# Patient Record
Sex: Female | Born: 1980 | Race: White | Hispanic: No | Marital: Single | Smoking: Former smoker
Health system: Southern US, Community
[De-identification: ages and names within clinical notes are randomized; demographics above are authoritative.]

## PROBLEM LIST (undated history)

## (undated) DIAGNOSIS — T7840XA Allergy, unspecified, initial encounter: Secondary | ICD-10-CM

## (undated) DIAGNOSIS — M4842XA Fatigue fracture of vertebra, cervical region, initial encounter for fracture: Secondary | ICD-10-CM

## (undated) DIAGNOSIS — K219 Gastro-esophageal reflux disease without esophagitis: Secondary | ICD-10-CM

## (undated) DIAGNOSIS — I059 Rheumatic mitral valve disease, unspecified: Secondary | ICD-10-CM

## (undated) DIAGNOSIS — M1990 Unspecified osteoarthritis, unspecified site: Secondary | ICD-10-CM

## (undated) DIAGNOSIS — I351 Nonrheumatic aortic (valve) insufficiency: Secondary | ICD-10-CM

## (undated) HISTORY — DX: Fatigue fracture of vertebra, cervical region, initial encounter for fracture: M48.42XA

## (undated) HISTORY — DX: Unspecified osteoarthritis, unspecified site: M19.90

## (undated) HISTORY — PX: ESSURE TUBAL LIGATION: SUR464

## (undated) HISTORY — DX: Nonrheumatic aortic (valve) insufficiency: I35.1

## (undated) HISTORY — DX: Rheumatic mitral valve disease, unspecified: I05.9

## (undated) HISTORY — DX: Gastro-esophageal reflux disease without esophagitis: K21.9

## (undated) HISTORY — PX: TUBAL LIGATION: SHX77

## (undated) HISTORY — DX: Allergy, unspecified, initial encounter: T78.40XA

---
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
## 2000-05-09 ENCOUNTER — Other Ambulatory Visit: Admission: RE | Admit: 2000-05-09 | Discharge: 2000-05-09 | Payer: Self-pay | Admitting: Obstetrics and Gynecology

## 2000-05-09 ENCOUNTER — Other Ambulatory Visit: Admission: RE | Admit: 2000-05-09 | Discharge: 2000-05-09 | Payer: Self-pay | Admitting: Gynecology

## 2000-12-08 ENCOUNTER — Inpatient Hospital Stay (HOSPITAL_COMMUNITY): Admission: AD | Admit: 2000-12-08 | Discharge: 2000-12-10 | Payer: Self-pay | Admitting: Obstetrics and Gynecology

## 2000-12-08 ENCOUNTER — Encounter (INDEPENDENT_AMBULATORY_CARE_PROVIDER_SITE_OTHER): Payer: Self-pay | Admitting: Specialist

## 2002-01-20 ENCOUNTER — Encounter: Payer: Self-pay | Admitting: Emergency Medicine

## 2002-01-20 ENCOUNTER — Emergency Department (HOSPITAL_COMMUNITY): Admission: EM | Admit: 2002-01-20 | Discharge: 2002-01-20 | Payer: Self-pay | Admitting: Emergency Medicine

## 2007-04-24 ENCOUNTER — Ambulatory Visit (HOSPITAL_COMMUNITY): Admission: RE | Admit: 2007-04-24 | Discharge: 2007-04-24 | Payer: Self-pay | Admitting: Obstetrics & Gynecology

## 2007-07-29 ENCOUNTER — Ambulatory Visit (HOSPITAL_COMMUNITY): Admission: RE | Admit: 2007-07-29 | Discharge: 2007-07-29 | Payer: Self-pay | Admitting: Obstetrics & Gynecology

## 2009-01-26 ENCOUNTER — Emergency Department (HOSPITAL_COMMUNITY): Admission: EM | Admit: 2009-01-26 | Discharge: 2009-01-26 | Payer: Self-pay | Admitting: Family Medicine

## 2009-01-30 DIAGNOSIS — E040 Nontoxic diffuse goiter: Secondary | ICD-10-CM | POA: Insufficient documentation

## 2009-01-30 DIAGNOSIS — E079 Disorder of thyroid, unspecified: Secondary | ICD-10-CM | POA: Insufficient documentation

## 2009-04-07 DIAGNOSIS — J309 Allergic rhinitis, unspecified: Secondary | ICD-10-CM | POA: Insufficient documentation

## 2009-07-28 ENCOUNTER — Emergency Department (HOSPITAL_COMMUNITY): Admission: EM | Admit: 2009-07-28 | Discharge: 2009-07-28 | Payer: Self-pay | Admitting: Family Medicine

## 2009-08-23 ENCOUNTER — Emergency Department (HOSPITAL_BASED_OUTPATIENT_CLINIC_OR_DEPARTMENT_OTHER): Admission: EM | Admit: 2009-08-23 | Discharge: 2009-08-23 | Payer: Self-pay | Admitting: Emergency Medicine

## 2009-10-16 ENCOUNTER — Emergency Department (HOSPITAL_COMMUNITY): Admission: EM | Admit: 2009-10-16 | Discharge: 2009-10-16 | Payer: Self-pay | Admitting: Emergency Medicine

## 2009-10-24 ENCOUNTER — Emergency Department (HOSPITAL_COMMUNITY): Admission: EM | Admit: 2009-10-24 | Discharge: 2009-10-24 | Payer: Self-pay | Admitting: Family Medicine

## 2009-11-29 ENCOUNTER — Ambulatory Visit: Payer: Self-pay | Admitting: Diagnostic Radiology

## 2009-11-29 ENCOUNTER — Emergency Department (HOSPITAL_BASED_OUTPATIENT_CLINIC_OR_DEPARTMENT_OTHER): Admission: EM | Admit: 2009-11-29 | Discharge: 2009-11-29 | Payer: Self-pay | Admitting: Emergency Medicine

## 2009-12-10 ENCOUNTER — Emergency Department (HOSPITAL_BASED_OUTPATIENT_CLINIC_OR_DEPARTMENT_OTHER): Admission: EM | Admit: 2009-12-10 | Discharge: 2009-12-10 | Payer: Self-pay | Admitting: Emergency Medicine

## 2009-12-24 ENCOUNTER — Emergency Department (HOSPITAL_COMMUNITY): Admission: EM | Admit: 2009-12-24 | Discharge: 2009-12-24 | Payer: Self-pay | Admitting: Family Medicine

## 2010-02-04 ENCOUNTER — Emergency Department (HOSPITAL_BASED_OUTPATIENT_CLINIC_OR_DEPARTMENT_OTHER): Admission: EM | Admit: 2010-02-04 | Discharge: 2010-02-04 | Payer: Self-pay | Admitting: Emergency Medicine

## 2010-02-24 ENCOUNTER — Emergency Department (HOSPITAL_COMMUNITY): Admission: EM | Admit: 2010-02-24 | Discharge: 2010-02-24 | Payer: Self-pay | Admitting: Family Medicine

## 2010-03-30 ENCOUNTER — Emergency Department (HOSPITAL_COMMUNITY): Admission: EM | Admit: 2010-03-30 | Discharge: 2010-03-30 | Payer: Self-pay | Admitting: Family Medicine

## 2010-04-03 ENCOUNTER — Emergency Department (HOSPITAL_COMMUNITY): Admission: EM | Admit: 2010-04-03 | Discharge: 2010-04-03 | Payer: Self-pay | Admitting: Family Medicine

## 2010-10-26 ENCOUNTER — Emergency Department (HOSPITAL_COMMUNITY)
Admission: EM | Admit: 2010-10-26 | Discharge: 2010-10-26 | Payer: Self-pay | Source: Home / Self Care | Admitting: Emergency Medicine

## 2010-11-17 ENCOUNTER — Emergency Department (HOSPITAL_BASED_OUTPATIENT_CLINIC_OR_DEPARTMENT_OTHER)
Admission: EM | Admit: 2010-11-17 | Discharge: 2010-11-17 | Payer: Self-pay | Source: Home / Self Care | Admitting: Emergency Medicine

## 2011-01-09 ENCOUNTER — Emergency Department (HOSPITAL_BASED_OUTPATIENT_CLINIC_OR_DEPARTMENT_OTHER)
Admission: EM | Admit: 2011-01-09 | Discharge: 2011-01-09 | Disposition: A | Discharge status: Home / Self Care | Payer: Self-pay | Attending: Emergency Medicine | Admitting: Emergency Medicine

## 2011-01-09 DIAGNOSIS — G8929 Other chronic pain: Secondary | ICD-10-CM | POA: Insufficient documentation

## 2011-01-09 DIAGNOSIS — Y92410 Unspecified street and highway as the place of occurrence of the external cause: Secondary | ICD-10-CM | POA: Insufficient documentation

## 2011-01-09 DIAGNOSIS — S335XXA Sprain of ligaments of lumbar spine, initial encounter: Secondary | ICD-10-CM | POA: Insufficient documentation

## 2011-03-26 NOTE — Op Note (Signed)
Kristina Delacruz, Kristina Delacruz NO.:  [id number]   MEDICAL RECORD NO.:  [id number]          PATIENT TYPE:  AMB   LOCATION:  SDC                           FACILITY:  [facility]   PHYSICIAN:  Roseanna Rainbow, M.D.DATE OF BIRTH:  05-06-1981   DATE OF PROCEDURE:  04/24/2007  DATE OF DISCHARGE:                               OPERATIVE REPORT   PREOPERATIVE DIAGNOSIS:  Multiparity, desires sterilization procedure.   POSTOPERATIVE DIAGNOSIS:  Multiparity, desires sterilization procedure.   PROCEDURE:  Essure placement.   SURGEON:  Jackson-Moore.   ANESTHESIA:  Laryngeal mask airway.   ESTIMATED BLOOD LOSS:  Minimal.   COMPLICATIONS:  None.   PROCEDURE:  The patient was taken to the operating room with an IV  running.  A laryngeal mask airway was placed without difficulty.  She  was then placed in the dorsal lithotomy position and prepped and draped  in the usual sterile fashion.  A bivalve speculum was then placed into  the vagina.  The anterior lip of the cervix was grasped with a single-  tooth tenaculum.  The cervix was then dilated with [facility] dilators.  The  operative scope was then introduced into the cervix and then advanced to  the uterine fundus using normal saline as the distending medium.  The  tubal ostia were visualized bilaterally.  The left ostia was felt to be  the most difficult one to cannulate, and Essure device was then placed  in the left tube without difficulty.  After placement, four coils were  present.  The device was then removed.  The Essure was then placed in  the right tube again without difficulty.  After placement, three coils  were noted at the tubal ostium.  The hysteroscope was then removed.  The  tenaculum was then removed with minimal bleeding noted from the cervix.  At the close of the procedure, the instrument and pack counts were said  to be correct x2.  The patient was taken to the PACU awake and in stable  condition.      Roseanna Rainbow, M.D.  Electronically Signed     LAJ/MEDQ  D:  04/24/2007  T:  04/24/2007  Job:  [id number]

## 2011-03-29 NOTE — Discharge Summary (Signed)
[facility]  Patient:    Kristina Delacruz, Kristina Delacruz                     MRN: [id number] Adm. Date:  [id number] Disc. Date: [id number] Attending:  Conley Simmonds A Dictator:   Leilani Able, P.A.                           Discharge Summary  FINAL DIAGNOSES:              Intrauterine pregnancy at 40 5/[redacted] weeks gestation, pregnancy induced hypertension, moderate meconium stained amniotic fluid, nonreassuring fetal assessment.  PROCEDURE:                    Vacuum assisted vaginal delivery with episiotomy and repair.  SURGEON:                      Dr. Conley Simmonds.  COMPLICATIONS:                None.  HISTORY:                      This 30 year old G1, P0 was admitted at 85 5/[redacted] weeks gestation in active labor.  Patient had been followed for some elevated blood pressures.  Patients nonstress test had been reactive but when she was seen in the office today her nonstress was nonreactive.  A BPP was performed and showed to be 6/8 with an AFI of 5.  Patient was noted to be contracting every five to six minutes at this point and her cervix was only about 1 cm dilated.  She was admitted, underwent AROM, and thick meconium stained fluid was noted.  Patients contraction pattern was not adequate and therefore Pitocin augmentation was begun.  She did start to develop some mild to moderate variable decelerations which were treated with amnio-infusion. Patient progressed to complete and complete and started developing severe variable decelerations.  Because of this decision was made to proceed with a vacuum assisted delivery.  A vacuum extractor was applied.  Patient had delivery with the third contraction.  There was also a midline episiotomy performed.  Patient had the delivery of a 6 pound 5 ounce female infant with Apgars of 7 and 9.  The delivery was performed by Dr. Conley Simmonds.  There was no meconium noted below the cord.  There was a small infarction noted along the  periphery of the placenta and this was sent to pathology.  Patients episiotomy was repaired and the procedure went without complication. Patients postpartum course was benign without significant fever.  She was felt ready for discharge on postpartum day #2.  Was sent home on a regular diet.  Told to decrease activities.  Was given Nu-Iron 150 to take one q.d., Tylox #30 one to two q.4h. as needed for pain.  Told she could use over-the-counter medicine.  Follow-up in four to six weeks. DD:  01/02/01 TD:  01/02/01 Job: [id number]

## 2011-03-29 NOTE — Op Note (Signed)
[facility]  Patient:    Kristina Delacruz, Kristina Delacruz                     MRN: [id number] Proc. Date: 12/08/00 Adm. Date:  [id number] Attending:  Conley Simmonds A                           Operative Report  PREOPERATIVE DIAGNOSES:       1. Intrauterine gestation at 40+5 weeks.                               2. Pregnancy induced hypertension.                               3. Moderate meconium-stained amniotic fluid.                               4. Nonreassuring fetal assessment.  POSTOPERATIVE DIAGNOSES:      1. Intrauterine gestation at 40+5 weeks.                               2. Pregnancy induced hypertension.                               3. Moderate meconium-stained amniotic fluid.                               4. Nonreassuring fetal assessment.  PROCEDURE:                    Vacuum-assisted vaginal delivery with episiotomy                               and repair.  SURGEON:                      Brook A. Edward Jolly, M.D.  ANESTHESIA:                   Epidural, local with 1% lidocaine.  ESTIMATED BLOOD LOSS:         300 cc.  COMPLICATIONS:                None.  INDICATIONS:                  The patient was a 30 year old gravida 1, para 0, admitted at 40+[redacted] weeks gestation in early labor. The patient had been followed for labile hypertension, and she was scheduled for a nonstress test on the day of her admission to the hospital. The nonstress test was noted to be nonreactive, and a biophysical profile was noted to be 6/8 with an amniotic fluid index of 5.0. The patient was noted to be contracting approximately every 5 to 6 minutes, and her cervix was 1 cm dilated and 30% effaced. She was admitted to the hospital for early labor. The patient underwent artificial rupture of membranes when her cervix was 3 to 4 cm dilated, and moderately thick meconium-stained amniotic fluid was noted.  The fetal heart tracing upon admission was  reassuring at this time.  The  patients contractions became inadequate, and Pitocin augmentation was begun. The patient did develop mild to moderate variable decelerations which were treated with an amnioinfusion during the course of her labor. The patient rapidly progressed to complete cervical dilatation and developed severe variable decelerations of the fetal heart rate tracing. At this time, a decision was made to proceed with a vacuum-assisted vaginal delivery.  FINDINGS:                     A viable female infant was delivered at 2008 with a tight nuchal cord x 1. The Apgars were 7 at one minute and 9 at five minutes. No meconium-stained fluid was noted below the infants vocal cords. The placenta was spontaneously delivered and was intact with a normal insertion of a three-vessel cord. There was an infarction noted along the periphery of the placenta which represented less than 1/8 of the placenta. The cord pH was noted to 7.19.  DESCRIPTION OF PROCEDURE:     The patient had a Foley catheter previously placed inside her bladder which had emptied the bladder. This was removed, and the cervix was examined. There was complete dilatation with a vertex in the OA position at the +2 station. A recommendation was made to place the Mityvac to assist with delivery due to the severe nature of the variable decelerations. The Mityvac was applied, and proper pressure was applied. Local 1% lidocaine was administered to the perineum and a midline episiotomy was performed. A viable female was then delivered with the third contraction. The nuchal cord was reduced. The nares and mouth were suctioned with the DeLee suction, and the remainder of the infant was delivered.  The cord was doubly clamped and cut, and the infant was carried over to the awaiting pediatricians. No gross abnormalities were appreciated.  The placenta was spontaneously delivered at this time. The cervix and vagina were examined and there was no evidence of any  lacerations. The episiotomy was repaired in standard fashion with 2-0 Vicryl. The patient did receive Pitocin 20 units intravenously after delivery of the placenta. There were no complications to the procedure. All needle, instrument, and sponge counts were correct. DD:  12/08/00 TD:  12/08/00 Job: [id number]

## 2011-05-21 ENCOUNTER — Encounter: Payer: Self-pay | Admitting: *Deleted

## 2011-05-21 ENCOUNTER — Emergency Department (HOSPITAL_BASED_OUTPATIENT_CLINIC_OR_DEPARTMENT_OTHER)
Admission: EM | Admit: 2011-05-21 | Discharge: 2011-05-21 | Disposition: A | Discharge status: Home / Self Care | Payer: Self-pay | Attending: Emergency Medicine | Admitting: Emergency Medicine

## 2011-05-21 DIAGNOSIS — R21 Rash and other nonspecific skin eruption: Secondary | ICD-10-CM

## 2011-05-21 MED ORDER — PERMETHRIN 5 % EX CREA: TOPICAL_CREAM | CUTANEOUS | Status: AC

## 2011-05-21 MED ORDER — PREDNISONE 20 MG PO TABS: 40.0000 mg | ORAL_TABLET | Freq: Every day | ORAL | Status: AC

## 2011-05-21 NOTE — ED Provider Notes (Signed)
History     Chief Complaint  Patient presents with  . Rash   Patient is a 30 y.o. female presenting with rash. The history is provided by the patient.  Rash  This is a new problem. Episode onset: 3 weeks ago. The problem has not changed since onset.The problem is associated with an unknown factor. There has been no fever. The rash is present on the right arm and left arm. The pain is at a severity of 0/10. The patient is experiencing no pain. Associated symptoms include itching. Pertinent negatives include no blisters, no pain and no weeping. She has tried antihistamines and OTC analgesics for the symptoms. The treatment provided no relief. Risk factors: no new exposure except for a new dog.    History reviewed. No pertinent past medical history.  Past Surgical History  Procedure Date  . Tubal ligation     No family history on file.  History  Substance Use Topics  . Smoking status: Former Games developer  . Smokeless tobacco: Never Used  . Alcohol Use: No    OB History    Grav Para Term Preterm Abortions TAB SAB Ect Mult Living                  Review of Systems  Skin: Positive for itching and rash.  All other systems reviewed and are negative.    Physical Exam  BP 115/73  Pulse 107  Temp(Src) 98.2 F (36.8 C) (Oral)  Resp 22  SpO2 100%  Physical Exam  Constitutional: She appears well-developed and well-nourished. No distress.  HENT:  Head: Normocephalic and atraumatic.  Eyes: Pupils are equal, round, and reactive to light.  Skin: Skin is warm, dry and intact. Rash noted. Rash is maculopapular.       Circular lesions diffusely on bilateral upper ext with excoriation but without any drainage or induration.  No involvement of the web spaces or axilla or trunk    ED Course  Procedures  MDM Pt with rash most consistent with mite or other contact reaction. Only on the upper arms and no signs of bacterial infection.  Will give steroids and continue benadryl.  Will have  use premethrin.     Gwyneth Sprout, MD 05/21/11 [phone]

## 2011-05-21 NOTE — ED Notes (Signed)
Rash over her body x 3 weeks. Started after getting a new puppy. Itching has gotten worse with time.

## 2011-08-29 LAB — CBC
HCT: 39.6
Hemoglobin: 13.6
RBC: 4.44
RDW: 12.4

## 2012-06-09 ENCOUNTER — Emergency Department (HOSPITAL_COMMUNITY)
Admission: EM | Admit: 2012-06-09 | Discharge: 2012-06-09 | Disposition: A | Discharge status: Home / Self Care | Payer: Self-pay | Attending: Emergency Medicine | Admitting: Emergency Medicine

## 2012-06-09 ENCOUNTER — Encounter (HOSPITAL_COMMUNITY): Payer: Self-pay | Admitting: *Deleted

## 2012-06-09 DIAGNOSIS — H9209 Otalgia, unspecified ear: Secondary | ICD-10-CM | POA: Insufficient documentation

## 2012-06-09 DIAGNOSIS — H9202 Otalgia, left ear: Secondary | ICD-10-CM

## 2012-06-09 DIAGNOSIS — Z87891 Personal history of nicotine dependence: Secondary | ICD-10-CM | POA: Insufficient documentation

## 2012-06-09 MED ORDER — MECLIZINE HCL 50 MG PO TABS: 25.0000 mg | ORAL_TABLET | Freq: Three times a day (TID) | ORAL | Status: AC | PRN

## 2012-06-09 MED ORDER — AMOXICILLIN 250 MG PO CAPS: 500.0000 mg | ORAL_CAPSULE | Freq: Two times a day (BID) | ORAL | Status: AC

## 2012-06-09 NOTE — ED Provider Notes (Signed)
History     CSN: [id number]  Arrival date & time 06/09/12  1540   First MD Initiated Contact with Patient 06/09/12 1623      Chief Complaint  Patient presents with  . Otalgia    LT side x 2 weeks.  Has been using ear drops w/o relief  . Ear Fullness    LT side    (Consider location/radiation/quality/duration/timing/severity/associated sxs/prior treatment) HPI  31 year old female presents complaining of ear pain. Patient reports she is having pain to the left ear for 2 weeks. She described a dull sensation to her left ear, with decreased hearing. Symptom is persistent, unrelieved with over-the-counter ear wax removal solution, or ear drops for  pain. She noticed improvement when she lays on the affected side. She does endorse dizziness for the past several days. She denies fever, chills, headache, neck pain, sneezing, coughing, sore throat, or rash. She denies any recent trauma. Does admits to swimming in pool twice but her sxs has started before that.    No past medical history on file.  Past Surgical History  Procedure Date  . Tubal ligation     No family history on file.  History  Substance Use Topics  . Smoking status: Former Games developer  . Smokeless tobacco: Never Used  . Alcohol Use: No    OB History    Grav Para Term Preterm Abortions TAB SAB Ect Mult Living                  Review of Systems  Constitutional: Negative for fever.  HENT: Positive for hearing loss and ear pain. Negative for congestion, sore throat, rhinorrhea, sneezing, neck pain, postnasal drip, sinus pressure, tinnitus and ear discharge.   Skin: Negative for rash.  Neurological: Negative for numbness.  All other systems reviewed and are negative.    Allergies  Review of patient's allergies indicates no known allergies.  Home Medications  No current outpatient prescriptions on file.  There were no vitals taken for this visit.  Physical Exam  Nursing note and vitals  reviewed. Constitutional: She is oriented to person, place, and time. She appears well-developed and well-nourished.  HENT:  Right Ear: External ear and ear canal normal. No drainage.  Left Ear: External ear and ear canal normal. No drainage.  Ears:  Eyes: Conjunctivae are normal.       No nystagmus.   Neck: Normal range of motion. Neck supple.  Musculoskeletal: Normal range of motion.  Lymphadenopathy:    She has no cervical adenopathy.  Neurological: She is alert and oriented to person, place, and time. No cranial nerve deficit. Coordination normal.  Skin: Skin is warm. No rash noted.  Psychiatric: She has a normal mood and affect.    ED Course  Procedures (including critical care time)  Labs Reviewed - No data to display No results found.   No diagnosis found.  1. Ear pain, left  MDM  Cerumen impaction on both ear L>R.  Sxs unrelieved with OTC wax removal and ear drops.  Due to duration of 2 weeks, will prescribe abx and referral to ENT.  I do not believe ear irrigation at this time is beneficial.    Doubt otitis externa.    BP 121/91  Pulse 74  Temp 97.7 F (36.5 C) (Oral)  Resp 14  SpO2 100%     Fayrene Helper, PA-C 06/09/12 1636

## 2012-06-09 NOTE — ED Notes (Signed)
Pt reports L ear pain x2weeks. Has tried ear drops and mineral oil without relief. Reports unable to hear out of L ear. Pain has become pressure. Worse with movement, makes pt dizzy.

## 2012-06-09 NOTE — ED Provider Notes (Signed)
Medical screening examination/treatment/procedure(s) were performed by non-physician practitioner and as supervising physician I was immediately available for consultation/collaboration.   Blimy Napoleon Y. Albion Weatherholtz, MD 06/09/12 2356 

## 2012-06-11 ENCOUNTER — Emergency Department (HOSPITAL_BASED_OUTPATIENT_CLINIC_OR_DEPARTMENT_OTHER)
Admission: EM | Admit: 2012-06-11 | Discharge: 2012-06-11 | Disposition: A | Discharge status: Home / Self Care | Payer: Self-pay | Attending: Emergency Medicine | Admitting: Emergency Medicine

## 2012-06-11 ENCOUNTER — Encounter (HOSPITAL_BASED_OUTPATIENT_CLINIC_OR_DEPARTMENT_OTHER): Payer: Self-pay

## 2012-06-11 DIAGNOSIS — H6120 Impacted cerumen, unspecified ear: Secondary | ICD-10-CM | POA: Insufficient documentation

## 2012-06-11 MED ORDER — CIPROFLOXACIN-DEXAMETHASONE 0.3-0.1 % OT SUSP: 4.0000 [drp] | Freq: Two times a day (BID) | OTIC | Status: AC

## 2012-06-11 NOTE — ED Notes (Signed)
MD at bedside. 

## 2012-06-11 NOTE — ED Provider Notes (Signed)
History     CSN: [id number]  Arrival date & time 06/11/12  0840   First MD Initiated Contact with Patient 06/11/12 [phone]      Chief Complaint  Patient presents with  . Otalgia    (Consider location/radiation/quality/duration/timing/severity/associated sxs/prior treatment) HPI Comments: Left ear pain with fullness and decreased hearing for the past 2 weeks. Seen 2 days ago [facility] and put on amoxicillin. Denies fever, vomiting, dizziness, lightheadedness, chest pain or shortness of breath. Using debrox without relief.   The history is provided by the patient.    History reviewed. No pertinent past medical history.  Past Surgical History  Procedure Date  . Tubal ligation     No family history on file.  History  Substance Use Topics  . Smoking status: Former Games developer  . Smokeless tobacco: Never Used  . Alcohol Use: No    OB History    Grav Para Term Preterm Abortions TAB SAB Ect Mult Living                  Review of Systems  Constitutional: Negative for fever, activity change and appetite change.  HENT: Positive for hearing loss and ear pain. Negative for congestion, sore throat and rhinorrhea.   Respiratory: Negative for cough and shortness of breath.   Cardiovascular: Negative for chest pain.  Gastrointestinal: Negative for nausea, vomiting and abdominal pain.  Genitourinary: Negative for dysuria and hematuria.  Musculoskeletal: Negative for back pain.    Allergies  Review of patient's allergies indicates no known allergies.  Home Medications   Current Outpatient Rx  Name Route Sig Dispense Refill  . AMOXICILLIN 250 MG PO CAPS Oral Take 2 capsules (500 mg total) by mouth 2 (two) times daily. 28 capsule 0  . CARBAMIDE PEROXIDE 6.5 % OT SOLN Left Ear Place 5-10 drops into the left ear 2 (two) times daily.    Marland Kitchen HOMEOPATHIC PRODUCTS OT SOLN Left Ear Place 3-4 drops into the left ear 4 (four) times daily as needed. Ear pain. Homeopathic ear pain drops. Marland Kitchen    Marti Sleigh HCL 50 MG PO TABS Oral Take 0.5 tablets (25 mg total) by mouth 3 (three) times daily as needed for dizziness. 15 tablet 0  . MINERAL OIL PO OIL Left Ear Place 30 mLs into the left ear daily as needed. Ear pain.      BP 120/86  Pulse 88  Temp 97.6 F (36.4 C) (Oral)  Resp 16  Ht 5' (1.524 m)  Wt 115 lb (52.164 kg)  BMI 22.46 kg/m2  SpO2 100%  Physical Exam  Constitutional: She is oriented to person, place, and time. She appears well-developed and well-nourished. No distress.  HENT:  Head: Normocephalic and atraumatic.  Mouth/Throat: Oropharynx is clear and moist. No oropharyngeal exudate.       Bilateral cerumen impaction, TMs not visible. No tragus or mastoid pain  Eyes: Conjunctivae and EOM are normal. Pupils are equal, round, and reactive to light.  Neck: Normal range of motion. Neck supple.  Cardiovascular: Normal rate, regular rhythm and normal heart sounds.   No murmur heard. Pulmonary/Chest: Effort normal and breath sounds normal. No respiratory distress.  Abdominal: Soft. There is no tenderness. There is no rebound and no guarding.  Neurological: She is alert and oriented to person, place, and time. No cranial nerve deficit. Coordination normal.       5 out of 5 strength throughout, cranial nerves 3-12 intact, no nystagmus, no ataxia finger to nose, visual fields  full to confrontation  Skin: Skin is warm.    ED Course  EAR CERUMEN REMOVAL Date/Time: 06/11/2012 9:28 AM Performed by: Glynn Octave Authorized by: Glynn Octave Consent: Verbal consent obtained. Risks and benefits: risks, benefits and alternatives were discussed Consent given by: patient Patient identity confirmed: verbally with patient Local anesthetic: none Location details: right ear Procedure type: curette Patient sedated: no Patient tolerance: Patient tolerated the procedure well with no immediate complications.   (including critical care time)  Labs Reviewed - No data to  display No results found.   No diagnosis found.    MDM  Cerumen impaction. No signs of infection.  Ear wax removal performed by nursing staff with irrigation. Some trauma noted to ear canal bilaterally but TMs intact.  Residual cerumen on R removed by myself with curette.  Patient reports improvement in pain and hearing. D/c PO antibiotics. Add topical abx, followup ENT.       Glynn Octave, MD 06/11/12 [phone]

## 2012-06-11 NOTE — ED Notes (Signed)
Pt reports left ear pain.  She was seen Monday at [facility] and started on PO Abx.

## 2012-08-30 ENCOUNTER — Encounter (HOSPITAL_COMMUNITY): Payer: Self-pay | Admitting: Emergency Medicine

## 2012-08-30 ENCOUNTER — Emergency Department (INDEPENDENT_AMBULATORY_CARE_PROVIDER_SITE_OTHER)
Admission: EM | Admit: 2012-08-30 | Discharge: 2012-08-30 | Disposition: A | Discharge status: Home / Self Care | Payer: Self-pay | Source: Home / Self Care | Attending: Family Medicine | Admitting: Family Medicine

## 2012-08-30 DIAGNOSIS — H109 Unspecified conjunctivitis: Secondary | ICD-10-CM

## 2012-08-30 MED ORDER — MOXIFLOXACIN HCL 0.5 % OP SOLN: 1.0000 [drp] | Freq: Three times a day (TID) | OPHTHALMIC | Status: DC

## 2012-08-30 NOTE — ED Provider Notes (Signed)
History     CSN: [id number]  Arrival date & time 08/30/12  1152   First MD Initiated Contact with Patient 08/30/12 1220      Chief Complaint  Patient presents with  . Eye Pain    (Consider location/radiation/quality/duration/timing/severity/associated sxs/prior treatment) Patient is a 31 y.o. female presenting with eye pain. The history is provided by the patient.  Eye Pain This is a new problem. The current episode started 2 days ago. The problem has not changed since onset.Associated symptoms comments: Tearing, no photophobia, wears contacts, recently changed 4 days prior.Marland Kitchen    History reviewed. No pertinent past medical history.  Past Surgical History  Procedure Date  . Tubal ligation     No family history on file.  History  Substance Use Topics  . Smoking status: Former Games developer  . Smokeless tobacco: Never Used  . Alcohol Use: No    OB History    Grav Para Term Preterm Abortions TAB SAB Ect Mult Living                  Review of Systems  Constitutional: Negative.   HENT: Negative.   Eyes: Positive for pain and redness. Negative for photophobia, discharge, itching and visual disturbance.    Allergies  Review of patient's allergies indicates no known allergies.  Home Medications   Current Outpatient Rx  Name Route Sig Dispense Refill  . DIPHENHYDRAMINE HCL 25 MG PO TABS Oral Take 25 mg by mouth every 6 (six) hours as needed.    . CARBAMIDE PEROXIDE 6.5 % OT SOLN Left Ear Place 5-10 drops into the left ear 2 (two) times daily.    Marland Kitchen HOMEOPATHIC PRODUCTS OT SOLN Left Ear Place 3-4 drops into the left ear 4 (four) times daily as needed. Ear pain. Homeopathic ear pain drops. .    . MINERAL OIL PO OIL Left Ear Place 30 mLs into the left ear daily as needed. Ear pain.    Marland Kitchen MOXIFLOXACIN HCL 0.5 % OP SOLN Left Eye Place 1 drop into the left eye 3 (three) times daily. For 5 days. 3 mL 0    BP 134/94  Pulse 90  Temp 99.2 F (37.3 C) (Oral)  Resp 18  SpO2  100%  Physical Exam  Nursing note and vitals reviewed. Constitutional: She is oriented to person, place, and time. She appears well-developed and well-nourished.  HENT:  Head: Normocephalic.  Right Ear: External ear normal.  Left Ear: External ear normal.  Mouth/Throat: Oropharynx is clear and moist.  Eyes: EOM and lids are normal. Pupils are equal, round, and reactive to light. No foreign bodies found. Right eye exhibits no discharge. Left eye exhibits no discharge. Right conjunctiva is not injected. Right conjunctiva has no hemorrhage. Left conjunctiva is injected. Left conjunctiva has no hemorrhage.  Fundoscopic exam:      The left eye shows no arteriolar narrowing, no AV nicking, no exudate and no hemorrhage. The left eye shows venous pulsations. Slit lamp exam:      The left eye shows no corneal abrasion, no corneal flare, no corneal ulcer, no foreign body, no hyphema and no fluorescein uptake.  Neck: Normal range of motion. Neck supple.  Lymphadenopathy:    She has no cervical adenopathy.  Neurological: She is alert and oriented to person, place, and time.  Skin: Skin is warm and dry.    ED Course  Procedures (including critical care time)  Labs Reviewed - No data to display No results found.  1. Conjunctivitis of left eye       MDM          Linna Hoff, MD 08/30/12 1334

## 2012-08-30 NOTE — ED Notes (Signed)
Eye irritation, swelling, pain.  Onset Friday of swelling , thought this was related to allergies.  Patient has intermittently attempted use of contacts, but results in eye symptoms worsening and patient removes contact.  Left eye with redness, pain, swollen lids, vision worse than usual without corrective lenses.

## 2013-05-10 ENCOUNTER — Emergency Department (INDEPENDENT_AMBULATORY_CARE_PROVIDER_SITE_OTHER)
Admission: EM | Admit: 2013-05-10 | Discharge: 2013-05-10 | Disposition: A | Discharge status: Home / Self Care | Payer: Self-pay | Source: Home / Self Care | Attending: Emergency Medicine | Admitting: Emergency Medicine

## 2013-05-10 ENCOUNTER — Encounter (HOSPITAL_COMMUNITY): Payer: Self-pay | Admitting: Emergency Medicine

## 2013-05-10 DIAGNOSIS — N390 Urinary tract infection, site not specified: Secondary | ICD-10-CM

## 2013-05-10 DIAGNOSIS — H9202 Otalgia, left ear: Secondary | ICD-10-CM

## 2013-05-10 DIAGNOSIS — H9209 Otalgia, unspecified ear: Secondary | ICD-10-CM

## 2013-05-10 LAB — POCT URINALYSIS DIP (DEVICE)
Bilirubin Urine: NEGATIVE
Glucose, UA: NEGATIVE mg/dL
Nitrite: NEGATIVE
Specific Gravity, Urine: 1.01 (ref 1.005–1.030)
pH: 6 (ref 5.0–8.0)

## 2013-05-10 MED ORDER — CEPHALEXIN 500 MG PO CAPS: 500.0000 mg | ORAL_CAPSULE | Freq: Three times a day (TID) | ORAL | Status: DC

## 2013-05-10 NOTE — ED Provider Notes (Signed)
Goes up and goes and he'll hear well  History    CSN: [id number] Arrival date & time 05/10/13  1154  First MD Initiated Contact with Patient 05/10/13 1307     Chief Complaint  Patient presents with  . Urinary Tract Infection  . Otalgia   (Consider location/radiation/quality/duration/timing/severity/associated sxs/prior Treatment) HPI Comments: Patient presents urgent care describing pressure burning with urination since Thursday. She denies any fevers, nausea vomiting pelvic pain or flank pain. She has not taken anything over-the-counter for her symptoms. " It burns to pee"...    Patient also describes that for about a year she has been having intermittent left-sided ear pains that come and go at times for weeks at times for days that suddenly associated with loss of hearing. She has not seek medical attention for it she has not tried any medicines over-the-counter. Denies nausea, vertigo, headaches.   " I came to this urgent care Center about a year ago and a irrigated my ear is a headache ear wax in my problems diet happening after that".....  Patient is a 32 y.o. female presenting with urinary tract infection and ear pain. The history is provided by the patient.  Urinary Tract Infection This is a new problem. The current episode started more than 2 days ago. The problem occurs constantly. The problem has not changed since onset.Pertinent negatives include no abdominal pain and no shortness of breath. Exacerbated by: urination. Nothing relieves the symptoms. The treatment provided no relief.  Otalgia Associated symptoms: no abdominal pain, no ear discharge and no rash    No past medical history on file. Past Surgical History  Procedure Laterality Date  . Tubal ligation     No family history on file. History  Substance Use Topics  . Smoking status: Former Games developer  . Smokeless tobacco: Never Used  . Alcohol Use: No   OB History   Grav Para Term Preterm Abortions TAB SAB Ect  Mult Living                 Review of Systems  Constitutional: Negative for activity change and appetite change.  HENT: Positive for ear pain. Negative for neck stiffness and ear discharge.   Respiratory: Negative for shortness of breath.   Gastrointestinal: Negative for abdominal pain.  Skin: Negative for pallor, rash and wound.    Allergies  Review of patient's allergies indicates no known allergies.  Home Medications   Current Outpatient Rx  Name  Route  Sig  Dispense  Refill  . carbamide peroxide (DEBROX) 6.5 % otic solution   Left Ear   Place 5-10 drops into the left ear 2 (two) times daily.         . diphenhydrAMINE (BENADRYL) 25 MG tablet   Oral   Take 25 mg by mouth every 6 (six) hours as needed.         . Homeopathic Products SOLN   Left Ear   Place 3-4 drops into the left ear 4 (four) times daily as needed. Ear pain. Homeopathic ear pain drops. .         . mineral oil liquid   Left Ear   Place 30 mLs into the left ear daily as needed. Ear pain.         Marland Kitchen moxifloxacin (VIGAMOX) 0.5 % ophthalmic solution   Left Eye   Place 1 drop into the left eye 3 (three) times daily. For 5 days.   3 mL   0    BP  138/89  Pulse 107  Resp 16  SpO2 99% Physical Exam  Nursing note and vitals reviewed. Constitutional: She appears well-developed and well-nourished.  Non-toxic appearance.  HENT:  Head: Normocephalic.  Right Ear: Hearing, tympanic membrane and ear canal normal. No tenderness. No middle ear effusion.  Left Ear: Hearing, tympanic membrane and ear canal normal. No tenderness.  No middle ear effusion.  Ears:  Mouth/Throat: Uvula is midline. No oropharyngeal exudate.  Eyes: Conjunctivae are normal. Right eye exhibits no discharge. Left eye exhibits no discharge. No scleral icterus.  Neck: Neck supple.  Abdominal: There is no tenderness.  Lymphadenopathy:    She has no cervical adenopathy.  Neurological: She is alert.  Skin: No rash noted. No  erythema. No pallor.    ED Course  Procedures (including critical care time) Labs Reviewed  POCT URINALYSIS DIP (DEVICE) - Abnormal; Notable for the following:    Hgb urine dipstick MODERATE (*)    Leukocytes, UA SMALL (*)    All other components within normal limits  URINE CULTURE  POCT PREGNANCY, URINE   No results found. 1. Urinary tract infection   2. Otalgia of left ear     MDM  Problem #1 uncomplicated urinary tract infection. patient looks comfortable. He urine culture has been ordered patient will be started on Keflex for 7 days.   Problem #2 chronic left ear pain (recurrent- intermittent associated intermittent hearing loss for one year.  Ear exam was unremarkable. Patient has been recommended to followup with the ENT clinic/ written information provided for local clinic. Patient is asymptomatic at time of exam.   Jimmie Molly, MD 05/10/13 1335

## 2013-05-10 NOTE — ED Notes (Signed)
Complaining of ear ache and uti.   States left ear has been bothering for a month now.  Symptom is only pain.     Complaining of urinary problems since Thursday last week.  Symptom is burning.

## 2013-05-11 LAB — URINE CULTURE
Colony Count: NO GROWTH
Culture: NO GROWTH

## 2013-05-14 ENCOUNTER — Emergency Department (INDEPENDENT_AMBULATORY_CARE_PROVIDER_SITE_OTHER)
Admission: EM | Admit: 2013-05-14 | Discharge: 2013-05-14 | Disposition: A | Discharge status: Home / Self Care | Payer: Self-pay | Source: Home / Self Care | Attending: Family Medicine | Admitting: Family Medicine

## 2013-05-14 ENCOUNTER — Encounter (HOSPITAL_COMMUNITY): Payer: Self-pay | Admitting: Emergency Medicine

## 2013-05-14 ENCOUNTER — Other Ambulatory Visit (HOSPITAL_COMMUNITY)
Admission: RE | Admit: 2013-05-14 | Discharge: 2013-05-14 | Disposition: A | Discharge status: Home / Self Care | Payer: Self-pay | Source: Ambulatory Visit | Attending: Family Medicine | Admitting: Family Medicine

## 2013-05-14 DIAGNOSIS — J310 Chronic rhinitis: Secondary | ICD-10-CM

## 2013-05-14 DIAGNOSIS — N760 Acute vaginitis: Secondary | ICD-10-CM | POA: Insufficient documentation

## 2013-05-14 DIAGNOSIS — Z113 Encounter for screening for infections with a predominantly sexual mode of transmission: Secondary | ICD-10-CM | POA: Insufficient documentation

## 2013-05-14 MED ORDER — IBUPROFEN 600 MG PO TABS: 600.0000 mg | ORAL_TABLET | Freq: Three times a day (TID) | ORAL | Status: DC | PRN

## 2013-05-14 MED ORDER — TRAMADOL HCL 50 MG PO TABS: 50.0000 mg | ORAL_TABLET | Freq: Four times a day (QID) | ORAL | Status: DC | PRN

## 2013-05-14 MED ORDER — FLUCONAZOLE 150 MG PO TABS: ORAL_TABLET | ORAL | Status: DC

## 2013-05-14 MED ORDER — PREDNISONE 20 MG PO TABS: ORAL_TABLET | ORAL | Status: DC

## 2013-05-14 MED ORDER — CETIRIZINE-PSEUDOEPHEDRINE ER 5-120 MG PO TB12: 1.0000 | ORAL_TABLET | Freq: Two times a day (BID) | ORAL | Status: DC | PRN

## 2013-05-14 NOTE — ED Provider Notes (Signed)
History    CSN: [id number] Arrival date & time 05/14/13  1191  First MD Initiated Contact with Patient 05/14/13 0920     Chief Complaint  Patient presents with  . Headache   (Consider location/radiation/quality/duration/timing/severity/associated sxs/prior Treatment) HPI Comments: 32 year old female G1 P1 here complaining of burning on urination and headache for about 2 weeks. She was seen here on June 30 with dysuria. Was treated with Keflex and had a urine culture with no growth. Patient states her symptoms.improved but now she has observed blisters in her vaginal and vulvar area and the discomfort on urination return. She denies fever or chills. She denies prior history of herpes. She denies pelvic pain or vaginal discharge other than what is usual for her. She had a bilateral tubal ligation for contraception in 2007. Denies nausea vomiting or diarrhea. Patient reports she has chronic allergic rhinitis and has had intermittent headaches and migraines in the past. Headaches are worse in the last week. Almost daily and lasting for most part of the day She's taking her antihistamine daily but still feels congested and reports sinus pressure. Denies rhinorrhea or cough. No shortness of breath or chest pain. Appetite and energy level are good.   History reviewed. No pertinent past medical history. Past Surgical History  Procedure Laterality Date  . Tubal ligation     No family history on file. History  Substance Use Topics  . Smoking status: Former Games developer  . Smokeless tobacco: Never Used  . Alcohol Use: Yes   OB History   Grav Para Term Preterm Abortions TAB SAB Ect Mult Living                 Review of Systems  Constitutional: Negative for fever, chills, activity change and appetite change.  HENT: Positive for congestion, sneezing, postnasal drip and sinus pressure. Negative for ear pain, sore throat and trouble swallowing.   Eyes: Negative for pain and discharge.  Respiratory:  Negative for cough, shortness of breath and wheezing.   Genitourinary: Positive for genital sores. Negative for hematuria, flank pain, vaginal bleeding and pelvic pain.       Burning in vulvar area after voiding as per HPI  Musculoskeletal: Negative for myalgias and arthralgias.  Skin: Negative for rash.  Neurological: Positive for headaches. Negative for dizziness, tremors, seizures, syncope, weakness and numbness.    Allergies  Review of patient's allergies indicates no known allergies.  Home Medications   Current Outpatient Rx  Name  Route  Sig  Dispense  Refill  . cetirizine-pseudoephedrine (ZYRTEC-D) 5-120 MG per tablet   Oral   Take 1 tablet by mouth 2 (two) times daily as needed for allergies or rhinitis.   30 tablet   0   . fluconazole (DIFLUCAN) 150 MG tablet      1 tab by mouth q. 72 hours x4   4 tablet   0   . Homeopathic Products SOLN   Left Ear   Place 3-4 drops into the left ear 4 (four) times daily as needed. Ear pain. Homeopathic ear pain drops. .         . ibuprofen (ADVIL,MOTRIN) 600 MG tablet   Oral   Take 1 tablet (600 mg total) by mouth every 8 (eight) hours as needed for pain.   30 tablet   0   . predniSONE (DELTASONE) 20 MG tablet      2 tabs by mouth daily for 5 days   10 tablet   0   .  traMADol (ULTRAM) 50 MG tablet   Oral   Take 1 tablet (50 mg total) by mouth every 6 (six) hours as needed for pain.   15 tablet   0    BP 109/68  Pulse 90  Temp(Src) 98.3 F (36.8 C) (Oral)  Resp 16  SpO2 100%  LMP 05/07/2013 Physical Exam  Nursing note and vitals reviewed. Constitutional: She is oriented to person, place, and time. She appears well-developed and well-nourished. No distress.  HENT:  Head: Normocephalic and atraumatic.  Nasal Congestion with erythema and swelling of nasal turbinates, no rhinorrhea. Nasal voice. Clear Post nasal drip. Bilateral maxillary sinus tenderness with palpation. no pharyngeal erythema no exudates. No uvula  deviation. No trismus. TM's clear fluid behind no dull erythema swelling or bulging.  Eyes: EOM are normal. Pupils are equal, round, and reactive to light. Right eye exhibits no discharge. Left eye exhibits no discharge. No scleral icterus.  Bilateral mild conjunctival erythema.   Neck: Neck supple.  Cardiovascular: Normal heart sounds.   Pulmonary/Chest: Effort normal and breath sounds normal.  Abdominal: Soft. Bowel sounds are normal. She exhibits no distension and no mass. There is no tenderness. There is no rebound and no guarding.  Genitourinary: There is rash on the right labia. There is rash and lesion on the left labia. There is erythema around the vagina. Vaginal discharge found.  Lymphadenopathy:    She has no cervical adenopathy.       Right: No inguinal adenopathy present.       Left: No inguinal adenopathy present.  Neurological: She is alert and oriented to person, place, and time.  Skin: She is not diaphoretic.  See GU exam    ED Course  Procedures (including critical care time) Labs Reviewed  HERPES SIMPLEX VIRUS CULTURE  CERVICOVAGINAL ANCILLARY ONLY   No results found. 1. Chronic rhinitis   2. Vaginitis     MDM  #1 vaginitis: Treated for possible Candida infection with Diflucan. HSV, GC Chlamydia and affirm test pending at the time of discharge. #2 acute and chronic rhinosinusitis: Impress noninfectious currently. Prescribe prednisone as patient does not have insurance to cover for a nasal steroid. Continue cetirizine with pseudoephedrine. Also prescribed ibuprofen and tramadol for pain Supportive care and red flags that should prompt return to medical attention discussed with patient and provided in writing.  Sharin Grave, MD 05/15/13 1624

## 2013-05-14 NOTE — ED Notes (Signed)
Printed urine culture results form Monday and gave to dr Alfonse Ras with explanation, did not obtain urine today.  Patient instructed to put on gown.

## 2013-05-14 NOTE — ED Notes (Signed)
C/o headache and painful urination- same as on Monday visit 6/30.  During the week felt like painful urination resolved, but today this symptoms has reoccurred.  Patient new symptom of blisters on labia per patient.

## 2013-05-18 NOTE — ED Notes (Signed)
GC/Chlamydia neg., Affirm: Candida and Gardnerella neg., Trich pos., Herpes culture: Herpes Simplex Type 2 detected.  Message sent to Dr. Tressia Danas for orders. Vassie Moselle 05/18/2013

## 2013-05-20 MED ORDER — ACYCLOVIR 400 MG PO TABS: 400.0000 mg | ORAL_TABLET | Freq: Three times a day (TID) | ORAL | Status: DC

## 2013-05-20 MED ORDER — METRONIDAZOLE 500 MG PO TABS: 500.0000 mg | ORAL_TABLET | Freq: Two times a day (BID) | ORAL | Status: DC

## 2013-05-21 ENCOUNTER — Telehealth (HOSPITAL_COMMUNITY): Payer: Self-pay | Admitting: *Deleted

## 2013-05-21 NOTE — ED Notes (Addendum)
7/10 Dr. Alfonse Ras did order for Acyclovir and Flagyl. 7/11  I called pt. and left a message to call.  Call 1.  Pt. called back 15 min. later.  Pt. verified x 2 and given results.  Pt. told she needs Acyclovir for Herpes and Flagyl for Trich and the Rx.'s are at the [facility] on [address].   Pt. instructed to no alcohol while taking the Flagyl, to notify her partner to be treated with Flagyl, no sex until you have finished your medication and your partner has been treated and to practice safe sex,( for the Trich.).  For the Herpes:  Pt. instructed to notify her partner. You can pass the virus even when you don't have an outbreak, so always practice safe sex, and get treated for each outbreak with Acyclovir or Valtrex. You may want to get an OB-GYN doctor or PCP who can call in a prescription for you when you have an outbreak or give you a 1 yr Rx. to fill as needed. They can also give you a Rx. if you are having frequent outbreaks.  Pt. voiced understanding. Kristina Delacruz 05/21/2013

## 2013-10-21 ENCOUNTER — Encounter (HOSPITAL_COMMUNITY): Payer: Self-pay | Admitting: Emergency Medicine

## 2013-10-21 ENCOUNTER — Emergency Department (INDEPENDENT_AMBULATORY_CARE_PROVIDER_SITE_OTHER)
Admission: EM | Admit: 2013-10-21 | Discharge: 2013-10-21 | Disposition: A | Discharge status: Home / Self Care | Payer: Self-pay | Source: Home / Self Care

## 2013-10-21 DIAGNOSIS — A6000 Herpesviral infection of urogenital system, unspecified: Secondary | ICD-10-CM

## 2013-10-21 MED ORDER — ACYCLOVIR 800 MG PO TABS: 800.0000 mg | ORAL_TABLET | Freq: Two times a day (BID) | ORAL | Status: DC

## 2013-10-21 NOTE — ED Notes (Signed)
Herpes .  Reports she needs med refill

## 2013-10-21 NOTE — ED Provider Notes (Signed)
Medical screening examination/treatment/procedure(s) were performed by non-physician practitioner and as supervising physician I was immediately available for consultation/collaboration.  Leslee Home, M.D.  Reuben Likes, MD 10/21/13 [phone]

## 2013-10-21 NOTE — ED Provider Notes (Signed)
CSN: [id number]     Arrival date & time 10/21/13  1100 History   First MD Initiated Contact with Patient 10/21/13 1123     Chief Complaint  Patient presents with  . Herpes Zoster   (Consider location/radiation/quality/duration/timing/severity/associated sxs/prior Treatment) HPI Comments: 32 year old female has a history of genital herpes which was diagnosed here at the urgent care a few months ago. She states she is having outbreak nail and describes the lesions as vesicular and red. They are tender, painful and burning. The first one started approximately one week ago. She has irregular cycles and her last menstrual period was the end of November. She is currently in midcycle and she has had minor bleeding for the past 2-3 days. She states the volume is gradually decreasing. She had a tubal ligation in 2007. She denies pelvic pain or other discomfort.    History reviewed. No pertinent past medical history. Past Surgical History  Procedure Laterality Date  . Tubal ligation     History reviewed. No pertinent family history. History  Substance Use Topics  . Smoking status: Former Games developer  . Smokeless tobacco: Never Used  . Alcohol Use: Yes   OB History   Grav Para Term Preterm Abortions TAB SAB Ect Mult Living                 Review of Systems  Constitutional: Negative.   Respiratory: Negative.   Cardiovascular: Negative.   Gastrointestinal: Negative.   Genitourinary: Positive for genital sores. Negative for dysuria, urgency, flank pain, vaginal discharge and pelvic pain.  Musculoskeletal: Negative.     Allergies  Review of patient's allergies indicates no known allergies.  Home Medications   Current Outpatient Rx  Name  Route  Sig  Dispense  Refill  . acyclovir (ZOVIRAX) 400 MG tablet   Oral   Take 1 tablet (400 mg total) by mouth 3 (three) times daily.   30 tablet   0   . acyclovir (ZOVIRAX) 800 MG tablet   Oral   Take 1 tablet (800 mg total) by mouth 2 (two) times  daily. X 5 days   10 tablet   1   . cetirizine-pseudoephedrine (ZYRTEC-D) 5-120 MG per tablet   Oral   Take 1 tablet by mouth 2 (two) times daily as needed for allergies or rhinitis.   30 tablet   0   . fluconazole (DIFLUCAN) 150 MG tablet      1 tab by mouth q. 72 hours x4   4 tablet   0   . Homeopathic Products SOLN   Left Ear   Place 3-4 drops into the left ear 4 (four) times daily as needed. Ear pain. Homeopathic ear pain drops. .         . ibuprofen (ADVIL,MOTRIN) 600 MG tablet   Oral   Take 1 tablet (600 mg total) by mouth every 8 (eight) hours as needed for pain.   30 tablet   0   . metroNIDAZOLE (FLAGYL) 500 MG tablet   Oral   Take 1 tablet (500 mg total) by mouth 2 (two) times daily.   14 tablet   0   . predniSONE (DELTASONE) 20 MG tablet      2 tabs by mouth daily for 5 days   10 tablet   0   . traMADol (ULTRAM) 50 MG tablet   Oral   Take 1 tablet (50 mg total) by mouth every 6 (six) hours as needed for pain.   15 tablet  0    BP 146/89  Pulse 87  Temp(Src) 98.1 F (36.7 C) (Oral)  Resp 20  SpO2 100%  LMP 10/08/2013 Physical Exam  Nursing note and vitals reviewed. Constitutional: She is oriented to person, place, and time. She appears well-developed and well-nourished. No distress.  Eyes: Conjunctivae and EOM are normal.  Neck: Normal range of motion. Neck supple.  Cardiovascular: Normal rate.   Pulmonary/Chest: Effort normal. No respiratory distress.  Abdominal: Soft. She exhibits no distension and no mass. There is no tenderness. There is no rebound and no guarding.  No tenderness with palpation across the anterior pelvis.  Musculoskeletal: She exhibits no edema and no tenderness.  Neurological: She is alert and oriented to person, place, and time.  Skin: Skin is warm and dry.  Psychiatric: She has a normal mood and affect.    ED Course  Procedures (including critical care time) Labs Review Labs Reviewed - No data to  display Imaging Review No results found.    MDM   1. 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

## 2014-01-03 ENCOUNTER — Emergency Department (INDEPENDENT_AMBULATORY_CARE_PROVIDER_SITE_OTHER): Payer: Self-pay

## 2014-01-03 ENCOUNTER — Encounter (HOSPITAL_COMMUNITY): Payer: Self-pay | Admitting: Emergency Medicine

## 2014-01-03 ENCOUNTER — Emergency Department (INDEPENDENT_AMBULATORY_CARE_PROVIDER_SITE_OTHER)
Admission: EM | Admit: 2014-01-03 | Discharge: 2014-01-03 | Disposition: A | Discharge status: Home / Self Care | Payer: Self-pay | Source: Home / Self Care | Attending: Emergency Medicine | Admitting: Emergency Medicine

## 2014-01-03 DIAGNOSIS — A6000 Herpesviral infection of urogenital system, unspecified: Secondary | ICD-10-CM

## 2014-01-03 DIAGNOSIS — J111 Influenza due to unidentified influenza virus with other respiratory manifestations: Secondary | ICD-10-CM

## 2014-01-03 DIAGNOSIS — R69 Illness, unspecified: Principal | ICD-10-CM

## 2014-01-03 LAB — POCT RAPID STREP A: Streptococcus, Group A Screen (Direct): NEGATIVE

## 2014-01-03 MED ORDER — GUAIFENESIN-CODEINE 100-10 MG/5ML PO SYRP: 10.0000 mL | ORAL_SOLUTION | Freq: Four times a day (QID) | ORAL | Status: DC | PRN

## 2014-01-03 MED ORDER — OSELTAMIVIR PHOSPHATE 75 MG PO CAPS: 75.0000 mg | ORAL_CAPSULE | Freq: Two times a day (BID) | ORAL | Status: DC

## 2014-01-03 MED ORDER — IPRATROPIUM BROMIDE 0.06 % NA SOLN: 2.0000 | Freq: Four times a day (QID) | NASAL | Status: DC

## 2014-01-03 NOTE — ED Provider Notes (Signed)
Chief Complaint   Chief Complaint  Patient presents with  . URI    History of Present Illness   Kristina Delacruz is a 33 year old female who has had a two-day history of low-grade fever of up to 100.3, chills, sweats, dry cough, chest tightness, feels lightheaded, had a sore throat, nasal congestion with clear rhinorrhea, headache, and sinus pressure. She denies any GI symptoms. No suspicious sick exposures.  Review of Systems   Other than as noted above, the patient denies any of the following symptoms: Systemic:  No fevers, chills, sweats, or myalgias. Eye:  No redness or discharge. ENT:  No ear pain, headache, nasal congestion, drainage, sinus pressure, or sore throat. Neck:  No neck pain, stiffness, or swollen glands. Lungs:  No cough, sputum production, hemoptysis, wheezing, chest tightness, shortness of breath or chest pain. GI:  No abdominal pain, nausea, vomiting or diarrhea.  PMFSH   Past medical history, family history, social history, meds, and allergies were reviewed.   Physical exam   Vital signs:  BP 131/76  Pulse 108  Temp(Src) 98.2 F (36.8 C) (Oral)  Resp 20  SpO2 100%  LMP 12/31/2013 General:  Alert and oriented.  In no distress.  Skin warm and dry. Eye:  No conjunctival injection or drainage. Lids were normal. ENT:  TMs and canals were normal, without erythema or inflammation.  Nasal mucosa was clear and uncongested, without drainage.  Mucous membranes were moist.  Pharynx was clear with no exudate or drainage.  There were no oral ulcerations or lesions. Neck:  Supple, no adenopathy, tenderness or mass. Lungs:  No respiratory distress.  Lungs were clear to auscultation, without wheezes, rales or rhonchi.  Breath sounds were clear and equal bilaterally.  Heart:  Regular rhythm, without gallops, murmers or rubs. Skin:  Clear, warm, and dry, without rash or lesions.  Labs   Results for orders placed during the hospital encounter of 01/03/14  POCT RAPID  STREP A ([facility] ONLY)      Result Value Ref Range   Streptococcus, Group A Screen (Direct) NEGATIVE  NEGATIVE     Radiology   Dg Chest 2 View  01/03/2014   CLINICAL DATA:  Fever and cough for 2 days  EXAM: CHEST  2 VIEW  COMPARISON:  None.  FINDINGS: The heart size and mediastinal contours are within normal limits. Both lungs are clear. The visualized skeletal structures are unremarkable.  IMPRESSION: No active cardiopulmonary disease.   Electronically Signed   By: Salome HolmesHector  Cooper M.D.   On: 01/03/2014 09:43   Assessment     The encounter diagnosis was Influenza-like illness.  Plan    1.  Meds:  The following meds were prescribed:   Discharge Medication List as of 01/03/2014 10:01 AM    START taking these medications   Details  guaiFENesin-codeine (GUIATUSS AC) 100-10 MG/5ML syrup Take 10 mLs by mouth 4 (four) times daily as needed for cough., Starting 01/03/2014, Until Discontinued, Print    ipratropium (ATROVENT) 0.06 % nasal spray Place 2 sprays into both nostrils 4 (four) times daily., Starting 01/03/2014, Until Discontinued, Normal    oseltamivir (TAMIFLU) 75 MG capsule Take 1 capsule (75 mg total) by mouth every 12 (twelve) hours., Starting 01/03/2014, Until Discontinued, Normal        2.  Patient Education/Counseling:  The patient was given appropriate handouts, self care instructions, and instructed in symptomatic relief.  Instructed to get extra fluids, rest, and use a cool mist vaporizer.  3.  Follow up:  The patient was told to follow up here if no better in 3 to 4 days, or sooner if becoming worse in any way, and given some red flag symptoms such as increasing fever, difficulty breathing, chest pain, or persistent vomiting which would prompt immediate return.  Follow up here as needed.      Reuben Likes, MD 01/03/14 [phone]

## 2014-01-03 NOTE — Discharge Instructions (Signed)
Most upper respiratory infections are caused by viruses and do not require antibiotics.  We try to save the antibiotics for when we really need them to prevent bacteria from developing resistance to them.  Here are a few hints about things that can be done at home to help get over an upper respiratory infection quicker: ° °Get extra sleep and extra fluids.  Get 7 to 9 hours of sleep per night and 6 to 8 glasses of water a day.  Getting extra sleep keeps the immune system from getting run down.  Most people with an upper respiratory infection are a little dehydrated.  The extra fluids also keep the secretions liquified and easier to deal with.  Also, get extra vitamin C.  4000 mg per day is the recommended dose. °For the aches, headache, and fever, acetaminophen or ibuprofen are helpful.  These can be alternated every 4 hours.  People with liver disease should avoid large amounts of acetaminophen, and people with ulcer disease, gastroesophageal reflux, gastritis, congestive heart failure, chronic kidney disease, coronary artery disease and the elderly should avoid ibuprofen. °For nasal congestion try Mucinex-D, or if you're having lots of sneezing or clear nasal drainage use Zyrtec-D. People with high blood pressure can take these if their blood pressure is controlled, if not, it's best to avoid the forms with a "D" (decongestants).  You can use the plain Mucinex, Allegra, Claritin, or Zyrtec even if your blood pressure is not controlled.   °A Saline nasal spray such as Ocean Spray can also help.  You can add a decongestant sprays such as Afrin, but you should not use the decongestant sprays for more than 3 or 4 days since they can be habituating.  Breathe Rite nasal strips can also offer a non-drug alternative treatment to nasal congestion, especially at night. °For people with symptoms of sinusitis, sleeping with your head elevated can be helpful.  For sinus pain, moist, hot compresses to the face may provide some  relief.  Many people find that inhaling steam as in a shower or from a pot of steaming water can help. °For any viral infection, zinc containing lozenges such as Cold-Eze or Zicam are helpful.  Zinc helps to fight viral infection.  Hot salt water gargles (8 oz of hot water, 1/2 tsp of table salt, and a pinch of baking soda) can give relief as well as hot beverages such as hot tea.  Sucrets extra strength lozenges will help the sore throat.  °For the cough, take Delsym 2 tsp every 12 hours.  It has also been found recently that Aleve can help control a cough.  The dose is 1 to 2 tablets twice daily with food.  This can be combined with Delsym. (Note, if you are taking ibuprofen, you should not take Aleve as well--take one or the other.) °A cool mist vaporizer will help keep your mucous membranes from drying out.  ° °It's important when you have an upper respiratory infection not to pass the infection to others.  This involves being very careful about the following: ° °Frequent hand washing or use of hand sanitizer, especially after coughing, sneezing, blowing your nose or touching your face, nose or eyes. °Do not shake hands or touch anyone and try to avoid touching surfaces that other people use such as doorknobs, shopping carts, telephones and computer keyboards. °Use tissues and dispose of them properly in a garbage can or ziplock bag. °Cough into your sleeve. °Do not let others eat or   drink after you. ° °It's also important to recognize the signs of serious illness and get evaluated if they occur: °Any respiratory infection that lasts more than 7 to 10 days.  Yellow nasal drainage and sputum are not reliable indicators of a bacterial infection, but if they last for more than 1 week, see your doctor. °Fever and sore throat can indicate strep. °Fever and cough can indicate influenza or pneumonia. °Any kind of severe symptom such as difficulty breathing, intractable vomiting, or severe pain should prompt you to see  a doctor as soon as possible. ° ° °Your body's immune system is really the thing that will get rid of this infection.  Your immune system is comprised of 2 types of specialized cells called T cells and B cells.  T cells coordinate the array of cells in your body that engulf invading bacteria or viruses while B cells orchestrate the production of antibodies that neutralize infection.  Anything we do or any medications we give you, will just strengthen your immune system or help it clear up the infection quicker.  Here are a few helpful hints to improve your immune system to help overcome this illness or to prevent future infections: °· A few vitamins can improve the health of your immune system.  That's why your diet should include plenty of fruits, vegetables, fish, nuts, and whole grains. °· Vitamin A and bet-carotene can increase the cells that fight infections (T cells and B cells).  Vitamin A is abundant in dark greens and orange vegetables such as spinach, greens, sweet potatoes, and carrots. °· Vitamin B6 contributes to the maturation of white blood cells, the cells that fight disease.  Foods with vitamin B6 include cold cereal and bananas. °· Vitamin C is credited with preventing colds because it increases white blood cells and also prevents cellular damage.  Citrus fruits, peaches and green and red bell peppers are all hight in vitamin C. °· Vitamin E is an anti-oxidant that encourages the production of natural killer cells which reject foreign invaders and B cells that produce antibodies.  Foods high in vitamin E include wheat germ, nuts and seeds. °· Foods high in omega-3 fatty acids found in foods like salmon, tuna and mackerel boost your immune system and help cells to engulf and absorb germs. °· Probiotics are good bacteria that increase your T cells.  These can be found in yogurt and are available in supplements such as Culturelle or Align. °· Moderate exercise increases the strength of your immune  system and your ability to recover from illness.  I suggest 3 to 5 moderate intensity 30 minute workouts per week.   °· Sleep is another component of maintaining a strong immune system.  It enables your body to recuperate from the day's activities, stress and work.  My recommendation is to get between 7 and 9 hours of sleep per night. °· If you smoke, try to quit completely or at least cut down.  Drink alcohol only in moderation if at all.  No more than 2 drinks daily for men or 1 for women. °· Get a flu vaccine early in the fall or if you have not gotten one yet, once this illness has run its course.  If you are over 65, a smoker, or an asthmatic, get a pneumococcal vaccine. °· My final recommendation is to maintain a healthy weight.  Excess weight can impair the immune system by interfering with the way the immune system deals with invading viruses or   bacteria. ° ° ° °Influenza, Adult °Influenza ("the flu") is a viral infection of the respiratory tract. It occurs more often in winter months because people spend more time in close contact with one another. Influenza can make you feel very sick. Influenza easily spreads from person to person (contagious). °CAUSES  °Influenza is caused by a virus that infects the respiratory tract. You can catch the virus by breathing in droplets from an infected person's cough or sneeze. You can also catch the virus by touching something that was recently contaminated with the virus and then touching your mouth, nose, or eyes. °SYMPTOMS  °Symptoms typically last 4 to 10 days and may include: °· Fever. °· Chills. °· Headache, body aches, and muscle aches. °· Sore throat. °· Chest discomfort and cough. °· Poor appetite. °· Weakness or feeling tired. °· Dizziness. °· Nausea or vomiting. °DIAGNOSIS  °Diagnosis of influenza is often made based on your history and a physical exam. A nose or throat swab test can be done to confirm the diagnosis. °RISKS AND COMPLICATIONS °You may be at risk  for a more severe case of influenza if you smoke cigarettes, have diabetes, have chronic heart disease (such as heart failure) or lung disease (such as asthma), or if you have a weakened immune system. Elderly people and pregnant women are also at risk for more serious infections. The most common complication of influenza is a lung infection (pneumonia). Sometimes, this complication can require emergency medical care and may be life-threatening. °PREVENTION  °An annual influenza vaccination (flu shot) is the best way to avoid getting influenza. An annual flu shot is now routinely recommended for all adults in the [place] °TREATMENT  °In mild cases, influenza goes away on its own. Treatment is directed at relieving symptoms. For more severe cases, your caregiver may prescribe antiviral medicines to shorten the sickness. Antibiotic medicines are not effective, because the infection is caused by a virus, not by bacteria. °HOME CARE INSTRUCTIONS °· Only take over-the-counter or prescription medicines for pain, discomfort, or fever as directed by your caregiver. °· Use a cool mist humidifier to make breathing easier. °· Get plenty of rest until your temperature returns to normal. This usually takes 3 to 4 days. °· Drink enough fluids to keep your urine clear or pale yellow. °· Cover your mouth and nose when coughing or sneezing, and wash your hands well to avoid spreading the virus. °· Stay home from work or school until your fever has been gone for at least 1 full day. °SEEK MEDICAL CARE IF:  °· You have chest pain or a deep cough that worsens or produces more mucus. °· You have nausea, vomiting, or diarrhea. °SEEK IMMEDIATE MEDICAL CARE IF:  °· You have difficulty breathing, shortness of breath, or your skin or nails turn bluish. °· You have severe neck pain or stiffness. °· You have a severe headache, facial pain, or earache. °· You have a worsening or recurring fever. °· You have nausea or vomiting that cannot be  controlled. °MAKE SURE YOU: °· Understand these instructions. °· Will watch your condition. °· Will get help right away if you are not doing well or get worse. °Document Released: 10/25/2000 Document Revised: 04/28/2012 Document Reviewed: 01/27/2012 °[employer]® Patient Information ©2014 [employer]. ° °

## 2014-01-03 NOTE — ED Notes (Signed)
C/o  Fever.  Productive cough with green/yellow sputum..  Sore throat.  No relief with otc sinus meds.  On set yesterday.   Denies n/v/d

## 2014-01-05 LAB — CULTURE, GROUP A STREP

## 2015-01-08 ENCOUNTER — Emergency Department (HOSPITAL_BASED_OUTPATIENT_CLINIC_OR_DEPARTMENT_OTHER): Payer: No Typology Code available for payment source

## 2015-01-08 ENCOUNTER — Emergency Department (HOSPITAL_BASED_OUTPATIENT_CLINIC_OR_DEPARTMENT_OTHER)
Admission: EM | Admit: 2015-01-08 | Discharge: 2015-01-08 | Disposition: A | Discharge status: Home / Self Care | Payer: Self-pay | Attending: Emergency Medicine | Admitting: Emergency Medicine

## 2015-01-08 ENCOUNTER — Encounter (HOSPITAL_BASED_OUTPATIENT_CLINIC_OR_DEPARTMENT_OTHER): Payer: Self-pay | Admitting: *Deleted

## 2015-01-08 ENCOUNTER — Emergency Department (HOSPITAL_BASED_OUTPATIENT_CLINIC_OR_DEPARTMENT_OTHER): Payer: Self-pay

## 2015-01-08 DIAGNOSIS — Z7952 Long term (current) use of systemic steroids: Secondary | ICD-10-CM | POA: Insufficient documentation

## 2015-01-08 DIAGNOSIS — Y998 Other external cause status: Secondary | ICD-10-CM | POA: Insufficient documentation

## 2015-01-08 DIAGNOSIS — Y9389 Activity, other specified: Secondary | ICD-10-CM | POA: Insufficient documentation

## 2015-01-08 DIAGNOSIS — Z23 Encounter for immunization: Secondary | ICD-10-CM | POA: Insufficient documentation

## 2015-01-08 DIAGNOSIS — Z79899 Other long term (current) drug therapy: Secondary | ICD-10-CM | POA: Insufficient documentation

## 2015-01-08 DIAGNOSIS — Z3202 Encounter for pregnancy test, result negative: Secondary | ICD-10-CM | POA: Insufficient documentation

## 2015-01-08 DIAGNOSIS — Y92410 Unspecified street and highway as the place of occurrence of the external cause: Secondary | ICD-10-CM | POA: Insufficient documentation

## 2015-01-08 DIAGNOSIS — Z87891 Personal history of nicotine dependence: Secondary | ICD-10-CM | POA: Insufficient documentation

## 2015-01-08 DIAGNOSIS — Z792 Long term (current) use of antibiotics: Secondary | ICD-10-CM | POA: Insufficient documentation

## 2015-01-08 DIAGNOSIS — Z791 Long term (current) use of non-steroidal anti-inflammatories (NSAID): Secondary | ICD-10-CM | POA: Insufficient documentation

## 2015-01-08 DIAGNOSIS — S3991XA Unspecified injury of abdomen, initial encounter: Secondary | ICD-10-CM | POA: Insufficient documentation

## 2015-01-08 DIAGNOSIS — A599 Trichomoniasis, unspecified: Secondary | ICD-10-CM | POA: Insufficient documentation

## 2015-01-08 LAB — CBC WITH DIFFERENTIAL/PLATELET
Basophils Absolute: 0 10*3/uL (ref 0.0–0.1)
Basophils Relative: 0 % (ref 0–1)
EOS PCT: 0 % (ref 0–5)
Eosinophils Absolute: 0 10*3/uL (ref 0.0–0.7)
HCT: 38.8 % (ref 36.0–46.0)
Hemoglobin: 13.5 g/dL (ref 12.0–15.0)
LYMPHS ABS: 2 10*3/uL (ref 0.7–4.0)
LYMPHS PCT: 19 % (ref 12–46)
MCH: 31.3 pg (ref 26.0–34.0)
MCHC: 34.8 g/dL (ref 30.0–36.0)
MCV: 89.8 fL (ref 78.0–100.0)
Monocytes Absolute: 0.7 10*3/uL (ref 0.1–1.0)
Monocytes Relative: 6 % (ref 3–12)
NEUTROS ABS: 7.5 10*3/uL (ref 1.7–7.7)
Neutrophils Relative %: 75 % (ref 43–77)
Platelets: 237 10*3/uL (ref 150–400)
RBC: 4.32 MIL/uL (ref 3.87–5.11)
RDW: 12.1 % (ref 11.5–15.5)
WBC: 10.2 10*3/uL (ref 4.0–10.5)

## 2015-01-08 LAB — URINALYSIS, ROUTINE W REFLEX MICROSCOPIC
Bilirubin Urine: NEGATIVE
GLUCOSE, UA: NEGATIVE mg/dL
HGB URINE DIPSTICK: NEGATIVE
Ketones, ur: NEGATIVE mg/dL
Nitrite: NEGATIVE
PH: 5.5 (ref 5.0–8.0)
PROTEIN: NEGATIVE mg/dL
SPECIFIC GRAVITY, URINE: 1.017 (ref 1.005–1.030)
UROBILINOGEN UA: 0.2 mg/dL (ref 0.0–1.0)

## 2015-01-08 LAB — BASIC METABOLIC PANEL
Anion gap: 4 — ABNORMAL LOW (ref 5–15)
BUN: 14 mg/dL (ref 6–23)
CALCIUM: 8.9 mg/dL (ref 8.4–10.5)
CO2: 20 mmol/L (ref 19–32)
CREATININE: 0.65 mg/dL (ref 0.50–1.10)
Chloride: 110 mmol/L (ref 96–112)
GLUCOSE: 100 mg/dL — AB (ref 70–99)
Potassium: 3.2 mmol/L — ABNORMAL LOW (ref 3.5–5.1)
Sodium: 134 mmol/L — ABNORMAL LOW (ref 135–145)

## 2015-01-08 LAB — URINE MICROSCOPIC-ADD ON

## 2015-01-08 LAB — PREGNANCY, URINE: Preg Test, Ur: NEGATIVE

## 2015-01-08 MED ORDER — METHOCARBAMOL 500 MG PO TABS
500.0000 mg | ORAL_TABLET | Freq: Once | ORAL | Status: AC
  Administered 2015-01-08: 500 mg via ORAL
  Filled 2015-01-08: qty 1

## 2015-01-08 MED ORDER — OXYCODONE-ACETAMINOPHEN 5-325 MG PO TABS
1.0000 | ORAL_TABLET | Freq: Once | ORAL | Status: AC
  Administered 2015-01-08: 1 via ORAL
  Filled 2015-01-08: qty 1

## 2015-01-08 MED ORDER — METHOCARBAMOL 500 MG PO TABS: 500.0000 mg | ORAL_TABLET | Freq: Two times a day (BID) | ORAL | Status: DC

## 2015-01-08 MED ORDER — TETANUS-DIPHTH-ACELL PERTUSSIS 5-2.5-18.5 LF-MCG/0.5 IM SUSP
0.5000 mL | Freq: Once | INTRAMUSCULAR | Status: AC
  Administered 2015-01-08: 0.5 mL via INTRAMUSCULAR
  Filled 2015-01-08: qty 0.5

## 2015-01-08 MED ORDER — IOHEXOL 300 MG/ML  SOLN
100.0000 mL | Freq: Once | INTRAMUSCULAR | Status: AC | PRN
  Administered 2015-01-08: 100 mL via INTRAVENOUS

## 2015-01-08 MED ORDER — OXYCODONE-ACETAMINOPHEN 5-325 MG PO TABS: 1.0000 | ORAL_TABLET | Freq: Four times a day (QID) | ORAL | Status: DC | PRN

## 2015-01-08 MED ORDER — METRONIDAZOLE 500 MG PO TABS
2000.0000 mg | ORAL_TABLET | Freq: Once | ORAL | Status: AC
  Administered 2015-01-08: 2000 mg via ORAL
  Filled 2015-01-08: qty 4
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
## 2016-07-30 ENCOUNTER — Encounter (INDEPENDENT_AMBULATORY_CARE_PROVIDER_SITE_OTHER): Payer: Self-pay

## 2016-10-07 ENCOUNTER — Encounter (HOSPITAL_COMMUNITY): Payer: Self-pay | Admitting: *Deleted

## 2016-10-07 ENCOUNTER — Ambulatory Visit (HOSPITAL_COMMUNITY)
Admission: EM | Admit: 2016-10-07 | Discharge: 2016-10-07 | Disposition: A | Discharge status: Home / Self Care | Payer: Self-pay | Attending: Emergency Medicine | Admitting: Emergency Medicine

## 2016-10-07 DIAGNOSIS — B9689 Other specified bacterial agents as the cause of diseases classified elsewhere: Secondary | ICD-10-CM

## 2016-10-07 DIAGNOSIS — J019 Acute sinusitis, unspecified: Secondary | ICD-10-CM

## 2016-10-07 MED ORDER — FLUTICASONE PROPIONATE 50 MCG/ACT NA SUSP: 2.0000 | Freq: Every day | NASAL | 0 refills | Status: DC

## 2016-10-07 MED ORDER — AMOXICILLIN-POT CLAVULANATE 875-125 MG PO TABS: 1.0000 | ORAL_TABLET | Freq: Two times a day (BID) | ORAL | 0 refills | Status: DC

## 2016-10-07 NOTE — ED Triage Notes (Signed)
SYMPTOMS    X  SEV  WEEKS       PT      REPORTS  SYMPTOMS   NOT  RELEIVED  BY  OTC  MEDS            CONGESTED       THE  COUGH  IS  NON  PRODUCTIVE  FOR THE  MOST  PART

## 2016-10-07 NOTE — ED Notes (Signed)
Staff assisted patient to locate cheapest place to purchase medications.  Printed info from goodrx site.  Staff called [facility] to get pricing.  Gave patient goodrx card.

## 2016-10-07 NOTE — Discharge Instructions (Signed)
Given the duration of your symptoms, I will treat you for an acute bacterial sinus infection. Start taking Augmentin twice daily for the next 7 days.  I have also prescribed Flonase that will help with nasal congestion.  Follow up if your symptoms are not improving or worsen.

## 2016-10-07 NOTE — ED Provider Notes (Signed)
CSN: [id number]     Arrival date & time 10/07/16  1137 History   First MD Initiated Contact with Patient 10/07/16 1311     Chief Complaint  Patient presents with  . URI   (Consider location/radiation/quality/duration/timing/severity/associated sxs/prior Treatment) HPI   The patient noted she had rhinorrhea for 15 days. This was accompanied by headaches located in the frontal region and a cough that is non-productive.  She also endorses nasal congestion.  No vision change with headaches.  No facial pain.  She denies fevers, chills, otalgias, SOB, chest pain, eye pain, eye drainage, sore throat.   She has a poor appetite due to lack of taste. No N/V or diarrhea.  She's tried Sudafed, Mucinex, Delsym, Tylenol Cold and Flu without relief.  No sick contacts. Denies underlying lung disease.   History reviewed. No pertinent past medical history. Past Surgical History:  Procedure Laterality Date  . TUBAL LIGATION     History reviewed. No pertinent family history. Social History  Substance Use Topics  . Smoking status: Former Games developermoker  . Smokeless tobacco: Never Used  . Alcohol use Yes   OB History    No data available     Review of Systems  Constitutional: Positive for appetite change. Negative for activity change, chills, diaphoresis, fatigue and fever.  HENT: Positive for congestion, nosebleeds, sinus pain and sinus pressure. Negative for drooling, ear discharge, ear pain, postnasal drip, rhinorrhea, sneezing, sore throat and trouble swallowing.   Eyes: Negative for photophobia, pain, redness, itching and visual disturbance.  Respiratory: Positive for cough. Negative for choking, chest tightness, shortness of breath, wheezing and stridor.   Cardiovascular: Negative for chest pain and palpitations.  Gastrointestinal: Negative for abdominal pain, diarrhea, nausea and vomiting.  Endocrine: Negative.   Genitourinary: Negative.   Musculoskeletal: Negative for myalgias.   Allergic/Immunologic: Negative.   Neurological: Positive for headaches. Negative for dizziness and light-headedness.  Hematological: Negative.   Psychiatric/Behavioral: Negative.     Allergies  Patient has no known allergies.  Home Medications   Prior to Admission medications   Medication Sig Start Date End Date Taking? Authorizing Provider  acyclovir (ZOVIRAX) 400 MG tablet Take 1 tablet (400 mg total) by mouth 3 (three) times daily. 05/20/13   Adlih Moreno-Coll, MD  acyclovir (ZOVIRAX) 800 MG tablet Take 1 tablet (800 mg total) by mouth 2 (two) times daily. X 5 days 10/21/13   Hayden Rasmussenavid Mabe, NP  amoxicillin-clavulanate (AUGMENTIN) 875-125 MG tablet Take 1 tablet by mouth 2 (two) times daily. 10/07/16   Joanna Puffrystal S Shaindel Sweeten, MD  cetirizine-pseudoephedrine (ZYRTEC-D) 5-120 MG per tablet Take 1 tablet by mouth 2 (two) times daily as needed for allergies or rhinitis. 05/14/13   Adlih Moreno-Coll, MD  fluconazole (DIFLUCAN) 150 MG tablet 1 tab by mouth q. 72 hours x4 05/14/13   Adlih Moreno-Coll, MD  fluticasone (FLONASE) 50 MCG/ACT nasal spray Place 2 sprays into both nostrils daily. 10/07/16   Joanna Puffrystal S Jarrad Mclees, MD  guaiFENesin-codeine (GUIATUSS AC) 100-10 MG/5ML syrup Take 10 mLs by mouth 4 (four) times daily as needed for cough. 01/03/14   Reuben Likesavid C Keller, MD  Homeopathic Products SOLN Place 3-4 drops into the left ear 4 (four) times daily as needed. Ear pain. Homeopathic ear pain drops. .    Historical Provider, MD  ibuprofen (ADVIL,MOTRIN) 600 MG tablet Take 1 tablet (600 mg total) by mouth every 8 (eight) hours as needed for pain. 05/14/13   Adlih Moreno-Coll, MD  ipratropium (ATROVENT) 0.06 % nasal spray Place 2 sprays  into both nostrils 4 (four) times daily. 01/03/14   Reuben Likesavid C Keller, MD  methocarbamol (ROBAXIN) 500 MG tablet Take 1 tablet (500 mg total) by mouth 2 (two) times daily. 01/08/15   April Palumbo, MD  metroNIDAZOLE (FLAGYL) 500 MG tablet Take 1 tablet (500 mg total) by mouth 2 (two) times  daily. 05/20/13   Adlih Moreno-Coll, MD  oseltamivir (TAMIFLU) 75 MG capsule Take 1 capsule (75 mg total) by mouth every 12 (twelve) hours. 01/03/14   Reuben Likesavid C Keller, MD  oxyCODONE-acetaminophen (PERCOCET) 5-325 MG per tablet Take 1 tablet by mouth every 6 (six) hours as needed. 01/08/15   April Palumbo, MD  predniSONE (DELTASONE) 20 MG tablet 2 tabs by mouth daily for 5 days 05/14/13   Sharin GraveAdlih Moreno-Coll, MD  traMADol (ULTRAM) 50 MG tablet Take 1 tablet (50 mg total) by mouth every 6 (six) hours as needed for pain. 05/14/13   Sharin GraveAdlih Moreno-Coll, MD   Meds Ordered and Administered this Visit  Medications - No data to display  BP 129/87 (BP Location: Right Arm)   Pulse 79   Temp 97.6 F (36.4 C) (Oral)   Resp 18   LMP 10/07/2016   SpO2 100%  No data found.   Physical Exam  Constitutional: She is oriented to person, place, and time. She appears well-developed and well-nourished. No distress.  HENT:  Head: Normocephalic and atraumatic.  Right Ear: External ear normal.  Left Ear: External ear normal.  Mouth/Throat: Oropharynx is clear and moist.  Nasal turbinates boggy. Frontal tenderness to palpation. Frontal pain worsened with looking down  Eyes: Conjunctivae and EOM are normal. Pupils are equal, round, and reactive to light. Right eye exhibits no discharge. Left eye exhibits no discharge. No scleral icterus.  Neck: Normal range of motion. Neck supple.  Cardiovascular: Normal rate, regular rhythm and normal heart sounds.   No murmur heard. Pulmonary/Chest: Effort normal and breath sounds normal. No respiratory distress. She has no wheezes. She has no rales. She exhibits no tenderness.  Abdominal: Soft. There is no tenderness.  Lymphadenopathy:    She has no cervical adenopathy.  Neurological: She is alert and oriented to person, place, and time.  Skin: Capillary refill takes less than 2 seconds. She is not diaphoretic.  Psychiatric: She has a normal mood and affect.    Urgent Care  Course   Clinical Course     Procedures (including critical care time)  Labs Review Labs Reviewed - No data to display  Imaging Review No results found.    MDM   1. Acute bacterial sinusitis    This is a 35 y/o previously healthy female presenting with 15 days of rhinorrhea, nasal congestion, facial pain, and a non-productive cough. Most bothersome symptoms are the frontal headaches and congestion. Patient non-toxic on exam. Given the duration of symptoms and the fact that they're worsening instead of improving, will treat as a bacterial sinusitis. Rx for Augmentin BID written (no allergies per patient) and Flonase to help with nasal congestion. Discussed return precautions.     Joanna Puffrystal S Cleve Paolillo, MD 10/07/16 1351

## 2021-08-08 ENCOUNTER — Other Ambulatory Visit (HOSPITAL_COMMUNITY)
Admission: RE | Admit: 2021-08-08 | Discharge: 2021-08-08 | Disposition: A | Discharge status: Home / Self Care | Payer: Self-pay | Source: Ambulatory Visit | Attending: Adult Health | Admitting: Adult Health

## 2021-08-08 ENCOUNTER — Other Ambulatory Visit: Payer: Self-pay

## 2021-08-08 ENCOUNTER — Ambulatory Visit: Payer: Self-pay | Admitting: Adult Health

## 2021-08-08 ENCOUNTER — Encounter: Payer: Self-pay | Admitting: Adult Health

## 2021-08-08 VITALS — BP 130/85 | HR 82 | Ht 61.25 in | Wt 171.0 lb

## 2021-08-08 DIAGNOSIS — N926 Irregular menstruation, unspecified: Secondary | ICD-10-CM | POA: Insufficient documentation

## 2021-08-08 DIAGNOSIS — R232 Flushing: Secondary | ICD-10-CM

## 2021-08-08 DIAGNOSIS — Z124 Encounter for screening for malignant neoplasm of cervix: Secondary | ICD-10-CM

## 2021-08-08 DIAGNOSIS — R61 Generalized hyperhidrosis: Secondary | ICD-10-CM

## 2021-08-08 MED ORDER — LO LOESTRIN FE 1 MG-10 MCG / 10 MCG PO TABS: 1.0000 | ORAL_TABLET | Freq: Every day | ORAL | 0 refills | Status: DC

## 2021-08-08 NOTE — Progress Notes (Signed)
Patient ID: EMIKO OSORTO, female   DOB: 05/06/1981, 40 y.o.   MRN: [id number] History of Present Illness: Dawnette is a 40 year old white female,single,G1P1, referred by [facility] for irregular bleeding and a pap, she is self pay. She had Essure about 14 years ago. She is having periods 2 x a month for about 4 months now and they last about 2-3 days and has cramps into back and left side swells with period, and she has nausea. She has not had sex in 8 years. She is tired but labs normal at health dept. She is also having hot flashes and night sweats, and waking up at night. Her mom went through menopause starting at 77. She also can't lose weight.  She has never had mammogram.   Current Medications, Allergies, Past Medical History, Past Surgical History, Family History and Social History were reviewed in [employer] record.     Review of Systems: Patient denies any headaches, hearing loss, fatigue, blurred vision, shortness of breath, chest pain,  problems with bowel movements, urination, or intercourse.(Not active). No joint pain or mood swings.   See HPI for positives.   Physical Exam:BP 130/85 (BP Location: Left Arm, Patient Position: Sitting, Cuff Size: Normal)   Pulse 82   Ht 5' 1.25" (1.556 m)   Wt 171 lb (77.6 kg)   LMP 07/16/2021   BMI 32.05 kg/m   General:  Well developed, well nourished, no acute distress Skin:  Warm and dry Lungs; Clear to auscultation bilaterally Cardiovascular: Regular rate and rhythm Abdomen is soft and non tender, no HSM Pelvic:  External genitalia is normal in appearance, no lesions.  The vagina is normal in appearance. Urethra has no lesions or masses. The cervix is smooth, pap with HR HPV genotyping performed.  Uterus is felt to be normal size, shape, and contour.  No adnexal masses or tenderness noted.Bladder is non tender, no masses felt. Rectal:Deferred Extremities/musculoskeletal:  No swelling or varicosities noted, no clubbing  or cyanosis Psych:  No mood changes, alert and cooperative,seems happy AA is 2 Fall risk is low Depression screen [facility] 2/9 08/08/2021  Decreased Interest 0  Down, Depressed, Hopeless 0  PHQ - 2 Score 0  Altered sleeping 1  Tired, decreased energy 1  Change in appetite 0  Feeling bad or failure about yourself  0  Trouble concentrating 0  Moving slowly or fidgety/restless 0  Suicidal thoughts 0  PHQ-9 Score 2    GAD 7 : Generalized Anxiety Score 08/08/2021  Nervous, Anxious, on Edge 0  Control/stop worrying 0  Worry too much - different things 0  Trouble relaxing 0  Restless 0  Easily annoyed or irritable 0  Afraid - awful might happen 0  Total GAD 7 Score 0      Upstream - 08/08/21 1439       Pregnancy Intention Screening   Does the patient want to become pregnant in the next year? No    Does the patient's partner want to become pregnant in the next year? N/A    Would the patient like to discuss contraceptive options today? No      Contraception Wrap Up   Current Method Abstinence;Female Sterilization    End Method Abstinence;Female Sterilization    Contraception Counseling Provided No            Examination chaperoned by [employer], Charity fundraiser.   Impression and Plan: 1. Irregular periods She denies, MI,stroke, DVT, or breast cancer, or migraine with aura Will  try lo loestrin, 2 packs given, can start now, to see if helps with period and hot flashes and night sweats Meds ordered this encounter  Medications   Norethindrone-Ethinyl Estradiol-Fe Biphas (LO LOESTRIN FE) 1 MG-10 MCG / 10 MCG tablet    Sig: Take 1 tablet by mouth daily. Take 1 daily by mouth    Dispense:  48 tablet    Refill:  0    BIN [id number], PCN [id number], GRP [id number]    Order Specific Question:   Supervising Provider    Answer:   Duane Lope H [[id number]]   Follow up in 6 weeks   2. Routine Papanicolaou smear Pap sent  3. Hot flashes   4. Night sweats   Discussed get CFA papers done Will send  to [facility] for mammogram but she wants to ask her mom where she went for free mammogram first

## 2021-08-13 ENCOUNTER — Encounter: Payer: Self-pay | Admitting: Adult Health

## 2021-08-13 ENCOUNTER — Other Ambulatory Visit: Payer: Self-pay | Admitting: Adult Health

## 2021-08-13 DIAGNOSIS — A599 Trichomoniasis, unspecified: Secondary | ICD-10-CM

## 2021-08-13 HISTORY — DX: Trichomoniasis, unspecified: A59.9

## 2021-08-13 LAB — CYTOLOGY - PAP
Comment: NEGATIVE
Diagnosis: NEGATIVE
High risk HPV: NEGATIVE

## 2021-08-13 MED ORDER — METRONIDAZOLE 500 MG PO TABS: 500.0000 mg | ORAL_TABLET | Freq: Two times a day (BID) | ORAL | 0 refills | Status: DC

## 2021-08-13 NOTE — Progress Notes (Signed)
+  trich on pap will rx flagyl, no sex or alcohol during treatment and and tell partner to get treated

## 2021-09-18 ENCOUNTER — Other Ambulatory Visit: Payer: Self-pay

## 2021-09-18 ENCOUNTER — Encounter: Payer: Self-pay | Admitting: Adult Health

## 2021-09-18 ENCOUNTER — Ambulatory Visit: Payer: Self-pay | Admitting: Adult Health

## 2021-09-18 VITALS — BP 149/98 | HR 87 | Ht 61.0 in | Wt 170.0 lb

## 2021-09-18 DIAGNOSIS — R4589 Other symptoms and signs involving emotional state: Secondary | ICD-10-CM

## 2021-09-18 DIAGNOSIS — N946 Dysmenorrhea, unspecified: Secondary | ICD-10-CM

## 2021-09-18 DIAGNOSIS — R232 Flushing: Secondary | ICD-10-CM

## 2021-09-18 DIAGNOSIS — N926 Irregular menstruation, unspecified: Secondary | ICD-10-CM

## 2021-09-18 MED ORDER — NEXTSTELLIS 3-14.2 MG PO TABS: 1.0000 | ORAL_TABLET | Freq: Every day | ORAL | 0 refills | Status: DC

## 2021-09-18 NOTE — Progress Notes (Signed)
  Subjective:     Patient ID: PATIENCE NUZZO, female   DOB: [birth year], 40 y.o.   MRN: [id number]  HPI Garnett is a 40 year old white female,single, G1P1 in follow up on trying lo loestrin for period management and hot flashes.  She is self pay.   Review of Systems Last period painful and was brown and had some clots, very moody Hot flashes some    Reviewed past medical,surgical, social and family history. Reviewed medications and allergies.  Objective:   Physical Exam BP (!) 149/98 (BP Location: Left Arm, Patient Position: Sitting, Cuff Size: Normal)   Pulse 87   Ht 5\' 1"  (1.549 m)   Wt 170 lb (77.1 kg)   LMP 08/20/2021 (Approximate)   BMI 32.12 kg/m     Skin warm and dry.  Lungs: clear to ausculation bilaterally. Cardiovascular: regular rate and rhythm.  Fall risk is low  Upstream - 09/18/21 1419       Pregnancy Intention Screening   Does the patient want to become pregnant in the next year? No    Does the patient's partner want to become pregnant in the next year? No    Would the patient like to discuss contraceptive options today? No      Contraception Wrap Up   Current Method Female Sterilization    End Method Female Sterilization    Contraception Counseling Provided No             Assessment:     1. Dysmenorrhea Finish current pack of lo loestrin and start nextstellis, 3 packs given  Meds ordered this encounter  Medications   Drospirenone-Estetrol (NEXTSTELLIS) 3-14.2 MG TABS    Sig: Take 1 tablet by mouth daily.    Dispense:  84 tablet    Refill:  0    Order Specific Question:   Supervising Provider    Answer:   13/08/22, LUTHER H [[id number]]     2. Irregular periods   3. Hot flashes   4. Moody     Plan:     Follow up in 10 weeks  Get CFA

## 2021-11-27 ENCOUNTER — Other Ambulatory Visit: Payer: Self-pay

## 2021-11-27 ENCOUNTER — Ambulatory Visit (INDEPENDENT_AMBULATORY_CARE_PROVIDER_SITE_OTHER): Payer: Self-pay | Admitting: Adult Health

## 2021-11-27 ENCOUNTER — Encounter: Payer: Self-pay | Admitting: Adult Health

## 2021-11-27 VITALS — BP 136/102 | HR 100 | Ht 62.0 in | Wt 162.5 lb

## 2021-11-27 DIAGNOSIS — R232 Flushing: Secondary | ICD-10-CM

## 2021-11-27 DIAGNOSIS — R4589 Other symptoms and signs involving emotional state: Secondary | ICD-10-CM

## 2021-11-27 DIAGNOSIS — N946 Dysmenorrhea, unspecified: Secondary | ICD-10-CM

## 2021-11-27 DIAGNOSIS — N926 Irregular menstruation, unspecified: Secondary | ICD-10-CM

## 2021-11-27 MED ORDER — SLYND 4 MG PO TABS: 1.0000 | ORAL_TABLET | Freq: Every day | ORAL | 0 refills | Status: DC

## 2021-11-27 NOTE — Progress Notes (Signed)
°  Subjective:     Patient ID: Kristina Delacruz, female   DOB: 1981/01/22, 41 y.o.   MRN: [id number]  HPI Aloma is a 41 year old white female,single, G1P1 back in follow up on trying nextstellis and still with hot flashes and moody, periods brown now. She finished last nextstellis pill today.   Lab Results  Component Value Date   DIAGPAP  08/08/2021    - Negative for intraepithelial lesion or malignancy (NILM)   HPVHIGH Negative 08/08/2021    Review of Systems Periods brown now,still has cramps  +hot flashes +moody, esp with daughter Reviewed past medical,surgical, social and family history. Reviewed medications and allergies.     Objective:   Physical Exam BP (!) 136/102 (BP Location: Left Arm, Patient Position: Sitting, Cuff Size: Normal)    Pulse 100    Ht 5\' 2"  (1.575 m)    Wt 162 lb 8 oz (73.7 kg)    LMP 11/26/2021    BMI 29.72 kg/m     Skin warm and dry. Lungs: clear to ausculation bilaterally. Cardiovascular: regular rate and rhythm.  Fall risk is low Depression screen [facility] 2/9 11/27/2021 08/08/2021  Decreased Interest 1 0  Down, Depressed, Hopeless 0 0  PHQ - 2 Score 1 0  Altered sleeping 1 1  Tired, decreased energy 1 1  Change in appetite 1 0  Feeling bad or failure about yourself  0 0  Trouble concentrating 0 0  Moving slowly or fidgety/restless 0 0  Suicidal thoughts 0 0  PHQ-9 Score 4 2    GAD 7 : Generalized Anxiety Score 11/27/2021 08/08/2021  Nervous, Anxious, on Edge 0 0  Control/stop worrying 0 0  Worry too much - different things 0 0  Trouble relaxing 0 0  Restless 0 0  Easily annoyed or irritable 1 0  Afraid - awful might happen 0 0  Total GAD 7 Score 1 0    Upstream - 11/27/21 1418       Pregnancy Intention Screening   Does the patient want to become pregnant in the next year? No    Does the patient's partner want to become pregnant in the next year? No    Would the patient like to discuss contraceptive options today? No      Contraception Wrap Up    Current Method Female Sterilization    End Method Female Sterilization    Contraception Counseling Provided No               Assessment:     1. Hot flashes I gave her 3 packs slynd to start in am Drink 16 oz soy milk per day  She declines SSRI like paxil for now.  2. Moody  3. Irregular periods  4. Dysmenorrhea     Plan:     Follow up in 10 weeks

## 2022-02-05 ENCOUNTER — Other Ambulatory Visit: Payer: Self-pay

## 2022-02-05 ENCOUNTER — Ambulatory Visit (INDEPENDENT_AMBULATORY_CARE_PROVIDER_SITE_OTHER): Payer: 59 | Admitting: Adult Health

## 2022-02-05 ENCOUNTER — Encounter: Payer: Self-pay | Admitting: Adult Health

## 2022-02-05 VITALS — BP 140/98 | HR 88 | Ht 61.0 in | Wt 170.0 lb

## 2022-02-05 DIAGNOSIS — R1032 Left lower quadrant pain: Secondary | ICD-10-CM

## 2022-02-05 DIAGNOSIS — N946 Dysmenorrhea, unspecified: Secondary | ICD-10-CM | POA: Diagnosis not present

## 2022-02-05 DIAGNOSIS — R4589 Other symptoms and signs involving emotional state: Secondary | ICD-10-CM | POA: Diagnosis not present

## 2022-02-05 DIAGNOSIS — R232 Flushing: Secondary | ICD-10-CM | POA: Diagnosis not present

## 2022-02-05 DIAGNOSIS — G479 Sleep disorder, unspecified: Secondary | ICD-10-CM

## 2022-02-05 DIAGNOSIS — N926 Irregular menstruation, unspecified: Secondary | ICD-10-CM | POA: Diagnosis not present

## 2022-02-05 DIAGNOSIS — R69 Illness, unspecified: Secondary | ICD-10-CM | POA: Diagnosis not present

## 2022-02-05 MED ORDER — TRAZODONE HCL 50 MG PO TABS: 50.0000 mg | ORAL_TABLET | Freq: Every day | ORAL | 1 refills | Status: DC

## 2022-02-05 NOTE — Progress Notes (Signed)
?  Subjective:  ?  ? Patient ID: Kristina Delacruz, female   DOB: 12/21/1980, 41 y.o.   MRN: [id number] ? ?HPI ?Kristina Delacruz is a 41 year old white female, G1P1 back in follow up on starting slynd for complaints of irregular bleeding,hot flashes and cramps.Bleeding stopped, and hot flashes better. Still moody and not sleeping well.  Had LLQ pain for 2 weeks, she had ESSURE years ago. ?Lab Results  ?Component Value Date  ? DIAGPAP  08/08/2021  ?  - Negative for intraepithelial lesion or malignancy (NILM)  ? HPVHIGH Negative 08/08/2021  ?  ?Review of Systems ?Bleeding stopped  ?Hot flashes better ?Still moody ?Not sleeping but about 4 hours  ?Had LLQ pain for 2 weeks starting week 01/14/22 ?Reviewed past medical,surgical, social and family history. Reviewed medications and allergies.  ?   ?Objective:  ? Physical Exam ?BP (!) 140/98 (BP Location: Left Arm, Patient Position: Sitting, Cuff Size: Normal)   Pulse 88   Ht 5\' 1"  (1.549 m)   Wt 170 lb (77.1 kg)   BMI 32.12 kg/m?   ?  Skin warm and dry.  Lungs: clear to ausculation bilaterally. Cardiovascular: regular rate and rhythm.  ?Fall risk is low ? Upstream - 02/05/22 1415   ? ?  ? Pregnancy Intention Screening  ? Does the patient want to become pregnant in the next year? No   ? Does the patient's partner want to become pregnant in the next year? No   ? Would the patient like to discuss contraceptive options today? No   ?  ? Contraception Wrap Up  ? Current Method Oral Contraceptive;Female Sterilization   ? End Method Oral Contraceptive;Female Sterilization   ? Contraception Counseling Provided No   ? ?  ?  ? ?  ?  ?Assessment:  ?   ?1. Hot flashes ?Are better with slynd  ? ?2. Moody ?Will try trazodone for sleep, and it may helps moods too ? ?3. LLQ pain ?Will get 02/07/22 02/07/22 at [facility] at 1:15 pm to assess uterus and ovaries ?- [facility] PELVIC COMPLETE WITH TRANSVAGINAL; Future ? ?4. Sleep disturbance ?Will try trazodone ?Meds ordered this encounter  ?Medications  ? traZODone  (DESYREL) 50 MG tablet  ?  Sig: Take 1 tablet (50 mg total) by mouth at bedtime.  ?  Dispense:  30 tablet  ?  Refill:  1  ?  Order Specific Question:   Supervising Provider  ?  Answer:   [place] H [[id number]]  ?  ? ?5. Irregular periods,periods have stopped with slynd ?Continue slynd ?- Duane Lope PELVIC COMPLETE WITH TRANSVAGINAL; Future ? ?6. Dysmenorrhea ?Period stopped with slynd ?- US PELVIC COMPLETE WITH TRANSVAGINAL; Future  ?   ?Plan:  ?   ?Follow up in 1 week to discuss further options  ?   ?

## 2022-02-07 ENCOUNTER — Ambulatory Visit (HOSPITAL_COMMUNITY)
Admission: RE | Admit: 2022-02-07 | Discharge: 2022-02-07 | Disposition: A | Discharge status: Home / Self Care | Payer: 59 | Source: Ambulatory Visit | Attending: Adult Health | Admitting: Adult Health

## 2022-02-07 DIAGNOSIS — R1032 Left lower quadrant pain: Secondary | ICD-10-CM | POA: Insufficient documentation

## 2022-02-07 DIAGNOSIS — N926 Irregular menstruation, unspecified: Secondary | ICD-10-CM | POA: Diagnosis not present

## 2022-02-07 DIAGNOSIS — N946 Dysmenorrhea, unspecified: Secondary | ICD-10-CM | POA: Insufficient documentation

## 2022-02-12 ENCOUNTER — Ambulatory Visit (INDEPENDENT_AMBULATORY_CARE_PROVIDER_SITE_OTHER): Payer: 59 | Admitting: Adult Health

## 2022-02-12 ENCOUNTER — Encounter: Payer: Self-pay | Admitting: Adult Health

## 2022-02-12 VITALS — BP 144/91 | HR 77 | Ht 61.0 in | Wt 169.0 lb

## 2022-02-12 DIAGNOSIS — N946 Dysmenorrhea, unspecified: Secondary | ICD-10-CM

## 2022-02-12 DIAGNOSIS — R1032 Left lower quadrant pain: Secondary | ICD-10-CM

## 2022-02-12 DIAGNOSIS — R4589 Other symptoms and signs involving emotional state: Secondary | ICD-10-CM

## 2022-02-12 DIAGNOSIS — R232 Flushing: Secondary | ICD-10-CM | POA: Diagnosis not present

## 2022-02-12 DIAGNOSIS — R69 Illness, unspecified: Secondary | ICD-10-CM | POA: Diagnosis not present

## 2022-02-12 DIAGNOSIS — G479 Sleep disorder, unspecified: Secondary | ICD-10-CM

## 2022-02-12 NOTE — Progress Notes (Addendum)
?  Subjective:  ?  ? Patient ID: Kristina Delacruz, female   DOB: [birth year], 41 y.o.   MRN: [id number] ? ?HPI ?Kristina Delacruz is a 41 year old white female, G1P1 in to review US performed 02/07/22. And it was normal.  ?Still has LLQ pain. ? ?Review of Systems ?+LLQ pain ?Hot flashes better ?Moods better ?Sleeping better  ?No bleeding, some brown discahrge ?Reviewed past medical,surgical, social and family history. Reviewed medications and allergies.  ?   ?Objective:  ? Physical Exam ?BP (!) 144/91 (BP Location: Left Arm, Patient Position: Sitting, Cuff Size: Normal)   Pulse 77   Ht 5\' 1"  (1.549 m)   Wt 169 lb (76.7 kg)   LMP 02/12/2022   BMI 31.93 kg/m?   ?  Skin warm and dry.  Lungs: clear to ausculation bilaterally. Cardiovascular: regular rate and rhythm.  ?Fall risk is low. ? ? Upstream - 02/12/22 1501   ? ?  ? Pregnancy Intention Screening  ? Does the patient want to become pregnant in the next year? No   ? Does the patient's partner want to become pregnant in the next year? No   ? Would the patient like to discuss contraceptive options today? No   ?  ? Contraception Wrap Up  ? Current Method Female Sterilization   ? End Method Female Sterilization   ? ?  ?  ? ?  ?  ?Assessment:  ?   ?1. LLQ pain ?Will get appt with Dr 04/14/22 to have ESSURE removed and remove tubes ? ?2. Dysmenorrhea ?Continue slynd gave 1 pack today ?Consider endometrial ablation,handout given  ? ?3. Sleep disturbance ?Better with trazodone 50 mg at HS, has refills  ? ?4. Hot flashes ?Better with slynd ? ?5. Moody ?Better with slynd ?   ?Plan:  ?   ?Return in 3 weeks to talk with Dr Kristina Delacruz  ?   ?

## 2022-03-05 ENCOUNTER — Encounter: Payer: Self-pay | Admitting: Obstetrics & Gynecology

## 2022-03-05 ENCOUNTER — Ambulatory Visit (INDEPENDENT_AMBULATORY_CARE_PROVIDER_SITE_OTHER): Payer: 59 | Admitting: Obstetrics & Gynecology

## 2022-03-05 VITALS — BP 137/92 | HR 83 | Ht 61.0 in | Wt 171.0 lb

## 2022-03-05 DIAGNOSIS — N921 Excessive and frequent menstruation with irregular cycle: Secondary | ICD-10-CM

## 2022-03-05 DIAGNOSIS — N946 Dysmenorrhea, unspecified: Secondary | ICD-10-CM

## 2022-03-05 DIAGNOSIS — R1032 Left lower quadrant pain: Secondary | ICD-10-CM | POA: Diagnosis not present

## 2022-03-05 NOTE — Progress Notes (Signed)
Follow up appointment for results: sonogram  Chief Complaint  Patient presents with   Pre-op Exam    Discuss removal of essure and removing tubes, also wants to discuss ablation    Blood pressure (!) 137/92, pulse 83, height 5\' 1"  (1.549 m), weight 171 lb (77.6 kg), last menstrual period 02/12/2022.  CLINICAL DATA:  LEFT lower quadrant pain and irregular menses, G1P1, LMP 01/17/2022   EXAM: TRANSABDOMINAL AND TRANSVAGINAL ULTRASOUND OF PELVIS   TECHNIQUE: Both transabdominal and transvaginal ultrasound examinations of the pelvis were performed. Transabdominal technique was performed for global imaging of the pelvis including uterus, ovaries, adnexal regions, and pelvic cul-de-sac. It was necessary to proceed with endovaginal exam following the transabdominal exam to visualize the endometrium and adnexa.   COMPARISON:  None   FINDINGS: Uterus   Measurements: 7.7 x 3.8 x 4.6 cm = volume: 70 mL. Anteverted. Normal morphology without mass. Essure coils noted.   Endometrium   Thickness: 5 mm.  No endometrial fluid or mass   Right ovary   Measurements: 2.7 x 1.7 x 2.0 cm = volume: 4.7 mL. Normal morphology without mass   Left ovary   Measurements: 8.5 x 2.2 x 3.7 cm = volume: 14.9 mL. Dominant physiologic follicle 2.4 cm diameter; no follow-up imaging recommended.   Other findings   No free pelvic fluid.  No adnexal masses.   IMPRESSION: Normal exam.     Electronically Signed   By: 03/19/2022 M.D.   On: 02/07/2022 14:03    MEDS ordered this encounter: No orders of the defined types were placed in this encounter.   Orders for this encounter: No orders of the defined types were placed in this encounter.   Impression + Management Plan   ICD-10-CM   1. Dysmenorrhea  N94.6     2. Menometrorrhagia  N92.1     3. LLQ pain  R10.32       Follow Up: Will schedule essure removal and laparoscopic bilateral salpingectomy at patient request     All  questions were answered.  Past Medical History:  Diagnosis Date   GERD (gastroesophageal reflux disease)    Trichimoniasis 08/13/2021   08/13/21 treated with flagyl.    Past Surgical History:  Procedure Laterality Date   ESSURE TUBAL LIGATION Bilateral    TUBAL LIGATION      OB History     Gravida  1   Para  1   Term  1   Preterm      AB      Living  1      SAB      IAB      Ectopic      Multiple      Live Births  1           Allergies  Allergen Reactions   Bee Venom    Onion Rash    Social History   Socioeconomic History   Marital status: Single    Spouse name: Not on file   Number of children: Not on file   Years of education: Not on file   Highest education level: Not on file  Occupational History   Not on file  Tobacco Use   Smoking status: Former    Types: Cigarettes   Smokeless tobacco: Never  Vaping Use   Vaping Use: Never used  Substance and Sexual Activity   Alcohol use: Not Currently   Drug use: No   Sexual activity: Yes    Birth  control/protection: Surgical    Comment: tubal ESSURE  Other Topics Concern   Not on file  Social History Narrative   Not on file   Social Determinants of Health   Financial Resource Strain: Low Risk    Difficulty of Paying Living Expenses: Not very hard  Food Insecurity: No Food Insecurity   Worried About Programme researcher, broadcasting/film/video in the Last Year: Never true   Ran Out of Food in the Last Year: Never true  Transportation Needs: No Transportation Needs   Lack of Transportation (Medical): No   Lack of Transportation (Non-Medical): No  Physical Activity: Sufficiently Active   Days of Exercise per Week: 6 days   Minutes of Exercise per Session: 30 min  Stress: No Stress Concern Present   Feeling of Stress : Only a little  Social Connections: Moderately Isolated   Frequency of Communication with Friends and Family: More than three times a week   Frequency of Social Gatherings with Friends and  Family: Three times a week   Attends Religious Services: More than 4 times per year   Active Member of Clubs or Organizations: No   Attends Banker Meetings: Never   Marital Status: Divorced    Family History  Problem Relation Age of Onset   Hypertension Maternal Grandmother    Hypertension Father    Thyroid disease Mother    Hypercholesterolemia Sister    Diabetes Daughter    Thyroid disease Paternal Aunt    Thyroid disease Paternal Uncle

## 2022-03-19 ENCOUNTER — Encounter: Payer: Self-pay | Admitting: Obstetrics & Gynecology

## 2022-04-03 ENCOUNTER — Other Ambulatory Visit: Payer: Self-pay | Admitting: Adult Health

## 2022-04-11 ENCOUNTER — Encounter: Payer: Self-pay | Admitting: Obstetrics & Gynecology

## 2022-04-15 ENCOUNTER — Encounter: Payer: Self-pay | Admitting: Obstetrics & Gynecology

## 2022-04-15 ENCOUNTER — Other Ambulatory Visit (HOSPITAL_COMMUNITY)
Admission: RE | Admit: 2022-04-15 | Discharge: 2022-04-15 | Disposition: A | Discharge status: Home / Self Care | Payer: 59 | Source: Ambulatory Visit | Attending: Obstetrics & Gynecology | Admitting: Obstetrics & Gynecology

## 2022-04-15 ENCOUNTER — Ambulatory Visit (INDEPENDENT_AMBULATORY_CARE_PROVIDER_SITE_OTHER): Payer: 59 | Admitting: Obstetrics & Gynecology

## 2022-04-15 VITALS — BP 149/92 | HR 84 | Ht 61.0 in | Wt 166.0 lb

## 2022-04-15 DIAGNOSIS — N946 Dysmenorrhea, unspecified: Secondary | ICD-10-CM | POA: Diagnosis not present

## 2022-04-15 DIAGNOSIS — Z3202 Encounter for pregnancy test, result negative: Secondary | ICD-10-CM

## 2022-04-15 DIAGNOSIS — N921 Excessive and frequent menstruation with irregular cycle: Secondary | ICD-10-CM | POA: Diagnosis not present

## 2022-04-15 LAB — POCT URINE PREGNANCY: Preg Test, Ur: NEGATIVE

## 2022-04-15 NOTE — Progress Notes (Signed)
Endometrial Biopsy Procedure Note: Pre op requested by insurance company  Pre-operative Diagnosis: Menometrorrhagia/dysmenorrhea  Post-operative Diagnosis: same  Indications: pre operative evlauation  Procedure Details   Urine pregnancy test was not done.  The risks (including infection, bleeding, pain, and uterine perforation) and benefits of the procedure were explained to the patient and Written informed consent was obtained.  Antibiotic prophylaxis against endocarditis was not indicated.   The patient was placed in the dorsal lithotomy position.  Bimanual exam showed the uterus to be in the neutral position.  A Graves' speculum inserted in the vagina, and the cervix prepped with povidone iodine.  Endocervical curettage with a Kevorkian curette was not performed.   A sharp tenaculum was applied to the anterior lip of the cervix for stabilization.  A sterile uterine sound was used to sound the uterus to a depth of 6.5 cm.  A Pipelle endometrial aspirator was used to sample the endometrium.  Sample was sent for pathologic examination.  Condition: Stable  Complications: None  Plan:  The patient was advised to call for any fever or for prolonged or severe pain or bleeding. She was advised to use OTC analgesics as needed for mild to moderate pain. She was advised to avoid vaginal intercourse for 48 hours or until the bleeding has completely stopped.  Attending Physician Documentation: I was present for or performed the following: endometrial biopsy

## 2022-04-15 NOTE — Addendum Note (Signed)
Addended by: Federico Flake A on: 04/15/2022 12:19 PM   Modules accepted: Orders

## 2022-04-16 LAB — SURGICAL PATHOLOGY

## 2022-04-18 NOTE — Patient Instructions (Signed)
Kristina Delacruz  04/18/2022     @   Your procedure is scheduled on  04/24/2022.   Report to [facility] at  0915  A.M.   Call this number if you have problems the morning of surgery:  [phone]   Remember:  Do not eat after midnight.   You may drink clear liquids until 0645 am on 04/24/2022.   Clear liquids allowed are:                    Water, Juice (non-citric and without pulp - diabetics please choose diet or no sugar options), Carbonated beverages - (diabetics please choose diet or no sugar options), Clear Tea, Black Coffee only (no creamer, milk or cream including half and half), Plain Jell-O only (diabetics please choose diet or no sugar options), Gatorade (diabetics please choose diet or no sugar options), and Plain Popsicles only    At 0645 am on 04/24/2022 drink your carb drink. You can have nothing else to drink after this.     Take these medicines the morning of surgery with A SIP OF WATER                   nexium, allegra, prilosec.     Do not wear jewelry, make-up or nail polish.  Do not wear lotions, powders, or perfumes, or deodorant.  Do not shave 48 hours prior to surgery.  Men may shave face and neck.  Do not bring valuables to the hospital.  [facility] is not responsible for any belongings or valuables.  Contacts, dentures or bridgework may not be worn into surgery.  Leave your suitcase in the car.  After surgery it may be brought to your room.  For patients admitted to the hospital, discharge time will be determined by your treatment team.  Patients discharged the day of surgery will not be allowed to drive home and must have someone with them for 24 hours.    Special instructions:   DO NOT smoke tobacco or vape for 24 hours before your procedure.  Please read over the following fact sheets that you were given. Coughing and Deep Breathing, Surgical Site Infection Prevention, Anesthesia Post-op Instructions, and  Care and Recovery After Surgery      Salpingectomy, Care After The following information offers guidance on how to care for yourself after your procedure. Your health care provider may also give you more specific instructions. If you have problems or questions, contact your health care provider. What can I expect after the procedure? After the procedure, it is common to have: Pain in your abdomen. Light vaginal bleeding (spotting) for a few days. Tiredness. Your recovery time will depend on which method was used for your surgery. Follow these instructions at home: Medicines Take over-the-counter and prescription medicines only as told by your health care provider. Ask your health care provider if the medicine prescribed to you: Requires you to avoid driving or using machinery. Can cause constipation. You may need to take actions to prevent or treat constipation, such as: Drink enough fluid to keep your urine pale yellow. Take over-the-counter or prescription medicines. Eat foods that are high in fiber, such as beans, whole grains, and fresh fruits and vegetables. Limit foods that are high in fat and processed sugars, such as fried or sweet foods. Incision care  Follow instructions from your health care provider about how to take care of your  incision or incisions. Make sure you: Wash your hands with soap and water for at least 20 seconds before and after you change your bandage (dressing). If soap and water are not available, use hand sanitizer. Change or remove your dressing as told by your health care provider. Leave stitches (sutures), skin glue, staples, or adhesive strips in place. These skin closures may need to stay in place for 2 weeks or longer. If adhesive strip edges start to loosen and curl up, you may trim the loose edges. Do not remove adhesive strips completely unless your health care provider tells you to do that. Keep your dressing clean and dry. Check your incision area  every day for signs of infection. Check for: Redness, swelling, or pain that gets worse. Fluid or blood. Warmth. Pus or a bad smell. Activity Rest as told by your health care provider. Avoid sitting for a long time without moving. Get up to take short walks every 1-2 hours. This is important to improve blood flow and breathing. Ask for help if you feel weak or unsteady. Return to your normal activities as told by your health care provider. Ask your health care provider what activities are safe for you. Do not drive until your health care provider says that it is safe. Do not lift anything that is heavier than 10 lb (4.5 kg), or the limit that you are told, until your health care provider says that it is safe. This may last for 2-6 weeks depending on your surgery. Do not douche, use tampons, or have sex until your health care provider approves. General instructions Do not use any products that contain nicotine or tobacco. These products include cigarettes, chewing tobacco, and vaping devices, such as e-cigarettes. These can delay healing after surgery. If you need help quitting, ask your health care provider. Wear compression stockings as told by your health care provider. These stockings help to prevent blood clots and reduce swelling in your legs. Do not take baths, swim, or use a hot tub until your health care provider approves. You may take showers. Keep all follow-up visits. This is important. Contact a health care provider if: You have pain when you urinate. You have redness, swelling, or more pain around an incision or an incision feels warm to the touch. You have pus, fluid, blood, or a bad smell coming from an incision or an incision starts to open. You have a fever. You have abdominal pain that gets worse or does not get better with medicine. You have a rash. You feel light-headed, have nausea and vomiting, or both. Get help right away if: You have pain in your chest or leg. You  develop shortness of breath. You faint. You have increased or heavy vaginal bleeding, such as soaking a sanitary napkin in an hour. These symptoms may represent a serious problem that is an emergency. Do not wait to see if the symptoms will go away. Get medical help right away. Call your local emergency services (911 in the [place] Do not drive yourself to the hospital. Summary After the procedure, it is common to feel tired, have pain in your abdomen, and have light vaginal bleeding for a few days. Follow instructions from your health care provider about how to take care of your incision or incisions. Return to your normal activities as told by your health care provider. Ask your health care provider what activities are safe for you. Do not douche, use tampons, or have sex until your health care provider  approves. Keep all follow-up visits. This is important. This information is not intended to replace advice given to you by your health care provider. Make sure you discuss any questions you have with your health care provider. Document Revised: 09/19/2020 Document Reviewed: 09/19/2020 Elsevier Patient Education  2023 [employer] Dilation and Curettage or Vacuum Curettage, Care After The following information offers guidance on how to care for yourself after your procedure. Your doctor may also give you more specific instructions. If you have problems or questions, contact your doctor. What can I expect after the procedure? After the procedure, it is common to have: Mild pain or cramps. Some bleeding or spotting from the vagina. These may last for up to 2 weeks. Follow these instructions at home: Medicines Take over-the-counter and prescription medicines only as told by your doctor. If told, take steps to prevent problems with pooping (constipation). You may need to: Drink enough fluid to keep your pee (urine) pale yellow. Take medicines. You will be told what medicines to take. Eat foods  that are high in fiber. These include beans, whole grains, and fresh fruits and vegetables. Limit foods that are high in fat and sugar. These include fried or sweet foods. Ask your doctor if you should avoid driving or using machines while you are taking your medicine. Activity  If you were given a medicine to help you relax (sedative) during your procedure, it can affect you for many hours. Do not drive or use machinery until your doctor says that it is safe. Rest as told by your doctor. Get up to take short walks every 1-2 hours. Ask for help if you feel weak or unsteady. Do not lift anything that is heavier than 10 lb (4.5 kg), or the limit that you are told. Return to your normal activities when your doctor says that it is safe. Lifestyle For at least 2 weeks, or as long as told by your doctor: Do not douche. Do not use tampons. Do not have sex. General instructions Do not take baths, swim, or use a hot tub. Ask your doctor if you may take showers. Do not smoke or use any products that contain nicotine or tobacco. These can delay healing. If you need help quitting, ask your doctor. Wear compression stockings as told by your doctor. It is up to you to get the results of your procedure. Ask how to get your results when they are ready. Keep all follow-up visits. Contact a doctor if: You have very bad cramps that get worse or do not get better with medicine. You have very bad pain in your belly (abdomen). You cannot drink fluids without vomiting. You have pain in the area just above your thighs. You have fluid from your vagina that smells bad. You have a rash. Get help right away if: You are bleeding a lot from your vagina. This means soaking more than one sanitary pad in 1 hour, and this happens for 2 hours in a row. You have a fever that is above 100.38F (38C). Your belly feels very tender or hard. You have chest pain. You have trouble breathing. You feel dizzy or  light-headed. You faint. You have pain in your neck or shoulder area. These symptoms may be an emergency. Get help right away. Call your local emergency services (911 in the [place] Do not wait to see if the symptoms will go away. Do not drive yourself to the hospital. Summary After your procedure, it is common to have pain or  cramping. It is also common to have bleeding or spotting from your vagina. Rest as told. Get up to take short walks every 1-2 hours. Do not lift anything that is heavier than 10 lb (4.5 kg), or the limit that you are told. Get help right away if you have problems from the procedure. Ask your doctor what problems to watch for. This information is not intended to replace advice given to you by your health care provider. Make sure you discuss any questions you have with your health care provider. Document Revised: 10/18/2020 Document Reviewed: 10/18/2020 Elsevier Patient Education  2023 [employer] General Anesthesia, Adult, Care After This sheet gives you information about how to care for yourself after your procedure. Your health care provider may also give you more specific instructions. If you have problems or questions, contact your health care provider. What can I expect after the procedure? After the procedure, the following side effects are common: Pain or discomfort at the IV site. Nausea. Vomiting. Sore throat. Trouble concentrating. Feeling cold or chills. Feeling weak or tired. Sleepiness and fatigue. Soreness and body aches. These side effects can affect parts of the body that were not involved in surgery. Follow these instructions at home: For the time period you were told by your health care provider:  Rest. Do not participate in activities where you could fall or become injured. Do not drive or use machinery. Do not drink alcohol. Do not take sleeping pills or medicines that cause drowsiness. Do not make important decisions or sign legal  documents. Do not take care of children on your own. Eating and drinking Follow any instructions from your health care provider about eating or drinking restrictions. When you feel hungry, start by eating small amounts of foods that are soft and easy to digest (bland), such as toast. Gradually return to your regular diet. Drink enough fluid to keep your urine pale yellow. If you vomit, rehydrate by drinking water, juice, or clear broth. General instructions If you have sleep apnea, surgery and certain medicines can increase your risk for breathing problems. Follow instructions from your health care provider about wearing your sleep device: Anytime you are sleeping, including during daytime naps. While taking prescription pain medicines, sleeping medicines, or medicines that make you drowsy. Have a responsible adult stay with you for the time you are told. It is important to have someone help care for you until you are awake and alert. Return to your normal activities as told by your health care provider. Ask your health care provider what activities are safe for you. Take over-the-counter and prescription medicines only as told by your health care provider. If you smoke, do not smoke without supervision. Keep all follow-up visits as told by your health care provider. This is important. Contact a health care provider if: You have nausea or vomiting that does not get better with medicine. You cannot eat or drink without vomiting. You have pain that does not get better with medicine. You are unable to pass urine. You develop a skin rash. You have a fever. You have redness around your IV site that gets worse. Get help right away if: You have difficulty breathing. You have chest pain. You have blood in your urine or stool, or you vomit blood. Summary After the procedure, it is common to have a sore throat or nausea. It is also common to feel tired. Have a responsible adult stay with you for  the time you are told. It is important  to have someone help care for you until you are awake and alert. When you feel hungry, start by eating small amounts of foods that are soft and easy to digest (bland), such as toast. Gradually return to your regular diet. Drink enough fluid to keep your urine pale yellow. Return to your normal activities as told by your health care provider. Ask your health care provider what activities are safe for you. This information is not intended to replace advice given to you by your health care provider. Make sure you discuss any questions you have with your health care provider. Document Revised: 07/13/2020 Document Reviewed: 02/10/2020 Elsevier Patient Education  2023 [employer] How to Use Chlorhexidine for Bathing Chlorhexidine gluconate (CHG) is a germ-killing (antiseptic) solution that is used to clean the skin. It can get rid of the bacteria that normally live on the skin and can keep them away for about 24 hours. To clean your skin with CHG, you may be given: A CHG solution to use in the shower or as part of a sponge bath. A prepackaged cloth that contains CHG. Cleaning your skin with CHG may help lower the risk for infection: While you are staying in the intensive care unit of the hospital. If you have a vascular access, such as a central line, to provide short-term or long-term access to your veins. If you have a catheter to drain urine from your bladder. If you are on a ventilator. A ventilator is a machine that helps you breathe by moving air in and out of your lungs. After surgery. What are the risks? Risks of using CHG include: A skin reaction. Hearing loss, if CHG gets in your ears and you have a perforated eardrum. Eye injury, if CHG gets in your eyes and is not rinsed out. The CHG product catching fire. Make sure that you avoid smoking and flames after applying CHG to your skin. Do not use CHG: If you have a chlorhexidine allergy or have  previously reacted to chlorhexidine. On babies younger than 29 months of age. How to use CHG solution Use CHG only as told by your health care provider, and follow the instructions on the label. Use the full amount of CHG as directed. Usually, this is one bottle. During a shower Follow these steps when using CHG solution during a shower (unless your health care provider gives you different instructions): Start the shower. Use your normal soap and shampoo to wash your face and hair. Turn off the shower or move out of the shower stream. Pour the CHG onto a clean washcloth. Do not use any type of brush or rough-edged sponge. Starting at your neck, lather your body down to your toes. Make sure you follow these instructions: If you will be having surgery, pay special attention to the part of your body where you will be having surgery. Scrub this area for at least 1 minute. Do not use CHG on your head or face. If the solution gets into your ears or eyes, rinse them well with water. Avoid your genital area. Avoid any areas of skin that have broken skin, cuts, or scrapes. Scrub your back and under your arms. Make sure to wash skin folds. Let the lather sit on your skin for 1-2 minutes or as long as told by your health care provider. Thoroughly rinse your entire body in the shower. Make sure that all body creases and crevices are rinsed well. Dry off with a clean towel. Do not put any  substances on your body afterward--such as powder, lotion, or perfume--unless you are told to do so by your health care provider. Only use lotions that are recommended by the manufacturer. Put on clean clothes or pajamas. If it is the night before your surgery, sleep in clean sheets.  During a sponge bath Follow these steps when using CHG solution during a sponge bath (unless your health care provider gives you different instructions): Use your normal soap and shampoo to wash your face and hair. Pour the CHG onto a  clean washcloth. Starting at your neck, lather your body down to your toes. Make sure you follow these instructions: If you will be having surgery, pay special attention to the part of your body where you will be having surgery. Scrub this area for at least 1 minute. Do not use CHG on your head or face. If the solution gets into your ears or eyes, rinse them well with water. Avoid your genital area. Avoid any areas of skin that have broken skin, cuts, or scrapes. Scrub your back and under your arms. Make sure to wash skin folds. Let the lather sit on your skin for 1-2 minutes or as long as told by your health care provider. Using a different clean, wet washcloth, thoroughly rinse your entire body. Make sure that all body creases and crevices are rinsed well. Dry off with a clean towel. Do not put any substances on your body afterward--such as powder, lotion, or perfume--unless you are told to do so by your health care provider. Only use lotions that are recommended by the manufacturer. Put on clean clothes or pajamas. If it is the night before your surgery, sleep in clean sheets. How to use CHG prepackaged cloths Only use CHG cloths as told by your health care provider, and follow the instructions on the label. Use the CHG cloth on clean, dry skin. Do not use the CHG cloth on your head or face unless your health care provider tells you to. When washing with the CHG cloth: Avoid your genital area. Avoid any areas of skin that have broken skin, cuts, or scrapes. Before surgery Follow these steps when using a CHG cloth to clean before surgery (unless your health care provider gives you different instructions): Using the CHG cloth, vigorously scrub the part of your body where you will be having surgery. Scrub using a back-and-forth motion for 3 minutes. The area on your body should be completely wet with CHG when you are done scrubbing. Do not rinse. Discard the cloth and let the area air-dry. Do  not put any substances on the area afterward, such as powder, lotion, or perfume. Put on clean clothes or pajamas. If it is the night before your surgery, sleep in clean sheets.  For general bathing Follow these steps when using CHG cloths for general bathing (unless your health care provider gives you different instructions). Use a separate CHG cloth for each area of your body. Make sure you wash between any folds of skin and between your fingers and toes. Wash your body in the following order, switching to a new cloth after each step: The front of your neck, shoulders, and chest. Both of your arms, under your arms, and your hands. Your stomach and groin area, avoiding the genitals. Your right leg and foot. Your left leg and foot. The back of your neck, your back, and your buttocks. Do not rinse. Discard the cloth and let the area air-dry. Do not put any substances on  your body afterward--such as powder, lotion, or perfume--unless you are told to do so by your health care provider. Only use lotions that are recommended by the manufacturer. Put on clean clothes or pajamas. Contact a health care provider if: Your skin gets irritated after scrubbing. You have questions about using your solution or cloth. You swallow any chlorhexidine. Call your local poison control center [phone]) 391-5704 in the [place] Get help right away if: Your eyes itch badly, or they become very red or swollen. Your skin itches badly and is red or swollen. Your hearing changes. You have trouble seeing. You have swelling or tingling in your mouth or throat. You have trouble breathing. These symptoms may represent a serious problem that is an emergency. Do not wait to see if the symptoms will go away. Get medical help right away. Call your local emergency services (911 in the [place] Do not drive yourself to the hospital. Summary Chlorhexidine gluconate (CHG) is a germ-killing (antiseptic) solution that is used to clean  the skin. Cleaning your skin with CHG may help to lower your risk for infection. You may be given CHG to use for bathing. It may be in a bottle or in a prepackaged cloth to use on your skin. Carefully follow your health care provider's instructions and the instructions on the product label. Do not use CHG if you have a chlorhexidine allergy. Contact your health care provider if your skin gets irritated after scrubbing. This information is not intended to replace advice given to you by your health care provider. Make sure you discuss any questions you have with your health care provider. Document Revised: 01/08/2021 Document Reviewed: 01/08/2021 Elsevier Patient Education  2023 [employer].

## 2022-04-22 ENCOUNTER — Encounter (HOSPITAL_COMMUNITY)
Admission: RE | Admit: 2022-04-22 | Discharge: 2022-04-22 | Disposition: A | Discharge status: Home / Self Care | Payer: 59 | Source: Ambulatory Visit | Attending: Obstetrics & Gynecology | Admitting: Obstetrics & Gynecology

## 2022-04-22 ENCOUNTER — Other Ambulatory Visit: Payer: Self-pay | Admitting: Obstetrics & Gynecology

## 2022-04-22 VITALS — BP 131/88 | HR 86 | Temp 97.8°F | Resp 18 | Ht 61.0 in | Wt 166.0 lb

## 2022-04-22 DIAGNOSIS — Z8249 Family history of ischemic heart disease and other diseases of the circulatory system: Secondary | ICD-10-CM

## 2022-04-22 DIAGNOSIS — Z01818 Encounter for other preprocedural examination: Secondary | ICD-10-CM | POA: Diagnosis not present

## 2022-04-22 LAB — URINALYSIS, ROUTINE W REFLEX MICROSCOPIC
Bilirubin Urine: NEGATIVE
Glucose, UA: NEGATIVE mg/dL
Ketones, ur: NEGATIVE mg/dL
Leukocytes,Ua: NEGATIVE
Nitrite: NEGATIVE
Protein, ur: NEGATIVE mg/dL
Specific Gravity, Urine: 1.017 (ref 1.005–1.030)
pH: 6 (ref 5.0–8.0)

## 2022-04-22 LAB — RAPID HIV SCREEN (HIV 1/2 AB+AG)
HIV 1/2 Antibodies: NONREACTIVE
HIV-1 P24 Antigen - HIV24: NONREACTIVE

## 2022-04-22 LAB — COMPREHENSIVE METABOLIC PANEL
ALT: 13 U/L (ref 0–44)
AST: 12 U/L — ABNORMAL LOW (ref 15–41)
Albumin: 3.9 g/dL (ref 3.5–5.0)
Alkaline Phosphatase: 50 U/L (ref 38–126)
Anion gap: 6 (ref 5–15)
BUN: 9 mg/dL (ref 6–20)
CO2: 21 mmol/L — ABNORMAL LOW (ref 22–32)
Calcium: 9 mg/dL (ref 8.9–10.3)
Chloride: 110 mmol/L (ref 98–111)
Creatinine, Ser: 0.61 mg/dL (ref 0.44–1.00)
GFR, Estimated: >60 mL/min (ref >60)
Glucose, Bld: 75 mg/dL (ref 70–99)
Potassium: 3.1 mmol/L — ABNORMAL LOW (ref 3.5–5.1)
Sodium: 137 mmol/L (ref 135–145)
Total Bilirubin: 0.4 mg/dL (ref 0.3–1.2)
Total Protein: 7.3 g/dL (ref 6.5–8.1)

## 2022-04-22 LAB — CBC
HCT: 38 % (ref 36.0–46.0)
Hemoglobin: 12.9 g/dL (ref 12.0–15.0)
MCH: 28.5 pg (ref 26.0–34.0)
MCHC: 33.9 g/dL (ref 30.0–36.0)
MCV: 83.9 fL (ref 80.0–100.0)
Platelets: 295 10*3/uL (ref 150–400)
RBC: 4.53 MIL/uL (ref 3.87–5.11)
RDW: 13.5 % (ref 11.5–15.5)
WBC: 5.7 10*3/uL (ref 4.0–10.5)
nRBC: 0 % (ref 0.0–0.2)

## 2022-04-22 LAB — PREGNANCY, URINE: Preg Test, Ur: NEGATIVE

## 2022-04-24 ENCOUNTER — Encounter (HOSPITAL_COMMUNITY)
Admission: RE | Disposition: A | Discharge status: Home / Self Care | Payer: Self-pay | Source: Home / Self Care | Attending: Obstetrics & Gynecology

## 2022-04-24 ENCOUNTER — Ambulatory Visit (HOSPITAL_COMMUNITY): Payer: 59 | Admitting: Certified Registered Nurse Anesthetist

## 2022-04-24 ENCOUNTER — Emergency Department (HOSPITAL_COMMUNITY): Payer: 59

## 2022-04-24 ENCOUNTER — Ambulatory Visit (HOSPITAL_BASED_OUTPATIENT_CLINIC_OR_DEPARTMENT_OTHER): Payer: 59 | Admitting: Certified Registered Nurse Anesthetist

## 2022-04-24 ENCOUNTER — Other Ambulatory Visit: Payer: Self-pay

## 2022-04-24 ENCOUNTER — Emergency Department (HOSPITAL_COMMUNITY)
Admission: EM | Admit: 2022-04-24 | Discharge: 2022-04-25 | Disposition: A | Discharge status: Home / Self Care | Payer: 59 | Source: Home / Self Care | Attending: Emergency Medicine | Admitting: Emergency Medicine

## 2022-04-24 ENCOUNTER — Ambulatory Visit (HOSPITAL_COMMUNITY)
Admission: RE | Admit: 2022-04-24 | Discharge: 2022-04-24 | Disposition: A | Discharge status: Home / Self Care | Payer: 59 | Attending: Obstetrics & Gynecology | Admitting: Obstetrics & Gynecology

## 2022-04-24 ENCOUNTER — Encounter (HOSPITAL_COMMUNITY): Payer: Self-pay | Admitting: Obstetrics & Gynecology

## 2022-04-24 ENCOUNTER — Encounter (HOSPITAL_COMMUNITY): Payer: Self-pay

## 2022-04-24 DIAGNOSIS — R109 Unspecified abdominal pain: Secondary | ICD-10-CM | POA: Diagnosis not present

## 2022-04-24 DIAGNOSIS — G8929 Other chronic pain: Secondary | ICD-10-CM | POA: Diagnosis not present

## 2022-04-24 DIAGNOSIS — G8918 Other acute postprocedural pain: Secondary | ICD-10-CM

## 2022-04-24 DIAGNOSIS — R0789 Other chest pain: Secondary | ICD-10-CM | POA: Diagnosis not present

## 2022-04-24 DIAGNOSIS — N9489 Other specified conditions associated with female genital organs and menstrual cycle: Secondary | ICD-10-CM | POA: Diagnosis not present

## 2022-04-24 DIAGNOSIS — Z302 Encounter for sterilization: Secondary | ICD-10-CM | POA: Diagnosis not present

## 2022-04-24 DIAGNOSIS — Z87891 Personal history of nicotine dependence: Secondary | ICD-10-CM | POA: Diagnosis not present

## 2022-04-24 DIAGNOSIS — N921 Excessive and frequent menstruation with irregular cycle: Secondary | ICD-10-CM

## 2022-04-24 DIAGNOSIS — R52 Pain, unspecified: Secondary | ICD-10-CM | POA: Diagnosis not present

## 2022-04-24 DIAGNOSIS — R194 Change in bowel habit: Secondary | ICD-10-CM

## 2022-04-24 DIAGNOSIS — Q505 Embryonic cyst of broad ligament: Secondary | ICD-10-CM | POA: Insufficient documentation

## 2022-04-24 DIAGNOSIS — N946 Dysmenorrhea, unspecified: Secondary | ICD-10-CM

## 2022-04-24 DIAGNOSIS — Z0001 Encounter for general adult medical examination with abnormal findings: Secondary | ICD-10-CM

## 2022-04-24 DIAGNOSIS — R1084 Generalized abdominal pain: Secondary | ICD-10-CM | POA: Diagnosis not present

## 2022-04-24 DIAGNOSIS — Z743 Need for continuous supervision: Secondary | ICD-10-CM | POA: Diagnosis not present

## 2022-04-24 DIAGNOSIS — Z4589 Encounter for adjustment and management of other implanted devices: Secondary | ICD-10-CM | POA: Diagnosis not present

## 2022-04-24 DIAGNOSIS — R1110 Vomiting, unspecified: Secondary | ICD-10-CM | POA: Insufficient documentation

## 2022-04-24 DIAGNOSIS — R079 Chest pain, unspecified: Secondary | ICD-10-CM | POA: Diagnosis not present

## 2022-04-24 HISTORY — PX: LAPAROSCOPIC BILATERAL SALPINGECTOMY: SHX5889

## 2022-04-24 HISTORY — PX: DILATATION AND CURETTAGE/HYSTEROSCOPY WITH MINERVA: SHX6851

## 2022-04-24 LAB — BASIC METABOLIC PANEL
Anion gap: 7 (ref 5–15)
BUN: 8 mg/dL (ref 6–20)
CO2: 21 mmol/L — ABNORMAL LOW (ref 22–32)
Calcium: 9.1 mg/dL (ref 8.9–10.3)
Chloride: 108 mmol/L (ref 98–111)
Creatinine, Ser: 0.68 mg/dL (ref 0.44–1.00)
GFR, Estimated: >60 mL/min (ref >60)
Glucose, Bld: 180 mg/dL — ABNORMAL HIGH (ref 70–99)
Potassium: 3.2 mmol/L — ABNORMAL LOW (ref 3.5–5.1)
Sodium: 136 mmol/L (ref 135–145)

## 2022-04-24 LAB — CBC
HCT: 37.5 % (ref 36.0–46.0)
Hemoglobin: 12.7 g/dL (ref 12.0–15.0)
MCH: 28.2 pg (ref 26.0–34.0)
MCHC: 33.9 g/dL (ref 30.0–36.0)
MCV: 83.1 fL (ref 80.0–100.0)
Platelets: 287 10*3/uL (ref 150–400)
RBC: 4.51 MIL/uL (ref 3.87–5.11)
RDW: 13.4 % (ref 11.5–15.5)
WBC: 9.8 10*3/uL (ref 4.0–10.5)
nRBC: 0 % (ref 0.0–0.2)

## 2022-04-24 LAB — TROPONIN I (HIGH SENSITIVITY): Troponin I (High Sensitivity): <2 ng/L (ref <18)

## 2022-04-24 LAB — POCT PREGNANCY, URINE: Preg Test, Ur: NEGATIVE

## 2022-04-24 LAB — D-DIMER, QUANTITATIVE: D-Dimer, Quant: 0.76 ug/mL-FEU — ABNORMAL HIGH (ref 0.00–0.50)

## 2022-04-24 SURGERY — DILATATION AND CURETTAGE/HYSTEROSCOPY WITH MINERVA
Anesthesia: General | Site: Vagina

## 2022-04-24 MED ORDER — POVIDONE-IODINE 10 % EX SWAB: 2.0000 "application " | Freq: Once | CUTANEOUS | Status: DC

## 2022-04-24 MED ORDER — OXYCODONE HCL 5 MG/5ML PO SOLN: 5.0000 mg | Freq: Once | ORAL | Status: AC | PRN

## 2022-04-24 MED ORDER — BUPIVACAINE LIPOSOME 1.3 % IJ SUSP
INTRAMUSCULAR | Status: DC | PRN
  Administered 2022-04-24: 20 mL

## 2022-04-24 MED ORDER — ONDANSETRON HCL 4 MG/2ML IJ SOLN
4.0000 mg | Freq: Once | INTRAMUSCULAR | Status: AC
  Administered 2022-04-25: 4 mg via INTRAVENOUS
  Filled 2022-04-24: qty 2

## 2022-04-24 MED ORDER — ONDANSETRON HCL 4 MG/2ML IJ SOLN
4.0000 mg | Freq: Once | INTRAMUSCULAR | Status: DC | PRN
Start: 2022-04-24 — End: 2022-04-24
  Filled 2022-04-24: qty 2

## 2022-04-24 MED ORDER — DEXMEDETOMIDINE (PRECEDEX) IN NS 20 MCG/5ML (4 MCG/ML) IV SYRINGE
PREFILLED_SYRINGE | INTRAVENOUS | Status: DC | PRN
  Administered 2022-04-24: 8 ug via INTRAVENOUS
  Administered 2022-04-24: 4 ug via INTRAVENOUS

## 2022-04-24 MED ORDER — ROCURONIUM BROMIDE 10 MG/ML (PF) SYRINGE
PREFILLED_SYRINGE | INTRAVENOUS | Status: DC | PRN
  Administered 2022-04-24: 10 mg via INTRAVENOUS
  Administered 2022-04-24: 5 mg via INTRAVENOUS
  Administered 2022-04-24: 50 mg via INTRAVENOUS

## 2022-04-24 MED ORDER — DEXAMETHASONE SODIUM PHOSPHATE 10 MG/ML IJ SOLN
INTRAMUSCULAR | Status: DC | PRN
  Administered 2022-04-24: 10 mg via INTRAVENOUS

## 2022-04-24 MED ORDER — HYDROCODONE-ACETAMINOPHEN 5-325 MG PO TABS: 1.0000 | ORAL_TABLET | Freq: Four times a day (QID) | ORAL | 0 refills | Status: DC | PRN

## 2022-04-24 MED ORDER — LACTATED RINGERS IV SOLN: INTRAVENOUS | Status: DC | PRN

## 2022-04-24 MED ORDER — ONDANSETRON 8 MG PO TBDP: 8.0000 mg | ORAL_TABLET | Freq: Three times a day (TID) | ORAL | 0 refills | Status: DC | PRN

## 2022-04-24 MED ORDER — LIDOCAINE HCL (PF) 2 % IJ SOLN
INTRAMUSCULAR | Status: AC
  Filled 2022-04-24: qty 5

## 2022-04-24 MED ORDER — FENTANYL CITRATE PF 50 MCG/ML IJ SOSY
25.0000 ug | PREFILLED_SYRINGE | INTRAMUSCULAR | Status: DC | PRN
  Administered 2022-04-24: 50 ug via INTRAVENOUS
  Filled 2022-04-24: qty 1

## 2022-04-24 MED ORDER — DEXAMETHASONE SODIUM PHOSPHATE 10 MG/ML IJ SOLN
INTRAMUSCULAR | Status: AC
  Filled 2022-04-24: qty 2

## 2022-04-24 MED ORDER — FENTANYL CITRATE (PF) 250 MCG/5ML IJ SOLN
INTRAMUSCULAR | Status: AC
  Filled 2022-04-24: qty 5

## 2022-04-24 MED ORDER — SODIUM CHLORIDE 0.9 % IR SOLN
Status: DC | PRN
  Administered 2022-04-24: 1000 mL

## 2022-04-24 MED ORDER — FENTANYL CITRATE (PF) 100 MCG/2ML IJ SOLN
INTRAMUSCULAR | Status: AC
  Filled 2022-04-24: qty 2

## 2022-04-24 MED ORDER — OXYCODONE HCL 5 MG PO TABS
5.0000 mg | ORAL_TABLET | Freq: Once | ORAL | Status: AC | PRN
  Administered 2022-04-24: 5 mg via ORAL
  Filled 2022-04-24 (×2): qty 1

## 2022-04-24 MED ORDER — KETOROLAC TROMETHAMINE 10 MG PO TABS: 10.0000 mg | ORAL_TABLET | Freq: Three times a day (TID) | ORAL | 0 refills | Status: DC | PRN

## 2022-04-24 MED ORDER — MIDAZOLAM HCL 2 MG/2ML IJ SOLN
INTRAMUSCULAR | Status: AC
  Filled 2022-04-24: qty 2

## 2022-04-24 MED ORDER — MIDAZOLAM HCL 2 MG/2ML IJ SOLN
INTRAMUSCULAR | Status: DC | PRN
  Administered 2022-04-24: 2 mg via INTRAVENOUS

## 2022-04-24 MED ORDER — FENTANYL CITRATE (PF) 100 MCG/2ML IJ SOLN
INTRAMUSCULAR | Status: DC | PRN
Start: 2022-04-24 — End: 2022-04-24
  Administered 2022-04-24: 100 ug via INTRAVENOUS
  Administered 2022-04-24 (×3): 50 ug via INTRAVENOUS
  Administered 2022-04-24: 25 ug via INTRAVENOUS
  Administered 2022-04-24: 75 ug via INTRAVENOUS

## 2022-04-24 MED ORDER — BUPIVACAINE LIPOSOME 1.3 % IJ SUSP
20.0000 mL | Freq: Once | INTRAMUSCULAR | Status: DC
  Filled 2022-04-24: qty 20

## 2022-04-24 MED ORDER — PROPOFOL 10 MG/ML IV BOLUS
INTRAVENOUS | Status: DC | PRN
  Administered 2022-04-24: 180 mg via INTRAVENOUS

## 2022-04-24 MED ORDER — MORPHINE SULFATE (PF) 4 MG/ML IV SOLN
4.0000 mg | Freq: Once | INTRAVENOUS | Status: AC
  Administered 2022-04-25: 4 mg via INTRAVENOUS
  Filled 2022-04-24: qty 1

## 2022-04-24 MED ORDER — LIDOCAINE HCL (CARDIAC) PF 100 MG/5ML IV SOSY
PREFILLED_SYRINGE | INTRAVENOUS | Status: DC | PRN
  Administered 2022-04-24: 60 mg via INTRATRACHEAL

## 2022-04-24 MED ORDER — 0.9 % SODIUM CHLORIDE (POUR BTL) OPTIME
TOPICAL | Status: DC | PRN
  Administered 2022-04-24: 1000 mL

## 2022-04-24 MED ORDER — CEFAZOLIN SODIUM-DEXTROSE 2-4 GM/100ML-% IV SOLN
2.0000 g | INTRAVENOUS | Status: AC
  Administered 2022-04-24: 2 g via INTRAVENOUS
  Filled 2022-04-24: qty 100

## 2022-04-24 MED ORDER — ONDANSETRON HCL 4 MG/2ML IJ SOLN
INTRAMUSCULAR | Status: AC
  Filled 2022-04-24: qty 6

## 2022-04-24 MED ORDER — KETOROLAC TROMETHAMINE 30 MG/ML IJ SOLN
30.0000 mg | Freq: Once | INTRAMUSCULAR | Status: AC
  Administered 2022-04-24: 30 mg via INTRAVENOUS
  Filled 2022-04-24: qty 1

## 2022-04-24 MED ORDER — ONDANSETRON HCL 4 MG/2ML IJ SOLN
INTRAMUSCULAR | Status: DC | PRN
  Administered 2022-04-24: 4 mg via INTRAVENOUS

## 2022-04-24 MED ORDER — ROCURONIUM BROMIDE 10 MG/ML (PF) SYRINGE
PREFILLED_SYRINGE | INTRAVENOUS | Status: AC
  Filled 2022-04-24: qty 10

## 2022-04-24 SURGICAL SUPPLY — 64 items
ADH SKN CLS APL DERMABOND .7 (GAUZE/BANDAGES/DRESSINGS) ×4
APL SWBSTK 6 STRL LF DISP (MISCELLANEOUS) ×2
APPLICATOR COTTON TIP 6 STRL (MISCELLANEOUS) IMPLANT
APPLICATOR COTTON TIP 6IN STRL (MISCELLANEOUS) ×3
BAG HAMPER (MISCELLANEOUS) ×3 IMPLANT
BLADE SURG SZ11 CARB STEEL (BLADE) ×3 IMPLANT
CLOTH BEACON ORANGE TIMEOUT ST (SAFETY) ×3 IMPLANT
COVER LIGHT HANDLE STERIS (MISCELLANEOUS) ×6 IMPLANT
COVER MAYO STAND XLG (MISCELLANEOUS) ×1 IMPLANT
DERMABOND ADVANCED (GAUZE/BANDAGES/DRESSINGS) ×2
DERMABOND ADVANCED .7 DNX12 (GAUZE/BANDAGES/DRESSINGS) ×2 IMPLANT
ELECT REM PT RETURN 9FT ADLT (ELECTROSURGICAL) ×3
ELECTRODE REM PT RTRN 9FT ADLT (ELECTROSURGICAL) ×2 IMPLANT
GAUZE 4X4 16PLY ~~LOC~~+RFID DBL (SPONGE) ×7 IMPLANT
GLOVE BIO SURGEON STRL SZ 6.5 (GLOVE) ×1 IMPLANT
GLOVE BIOGEL PI IND STRL 6.5 (GLOVE) IMPLANT
GLOVE BIOGEL PI IND STRL 7.0 (GLOVE) ×4 IMPLANT
GLOVE BIOGEL PI IND STRL 8 (GLOVE) ×2 IMPLANT
GLOVE BIOGEL PI INDICATOR 6.5 (GLOVE) ×1
GLOVE BIOGEL PI INDICATOR 7.0 (GLOVE) ×3
GLOVE BIOGEL PI INDICATOR 8 (GLOVE) ×1
GLOVE ECLIPSE 8.0 STRL XLNG CF (GLOVE) ×6 IMPLANT
GLOVE SRG 8 PF TXTR STRL LF DI (GLOVE) ×2 IMPLANT
GLOVE SS BIOGEL STRL SZ 6.5 (GLOVE) IMPLANT
GLOVE SUPERSENSE BIOGEL SZ 6.5 (GLOVE) ×2
GLOVE SURG UNDER POLY LF SZ8 (GLOVE) ×3
GOWN STRL REUS W/TWL LRG LVL3 (GOWN DISPOSABLE) ×3 IMPLANT
GOWN STRL REUS W/TWL XL LVL3 (GOWN DISPOSABLE) ×3 IMPLANT
HANDPIECE ABLA MINERVA ENDO (MISCELLANEOUS) ×4 IMPLANT
INST SET HYSTEROSCOPY (KITS) ×3 IMPLANT
INST SET LAPROSCOPIC GYN AP (KITS) ×3 IMPLANT
IV NS 1000ML (IV SOLUTION) ×3
IV NS 1000ML BAXH (IV SOLUTION) ×2 IMPLANT
KIT TURNOVER CYSTO (KITS) ×3 IMPLANT
MANIFOLD NEPTUNE II (INSTRUMENTS) ×3 IMPLANT
MARKER SKIN DUAL TIP RULER LAB (MISCELLANEOUS) ×3 IMPLANT
NDL HYPO 18GX1.5 BLUNT FILL (NEEDLE) ×2 IMPLANT
NDL HYPO 21X1.5 SAFETY (NEEDLE) ×2 IMPLANT
NDL INSUFFLATION 14GA 120MM (NEEDLE) ×2 IMPLANT
NEEDLE HYPO 18GX1.5 BLUNT FILL (NEEDLE) ×3 IMPLANT
NEEDLE HYPO 21X1.5 SAFETY (NEEDLE) ×3 IMPLANT
NEEDLE INSUFFLATION 14GA 120MM (NEEDLE) ×3 IMPLANT
NS IRRIG 1000ML POUR BTL (IV SOLUTION) ×3 IMPLANT
PACK BASIC III (CUSTOM PROCEDURE TRAY) ×3
PACK PERI GYN (CUSTOM PROCEDURE TRAY) ×3 IMPLANT
PACK SRG BSC III STRL LF ECLPS (CUSTOM PROCEDURE TRAY) ×2 IMPLANT
PAD ARMBOARD 7.5X6 YLW CONV (MISCELLANEOUS) ×3 IMPLANT
PAD TELFA 3X4 1S STER (GAUZE/BANDAGES/DRESSINGS) ×3 IMPLANT
SET BASIN LINEN APH (SET/KITS/TRAYS/PACK) ×3 IMPLANT
SET CYSTO W/LG BORE CLAMP LF (SET/KITS/TRAYS/PACK) ×3 IMPLANT
SET TUBE SMOKE EVAC HIGH FLOW (TUBING) ×3 IMPLANT
SHEARS HARMONIC ACE PLUS 36CM (ENDOMECHANICALS) ×3 IMPLANT
SHEET LAVH (DRAPES) ×3 IMPLANT
SLEEVE Z-THREAD 5X100MM (TROCAR) ×3 IMPLANT
SOL ANTI FOG 6CC (MISCELLANEOUS) ×2 IMPLANT
SOLUTION ANTI FOG 6CC (MISCELLANEOUS) ×1
SUT VICRYL 0 UR6 27IN ABS (SUTURE) ×3 IMPLANT
SUT VICRYL AB 3-0 FS1 BRD 27IN (SUTURE) ×3 IMPLANT
SYR 10ML LL (SYRINGE) ×3 IMPLANT
SYR 20ML LL LF (SYRINGE) ×6 IMPLANT
TROCAR Z-THRD FIOS HNDL 11X100 (TROCAR) ×3 IMPLANT
TROCAR Z-THREAD FIOS 5X100MM (TROCAR) ×3 IMPLANT
TUBE CONNECTING 12X1/4 (SUCTIONS) ×3 IMPLANT
WARMER LAPAROSCOPE (MISCELLANEOUS) ×3 IMPLANT

## 2022-04-24 NOTE — Anesthesia Preprocedure Evaluation (Addendum)
Anesthesia Evaluation  Patient identified by MRN, date of birth, ID band Patient awake    Reviewed: Allergy & Precautions, H&P , NPO status , Patient's Chart, lab work & pertinent test results, reviewed documented beta blocker date and time   Airway Mallampati: II  TM Distance: >3 FB Neck ROM: full    Dental no notable dental hx.    Pulmonary neg pulmonary ROS, former smoker,    Pulmonary exam normal breath sounds clear to auscultation       Cardiovascular Exercise Tolerance: Good negative cardio ROS   Rhythm:regular Rate:Normal     Neuro/Psych negative neurological ROS  negative psych ROS   GI/Hepatic Neg liver ROS, GERD  Medicated,  Endo/Other  negative endocrine ROS  Renal/GU negative Renal ROS  negative genitourinary   Musculoskeletal   Abdominal   Peds  Hematology negative hematology ROS (+)   Anesthesia Other Findings   Reproductive/Obstetrics negative OB ROS                             Anesthesia Physical Anesthesia Plan  ASA: 2  Anesthesia Plan: General   Post-op Pain Management:    Induction:   PONV Risk Score and Plan: Ondansetron  Airway Management Planned: Oral ETT  Additional Equipment:   Intra-op Plan:   Post-operative Plan:   Informed Consent: I have reviewed the patients History and Physical, chart, labs and discussed the procedure including the risks, benefits and alternatives for the proposed anesthesia with the patient or authorized representative who has indicated his/her understanding and acceptance.     Dental Advisory Given  Plan Discussed with: CRNA  Anesthesia Plan Comments:        Anesthesia Quick Evaluation

## 2022-04-24 NOTE — Transfer of Care (Signed)
Immediate Anesthesia Transfer of Care Note  Patient: Kristina Delacruz  Procedure(s) Performed: DILATATION AND CURETTAGE/HYSTEROSCOPY WITH MINERVA (Vagina ) LAPAROSCOPIC BILATERAL SALPINGECTOMY; removal foreign object (Abdomen)  Patient Location: PACU  Anesthesia Type:General  Level of Consciousness: awake  Airway & Oxygen Therapy: Patient Spontanous Breathing and Patient connected to nasal cannula oxygen  Post-op Assessment: Report given to RN and Post -op Vital signs reviewed and stable  Post vital signs: Reviewed and stable  Last Vitals:  Vitals Value Taken Time  BP 119/76   Temp 98.1   Pulse 92 04/24/22 1300  Resp 9 04/24/22 1300  SpO2 96 % 04/24/22 1300  Vitals shown include unvalidated device data.  Last Pain:  Vitals:   04/24/22 0948  PainSc: 0-No pain         Complications: No notable events documented.

## 2022-04-24 NOTE — ED Provider Notes (Signed)
[facility] EMERGENCY DEPARTMENT  Provider Note  CSN: [id number] Arrival date & time: 04/24/22 2156  History Chief Complaint  Patient presents with   Chest Pain    Kristina Delacruz is a 41 y.o. female is several hours post uterine ablation and laparoscopic tubal ligation. She reports diffuse upper abdomen and lower chest pain, radiating into shoulder. Associated with vomiting x 1. Pain is worse with deep inspiration. She has taken the prescribed pain medications without improvement.    Home Medications Prior to Admission medications   Medication Sig Start Date End Date Taking? Authorizing Provider  Acetaminophen-Caff-Pyrilamine (MIDOL COMPLETE) 500-60-15 MG TABS Take 1 tablet by mouth in the morning.    [provider]  Drospirenone (SLYND) 4 MG TABS Take 1 tablet by mouth daily. 11/27/21   Adline Potter, NP  esomeprazole (NEXIUM) 20 MG capsule Take 20 mg by mouth daily as needed.    [provider]  fexofenadine (ALLEGRA) 180 MG tablet Take 180 mg by mouth in the morning.    [provider]  HYDROcodone-acetaminophen (NORCO/VICODIN) 5-325 MG tablet Take 1 tablet by mouth every 6 (six) hours as needed. 04/24/22   Lazaro Arms, MD  ketorolac (TORADOL) 10 MG tablet Take 1 tablet (10 mg total) by mouth every 8 (eight) hours as needed. 04/24/22   Lazaro Arms, MD  naproxen sodium (ALEVE) 220 MG tablet Take 220 mg by mouth daily as needed (pain.).    [provider]  omeprazole (PRILOSEC) 40 MG capsule Take 40 mg by mouth daily before breakfast. 03/18/22   [provider]  ondansetron (ZOFRAN-ODT) 8 MG disintegrating tablet Take 1 tablet (8 mg total) by mouth every 8 (eight) hours as needed for nausea or vomiting. 04/24/22   Lazaro Arms, MD  traZODone (DESYREL) 50 MG tablet TAKE 1 TABLET BY MOUTH EVERYDAY AT BEDTIME 04/09/22   Adline Potter, NP  diphenhydrAMINE (BENADRYL) 25 MG tablet Take 25 mg by mouth every 6 (six) hours as needed.   05/14/13  [provider]     Allergies    Bee venom and Onion   Review of Systems   Review of Systems Please see HPI for pertinent positives and negatives  Physical Exam BP 125/79   Pulse 89   Temp 97.9 F (36.6 C)   Resp 15   SpO2 100%   Physical Exam Vitals and nursing note reviewed.  Constitutional:      Appearance: Normal appearance.  HENT:     Head: Normocephalic and atraumatic.     Nose: Nose normal.     Mouth/Throat:     Mouth: Mucous membranes are moist.  Eyes:     Extraocular Movements: Extraocular movements intact.     Conjunctiva/sclera: Conjunctivae normal.  Cardiovascular:     Rate and Rhythm: Normal rate and regular rhythm.  Pulmonary:     Effort: Pulmonary effort is normal.     Breath sounds: Normal breath sounds. No wheezing or rales.  Abdominal:     Palpations: Abdomen is soft.     Tenderness: There is abdominal tenderness (diffuse lower). There is no guarding.  Musculoskeletal:        General: No swelling. Normal range of motion.     Cervical back: Neck supple.  Skin:    General: Skin is warm and dry.  Neurological:     General: No focal deficit present.     Mental Status: She is alert.  Psychiatric:  Mood and Affect: Mood normal.     ED Results / Procedures / Treatments   EKG EKG Interpretation  Date/Time:  Wednesday April 24 2022 22:21:29 EDT Ventricular Rate:  99 PR Interval:  137 QRS Duration: 93 QT Interval:  341 QTC Calculation: 438 R Axis:   32 Text Interpretation: Sinus rhythm RSR' in V1 or V2, probably normal variant Baseline wander in lead(s) V1 Since last tracing Rate faster Confirmed by Susy Frizzle [phone]) on 04/24/2022 11:47:23 PM  Procedures Procedures  Medications Ordered in the ED Medications  morphine (PF) 4 MG/ML injection 4 mg (4 mg Intravenous Given 04/25/22 0014)  ondansetron (ZOFRAN) injection 4 mg (4 mg Intravenous Given 04/25/22 0014)  iohexol (OMNIPAQUE) 350 MG/ML injection 100 mL (100  mLs Intravenous Contrast Given 04/25/22 0022)    Initial Impression and Plan  Patient here with upper abdomen and lower chest pain after laparoscopic surgery. Most likely due to residual CO2 and diaphragmatic irritation. Labs done in triage showed and normal CBC, BMP and Trop however a d-dimer was also ordered and is positive. She is too young for age adjustment and given her symptoms as well as persistent abdominal pain, will need CT to rule out PE or acute surgical complications.   ED Course   Clinical Course as of 04/25/22 0159  Thu Apr 25, 2022  0157 I personally viewed the images from radiology studies and agree with radiologist interpretation: CT discussed with radiologist. Some residual air from recent laparoscopy but otherwise no acute findings. Patient counseled that this will resolve on its own in a few days. She has pain meds she can take at home. Recommend follow up with Dr. Despina Hidden if not improving. RTED for any other concerns.   [CS]    Clinical Course User Index [CS] Pollyann Savoy, MD     MDM Rules/Calculators/A&P Medical Decision Making Given presenting complaint, I considered that admission might be necessary. After review of results from ED lab and/or imaging studies, admission to the hospital is not indicated at this time.    Problems Addressed: Post-op pain: acute illness or injury  Amount and/or Complexity of Data Reviewed Labs: ordered. Decision-making details documented in ED Course. Radiology: ordered and independent interpretation performed. Decision-making details documented in ED Course.  Risk Prescription drug management. Parenteral controlled substances. Decision regarding hospitalization.    Final Clinical Impression(s) / ED Diagnoses Final diagnoses:  Post-op pain    Rx / DC Orders ED Discharge Orders     None        Pollyann Savoy, MD 04/25/22 0159

## 2022-04-24 NOTE — H&P (Signed)
Preoperative History and Physical  Kristina Delacruz is a 41 y.o. G1P1001 with No LMP recorded. (Menstrual status: Oral contraceptives). admitted for a hysteroscopy uterine curettage MInerva endometrial ablation, removal of the ESSURE coils, laparoscopic bilateral salpingectomy, indicated procedures.   Pt has heavy, long painful periods and some degree of chronic pelvic pain on the left all or at least part of which is attributable to her ESSURE coils.  The plan is as above sut she knows with the coils hysterectomy may be needed depending on their adherence properties  PMH:    Past Medical History:  Diagnosis Date   GERD (gastroesophageal reflux disease)    Trichimoniasis 08/13/2021   08/13/21 treated with flagyl.    PSH:     Past Surgical History:  Procedure Laterality Date   ESSURE TUBAL LIGATION Bilateral    TUBAL LIGATION      POb/GynH:      OB History     Gravida  1   Para  1   Term  1   Preterm      AB      Living  1      SAB      IAB      Ectopic      Multiple      Live Births  1           SH:   Social History   Tobacco Use   Smoking status: Former    Types: Cigarettes   Smokeless tobacco: Never  Vaping Use   Vaping Use: Never used  Substance Use Topics   Alcohol use: Not Currently   Drug use: No    FH:    Family History  Problem Relation Age of Onset   Hypertension Maternal Grandmother    Hypertension Father    Thyroid disease Mother    Hypercholesterolemia Sister    Diabetes Daughter    Thyroid disease Paternal Aunt    Thyroid disease Paternal Uncle      Allergies:  Allergies  Allergen Reactions   Bee Venom Swelling   Onion Rash    Medications:       Current Facility-Administered Medications:    bupivacaine liposome (EXPAREL) 1.3 % injection 266 mg, 20 mL, Infiltration, Once, Dominick Morella, Amaryllis DykeLuther H, MD   ceFAZolin (ANCEF) IVPB 2g/100 mL premix, 2 g, Intravenous, On Call to OR, Lazaro ArmsEure, Winferd Wease H, MD   povidone-iodine 10 % swab 2  application , 2 application , Topical, Once, Lazaro ArmsEure, Samvel Zinn H, MD  Review of Systems:   Review of Systems  Constitutional: Negative for fever, chills, weight loss, malaise/fatigue and diaphoresis.  HENT: Negative for hearing loss, ear pain, nosebleeds, congestion, sore throat, neck pain, tinnitus and ear discharge.   Eyes: Negative for blurred vision, double vision, photophobia, pain, discharge and redness.  Respiratory: Negative for cough, hemoptysis, sputum production, shortness of breath, wheezing and stridor.   Cardiovascular: Negative for chest pain, palpitations, orthopnea, claudication, leg swelling and PND.  Gastrointestinal: Positive for abdominal pain. Negative for heartburn, nausea, vomiting, diarrhea, constipation, blood in stool and melena.  Genitourinary: Negative for dysuria, urgency, frequency, hematuria and flank pain.  Musculoskeletal: Negative for myalgias, back pain, joint pain and falls.  Skin: Negative for itching and rash.  Neurological: Negative for dizziness, tingling, tremors, sensory change, speech change, focal weakness, seizures, loss of consciousness, weakness and headaches.  Endo/Heme/Allergies: Negative for environmental allergies and polydipsia. Does not bruise/bleed easily.  Psychiatric/Behavioral: Negative for depression, suicidal ideas, hallucinations, memory loss and substance  abuse. The patient is not nervous/anxious and does not have insomnia.      PHYSICAL EXAM:  Blood pressure (!) 144/95, pulse 79, temperature 98 F (36.7 C), resp. rate 19, SpO2 100 %.    Vitals reviewed. Constitutional: She is oriented to person, place, and time. She appears well-developed and well-nourished.  HENT:  Head: Normocephalic and atraumatic.  Right Ear: External ear normal.  Left Ear: External ear normal.  Nose: Nose normal.  Mouth/Throat: Oropharynx is clear and moist.  Eyes: Conjunctivae and EOM are normal. Pupils are equal, round, and reactive to light. Right eye  exhibits no discharge. Left eye exhibits no discharge. No scleral icterus.  Neck: Normal range of motion. Neck supple. No tracheal deviation present. No thyromegaly present.  Cardiovascular: Normal rate, regular rhythm, normal heart sounds and intact distal pulses.  Exam reveals no gallop and no friction rub.   No murmur heard. Respiratory: Effort normal and breath sounds normal. No respiratory distress. She has no wheezes. She has no rales. She exhibits no tenderness.  GI: Soft. Bowel sounds are normal. She exhibits no distension and no mass. There is tenderness. There is no rebound and no guarding.  Genitourinary:       Vulva is normal without lesions Vagina is pink moist without discharge Cervix normal in appearance and pap is normal Uterus is normal size, contour, position, consistency, mobility, non-tender  ESSURE is in tubes on sonogram Adnexa is negative with normal sized ovaries by sonogram  Musculoskeletal: Normal range of motion. She exhibits no edema and no tenderness.  Neurological: She is alert and oriented to person, place, and time. She has normal reflexes. She displays normal reflexes. No cranial nerve deficit. She exhibits normal muscle tone. Coordination normal.  Skin: Skin is warm and dry. No rash noted. No erythema. No pallor.  Psychiatric: She has a normal mood and affect. Her behavior is normal. Judgment and thought content normal.    Labs: Results for orders placed or performed during the hospital encounter of 04/22/22 (from the past 336 hour(s))  CBC   Collection Time: 04/22/22  8:21 AM  Result Value Ref Range   WBC 5.7 4.0 - 10.5 K/uL   RBC 4.53 3.87 - 5.11 MIL/uL   Hemoglobin 12.9 12.0 - 15.0 g/dL   HCT 48.1 85.6 - 31.4 %   MCV 83.9 80.0 - 100.0 fL   MCH 28.5 26.0 - 34.0 pg   MCHC 33.9 30.0 - 36.0 g/dL   RDW 97.0 26.3 - 78.5 %   Platelets 295 150 - 400 K/uL   nRBC 0.0 0.0 - 0.2 %  Comprehensive metabolic panel   Collection Time: 04/22/22  8:21 AM  Result  Value Ref Range   Sodium 137 135 - 145 mmol/L   Potassium 3.1 (L) 3.5 - 5.1 mmol/L   Chloride 110 98 - 111 mmol/L   CO2 21 (L) 22 - 32 mmol/L   Glucose, Bld 75 70 - 99 mg/dL   BUN 9 6 - 20 mg/dL   Creatinine, Ser 8.85 0.44 - 1.00 mg/dL   Calcium 9.0 8.9 - 02.7 mg/dL   Total Protein 7.3 6.5 - 8.1 g/dL   Albumin 3.9 3.5 - 5.0 g/dL   AST 12 (L) 15 - 41 U/L   ALT 13 0 - 44 U/L   Alkaline Phosphatase 50 38 - 126 U/L   Total Bilirubin 0.4 0.3 - 1.2 mg/dL   GFR, Estimated >74 >12 mL/min   Anion gap 6 5 - 15  Rapid HIV screen (HIV 1/2 Ab+Ag)   Collection Time: 04/22/22  8:21 AM  Result Value Ref Range   HIV-1 P24 Antigen - HIV24 NON REACTIVE NON REACTIVE   HIV 1/2 Antibodies NON REACTIVE NON REACTIVE   Interpretation (HIV Ag Ab)      A non reactive test result means that HIV 1 or HIV 2 antibodies and HIV 1 p24 antigen were not detected in the specimen.  Urinalysis, Routine w reflex microscopic Urine, Clean Catch   Collection Time: 04/22/22  8:21 AM  Result Value Ref Range   Color, Urine YELLOW YELLOW   APPearance CLOUDY (A) CLEAR   Specific Gravity, Urine 1.017 1.005 - 1.030   pH 6.0 5.0 - 8.0   Glucose, UA NEGATIVE NEGATIVE mg/dL   Hgb urine dipstick MODERATE (A) NEGATIVE   Bilirubin Urine NEGATIVE NEGATIVE   Ketones, ur NEGATIVE NEGATIVE mg/dL   Protein, ur NEGATIVE NEGATIVE mg/dL   Nitrite NEGATIVE NEGATIVE   Leukocytes,Ua NEGATIVE NEGATIVE   RBC / HPF 0-5 0 - 5 RBC/hpf   WBC, UA 0-5 0 - 5 WBC/hpf   Bacteria, UA MANY (A) NONE SEEN   Squamous Epithelial / LPF 21-50 0 - 5   Mucus PRESENT   Pregnancy, urine   Collection Time: 04/22/22  8:21 AM  Result Value Ref Range   Preg Test, Ur NEGATIVE NEGATIVE  Results for orders placed or performed in visit on 04/15/22 (from the past 336 hour(s))  Surgical pathology( [place]/ [facility])   Collection Time: 04/15/22 11:33 AM  Result Value Ref Range   SURGICAL PATHOLOGY      SURGICAL PATHOLOGY CASE: [id number] PATIENT:  Elmarie Mainland Surgical Pathology Report     Clinical History: menometrorrhagia, dysmenorrhea (cm)     FINAL MICROSCOPIC DIAGNOSIS:  A.   ENDOMETRIUM, BIOPSY: -    Fragments of weakly reactive endometrium. -    Negative for endometrial hyperplasia, intraepithelial neoplasia ((EIN) and malignancy.    GROSS DESCRIPTION:  Received in formalin is a 1.5 x 1.3 x 0.2 cm aggregate of pink-red soft tissue/material and pink-gray mucoid material.  Submitted in 1 block.  SW 04/15/2022   Final Diagnosis performed by Napoleon Form, MD.   Electronically signed 04/16/2022 Technical and / or Professional components performed at [facility]. [facility], [address] [place], [place] [address].  Immunohistochemistry Technical component (if applicable) was performed at [facility]. [address], [address], [place], [place] [address].   IMMUNOHISTOCHEMISTRY DISCLAIMER (if applicable): Some of these  immunohistochemical stains may have been developed and the performance characteristics determine by [facility] Some may not have been cleared or approved by the U.S. Food and Drug Administration. The FDA has determined that such clearance or approval is not necessary. This test is used for clinical purposes. It should not be regarded as investigational or for research. This laboratory is certified under the Clinical Laboratory Improvement Amendments of 1988 (CLIA-88) as qualified to perform high complexity clinical laboratory testing.  The controls stained appropriately.   POCT urine pregnancy   Collection Time: 04/15/22 12:18 PM  Result Value Ref Range   Preg Test, Ur Negative Negative    EKG: Orders placed or performed during the hospital encounter of 04/22/22   EKG 12-Lead   EKG 12-Lead    Imaging Studies: No results found.    Assessment: Menometrorrhagia Dysmenorrhea LLQ pain, chronic, normal sonogram All attributable to the ESSURE  possible  Plan: As stated above Hysteroscopy uterine curettage Minerva ablation Removal  of ESSURE, likely laparoscopically Laparoscopic bilateral salpingectomy Low threshold for left ovarian removal dure to symptoms, felt to be the ESSURE  Lazaro Arms 04/24/2022 10:18 AM

## 2022-04-24 NOTE — Anesthesia Procedure Notes (Signed)
Procedure Name: Intubation Date/Time: 04/24/2022 10:59 AM  Performed by: Karna Dupes, CRNAPre-anesthesia Checklist: Patient identified, Emergency Drugs available, Suction available and Patient being monitored Patient Re-evaluated:Patient Re-evaluated prior to induction Oxygen Delivery Method: Circle system utilized Preoxygenation: Pre-oxygenation with 100% oxygen Induction Type: IV induction Ventilation: Mask ventilation without difficulty Laryngoscope Size: Mac and 3 Grade View: Grade I Tube type: Oral Tube size: 7.0 mm Number of attempts: 1 Airway Equipment and Method: Stylet Placement Confirmation: ETT inserted through vocal cords under direct vision, positive ETCO2 and breath sounds checked- equal and bilateral Secured at: 21 cm Tube secured with: Tape Dental Injury: Teeth and Oropharynx as per pre-operative assessment

## 2022-04-24 NOTE — ED Notes (Signed)
Ambulatory to restroom and back

## 2022-04-24 NOTE — ED Triage Notes (Signed)
Rcems from home. Cc of chest pain and right arm pain. Sharp pain. Worse with breathing and movement.  Has a uterine ablation today. Took vicodin and zofran around 6pm  took nexium too.  ST and hypertensive with ems

## 2022-04-24 NOTE — Op Note (Signed)
Preoperative diagnosis:  1.   menometrorrhagia                                         2.  dysmenorrhea   Postoperative diagnoses: Same as above   Procedure: Hysteroscopy, uterine curettage, endometrial ablation using Minerva  Surgeon: Lazaro Arms   Anesthesia: Laryngeal mask airway  Findings: The endometrium was normal. There were no fibroid or other abnormalities.  The ESSURE was not visible via hysteroscopy  Description of operation: The patient was taken to the operating room and placed in the supine position. She underwent general anesthesia using the laryngeal mask airway. She was placed in the dorsal lithotomy position and prepped and draped in the usual sterile fashion. A Graves speculum was placed and the anterior cervical lip was grasped with a single-tooth tenaculum. The cervix was dilated serially to allow passage of the hysteroscope. Diagnostic hysteroscopy was performed and was found to be normal. A vigorous uterine curettage was then performed and all tissue sent to pathology for evaluation.  I then proceeded to perform the Minerva endometrial ablation.   The uterus sounded to 7 cm The handpiece was attached to the Minerva power source/machine and the handpiece passed the checklist. The array was squeezed down to remove all of the air present.  The array was then place into the endometrial cavity and deployed to a length of 4.5 cm. The handpiece confirmed appropriate width by being in the green portion of the visual dial. The cervical cuff was then inflated to the point the CO2 indicator was in the green. The endometrial integrity check was then performed and integrity sequence was confirmed x 2. The heating was then begun and carried out for a total of 2 minutes(which is standard therapy time). When the plasma cycle was finished,  the cervical cuff was deflated and the array was removed with tissue present on the silicon membrane. There was appropriate post Minerva bleeding  and uterine discharge.     All of the equipment worked well throughout the procedure.  The patient was awakened from anesthesia and taken to the recovery room in good stable condition all counts were correct. She received 2 g of Ancef and 30 mg of Toradol preoperatively. She will be discharged from the recovery room and followed up in the office in 1- 2 weeks.   She can expect 4 weeks of post procedure bloody watery discharge  Lazaro Arms, MD  04/24/2022 12:58 PM  Preoperative Diagnosis:  Chronic pelvic pain with ESSURE in place                                          Pt desires removal and bilateral salpingectomy  Postoperative Diagnosis:  Same as above  Procedure:  Laparoscopic Bilateral Salpingectomy for the purpose of permanent sterilization  Surgeon:  Rockne Coons MD  Anaesthesia: general  Findings:  Patient had normal pelvic anatomy and no intraperitoneal abnormalities.  As stated in the hysteroscopy portion there were no end plates or coil in the endometrium.  The essure was in the tube completely both cornual and intraperitoneal  Description of Operation:   An incision was made in the umbilicus and dissection taken down to the rectus fascia. A Veres needle was used to insufflate the  periotneal cavity. An 11 mm non bladed video laparoscope trocar was then placed under direct visualization without difficulty.   The above noted findings were observed.   Two additional 5 mm non bladed trocars were placed in the right and left lower quadrants under direct visualization without difficulty.   The Harmonic scalpel was employed and a the mesenteric attachmnet of each tube was taken from lateral to the uterus.  I could easily see the end of the right essure but not the left  I started on the left and dissected with scissors about 2 cm from the cornua and found the essure and pulled.  It accordioned out as expected I was then able to pull the tube away from the essure device,  identifying both coils and the 2 endplates.  I then transected the left tube at the cornua and removed the tube and entire essure from the peritoneal cavity.  The left essure was completely removed from the tube.  On the right I could see the distal end through the tube.  I used the Harmonic and coagulated/cut the mesenteric attachment with good hemostasis all the way to the uterine cornua.  I then used the scissors and cut the tube down to the essure 2 cm from cornua and pulled but the uterine cornua would not release it easlit.  So I dissected some more to allow the large coil to be removed.  I took out a small section of uterine cornua as well to ensure complete removal.. Most of the right essure was in the right distal tub.  I then transected th tube with good hemostasis.  The large and small coils and 2 endplates were identified.   The tubes were removed from the peritoneal cavity and sent to pathology.   There was good hemostasis bilaterally.   The fascia, peritoneum and subcutaneous tissue were closed using 0 vicryl.   All 3 skin incisions were closed using 3-0 vicryl in a subcuticular fashion.  Exparel 266 mg 20 cc was injected in the 3 incisional/trocar sites.  The patient was awakened from anaesthesia and taken to the PACU with all counts being correct x 3.   The patient received  2 gram of ancef andToradol 30 mg IV preoperatively.  Lazaro Arms 04/24/2022 12:59 PM

## 2022-04-25 ENCOUNTER — Emergency Department (HOSPITAL_COMMUNITY): Payer: 59

## 2022-04-25 DIAGNOSIS — R109 Unspecified abdominal pain: Secondary | ICD-10-CM | POA: Diagnosis not present

## 2022-04-25 DIAGNOSIS — R079 Chest pain, unspecified: Secondary | ICD-10-CM | POA: Diagnosis not present

## 2022-04-25 LAB — SURGICAL PATHOLOGY

## 2022-04-25 MED ORDER — IOHEXOL 350 MG/ML SOLN
100.0000 mL | Freq: Once | INTRAVENOUS | Status: AC | PRN
  Administered 2022-04-25: 100 mL via INTRAVENOUS

## 2022-04-26 ENCOUNTER — Encounter (HOSPITAL_COMMUNITY): Payer: Self-pay | Admitting: Obstetrics & Gynecology

## 2022-04-26 NOTE — Anesthesia Postprocedure Evaluation (Signed)
Anesthesia Post Note  Patient: Kristina Delacruz  Procedure(s) Performed: DILATATION AND CURETTAGE/HYSTEROSCOPY WITH MINERVA (Vagina ) LAPAROSCOPIC BILATERAL SALPINGECTOMY; removal foreign object (Abdomen)  Patient location during evaluation: Phase II Anesthesia Type: General Level of consciousness: awake Pain management: pain level controlled Vital Signs Assessment: post-procedure vital signs reviewed and stable Respiratory status: spontaneous breathing and respiratory function stable Cardiovascular status: blood pressure returned to baseline and stable Postop Assessment: no headache and no apparent nausea or vomiting Anesthetic complications: no Comments: Late entry   No notable events documented.   Last Vitals:  Vitals:   04/24/22 1348 04/24/22 1351  BP:  102/62  Pulse: 69   Resp: 12   Temp: 36.6 C   SpO2: 95%     Last Pain:  Vitals:   04/24/22 1348  TempSrc: Oral  PainSc: 5                  Windell Norfolk

## 2022-05-02 ENCOUNTER — Ambulatory Visit (INDEPENDENT_AMBULATORY_CARE_PROVIDER_SITE_OTHER): Payer: 59 | Admitting: Obstetrics & Gynecology

## 2022-05-02 ENCOUNTER — Encounter: Payer: Self-pay | Admitting: Obstetrics & Gynecology

## 2022-05-02 VITALS — BP 145/97 | HR 85 | Wt 168.0 lb

## 2022-05-02 DIAGNOSIS — Z9889 Other specified postprocedural states: Secondary | ICD-10-CM

## 2022-05-07 ENCOUNTER — Encounter: Payer: Self-pay | Admitting: Obstetrics & Gynecology

## 2022-05-23 ENCOUNTER — Other Ambulatory Visit: Payer: Self-pay | Admitting: Nurse Practitioner

## 2022-05-23 ENCOUNTER — Ambulatory Visit (INDEPENDENT_AMBULATORY_CARE_PROVIDER_SITE_OTHER): Payer: 59 | Admitting: Nurse Practitioner

## 2022-05-23 ENCOUNTER — Encounter: Payer: Self-pay | Admitting: Nurse Practitioner

## 2022-05-23 VITALS — BP 138/82 | HR 81 | Ht 62.0 in | Wt 164.0 lb

## 2022-05-23 DIAGNOSIS — T7840XS Allergy, unspecified, sequela: Secondary | ICD-10-CM | POA: Diagnosis not present

## 2022-05-23 DIAGNOSIS — N921 Excessive and frequent menstruation with irregular cycle: Secondary | ICD-10-CM

## 2022-05-23 DIAGNOSIS — H9190 Unspecified hearing loss, unspecified ear: Secondary | ICD-10-CM | POA: Insufficient documentation

## 2022-05-23 DIAGNOSIS — E669 Obesity, unspecified: Secondary | ICD-10-CM | POA: Diagnosis not present

## 2022-05-23 DIAGNOSIS — G4700 Insomnia, unspecified: Secondary | ICD-10-CM | POA: Diagnosis not present

## 2022-05-23 DIAGNOSIS — I10 Essential (primary) hypertension: Secondary | ICD-10-CM | POA: Diagnosis not present

## 2022-05-23 DIAGNOSIS — F321 Major depressive disorder, single episode, moderate: Secondary | ICD-10-CM

## 2022-05-23 DIAGNOSIS — E876 Hypokalemia: Secondary | ICD-10-CM | POA: Diagnosis not present

## 2022-05-23 DIAGNOSIS — T7840XA Allergy, unspecified, initial encounter: Secondary | ICD-10-CM | POA: Insufficient documentation

## 2022-05-23 DIAGNOSIS — R42 Dizziness and giddiness: Secondary | ICD-10-CM

## 2022-05-23 DIAGNOSIS — R109 Unspecified abdominal pain: Secondary | ICD-10-CM | POA: Insufficient documentation

## 2022-05-23 DIAGNOSIS — R69 Illness, unspecified: Secondary | ICD-10-CM | POA: Diagnosis not present

## 2022-05-23 DIAGNOSIS — K219 Gastro-esophageal reflux disease without esophagitis: Secondary | ICD-10-CM | POA: Insufficient documentation

## 2022-05-23 DIAGNOSIS — H9193 Unspecified hearing loss, bilateral: Secondary | ICD-10-CM

## 2022-05-23 MED ORDER — MECLIZINE HCL 12.5 MG PO TABS: 12.5000 mg | ORAL_TABLET | Freq: Three times a day (TID) | ORAL | 0 refills | Status: DC | PRN

## 2022-05-23 MED ORDER — TRAZODONE HCL 100 MG PO TABS: 100.0000 mg | ORAL_TABLET | Freq: Every day | ORAL | 1 refills | Status: DC

## 2022-05-23 MED ORDER — OMEPRAZOLE 40 MG PO CPDR
40.0000 mg | DELAYED_RELEASE_CAPSULE | Freq: Every day | ORAL | 0 refills | Status: DC
Start: 2022-05-23 — End: 2022-05-29

## 2022-05-23 NOTE — Assessment & Plan Note (Signed)
Wt Readings from Last 3 Encounters:  05/23/22 164 lb (74.4 kg)  05/02/22 168 lb (76.2 kg)  04/22/22 166 lb (75.3 kg)  Patient counseled on low-carb diet encouraged to engage in regular moderate exercises at least 150 minutes weekly Importance of portion control also discussed

## 2022-05-23 NOTE — Assessment & Plan Note (Signed)
Start Antivert 12.5 mg 3 times daily as needed Patient referred to ENT

## 2022-05-23 NOTE — Assessment & Plan Note (Signed)
   Underwent hysteroscopy uterine curettage  ablation laparoscopic bilateral salpingectomy a month ago.  She still has some mild abdominal tenderness since her surgery she takes midol as needed.

## 2022-05-23 NOTE — Assessment & Plan Note (Signed)
Currently on trazodone 50 mg daily Start trazodone 100 mg daily

## 2022-05-23 NOTE — Assessment & Plan Note (Addendum)
Intermittent chronic condition Patient referred to ENT ENT examination appears normal today,

## 2022-05-23 NOTE — Assessment & Plan Note (Addendum)
PHQ-9 score 11 On trazodone 50 mg daily for insomnia Start trazodone 100 mg daily Follow-up in 6 weeks Denies SI, HI

## 2022-05-23 NOTE — Assessment & Plan Note (Signed)
Continue Allegra 180 mg daily Avoid allergens

## 2022-05-23 NOTE — Assessment & Plan Note (Signed)
BP Readings from Last 3 Encounters:  05/23/22 138/82  05/02/22 (!) 145/97  04/25/22 125/79  Not on medications DASH diet advised engage in regular moderate exercises daily Patient told to monitor blood pressure at home and call the office if her blood pressure stays consistently greater than 140/90. Recent EKG showed sinus rhythm

## 2022-05-23 NOTE — Progress Notes (Signed)
New Patient Office Visit  Subjective    Patient ID: Kristina Delacruz, female    DOB: 14-May-1981  Age: 41 y.o. MRN: [id number]  CC:  Chief Complaint  Patient presents with   New Patient (Initial Visit)    np   Gastroesophageal Reflux    HPI Kristina Delacruz with past medical history of GERD, insomnia, allergies, dysmenorrhea presents to establish care for her chronic medical conditions.  Previously going to the [facility] , last visit was about 4 months ago .   GERD .patient complains of daily acid reflux  symptoms,  takes Prilosec 40 mg but she has been out for her prescription .patient denies nausea, vomiting, bloody stool   Insomnia.  Chronic condition currently taking trazodone 50 mg daily.  Patient denies snoring states that she gets about 4 hours of sleep nightly  Patient complains of bilateral ears clogging  up , ringing  sensation , nausea  excessive wax , trouble hearing since over 7 years ago, has dizziness when she lays on her stomach , feelings of roof spinning over her when she gest up fast from the bed ,, like a referral to ENT.  Patient currently denies ear pain, fever, chills   Depression .has been having problems with her mood, job cut her hours back since she had her surgery , does not want to do things sometimes and just lays on her bed . Denies SI , HI . She was previously taking slynd 4mg  daily which was helping her mood.    Allergies.taking Allegra 180 mg daily.  Allegra helping her symptoms, denies fever chills sinus pressure.    She is up-to-date with tetanus vaccine Pap smear.  Not sure of when her last annual physical was.    Outpatient Encounter Medications as of 05/23/2022  Medication Sig   Acetaminophen-Caff-Pyrilamine (MIDOL COMPLETE) 500-60-15 MG TABS Take 1 tablet by mouth in the morning.   fexofenadine (ALLEGRA) 180 MG tablet Take 180 mg by mouth in the morning.   meclizine (ANTIVERT) 12.5 MG tablet Take 1 tablet (12.5 mg total) by  mouth 3 (three) times daily as needed for dizziness.   naproxen sodium (ALEVE) 220 MG tablet Take 220 mg by mouth daily as needed (pain.).   traZODone (DESYREL) 100 MG tablet Take 1 tablet (100 mg total) by mouth at bedtime.   [DISCONTINUED] esomeprazole (NEXIUM) 20 MG capsule Take 20 mg by mouth daily as needed.   [DISCONTINUED] omeprazole (PRILOSEC) 40 MG capsule Take 40 mg by mouth daily before breakfast.   [DISCONTINUED] traZODone (DESYREL) 50 MG tablet TAKE 1 TABLET BY MOUTH EVERYDAY AT BEDTIME   Drospirenone (SLYND) 4 MG TABS Take 1 tablet by mouth daily. (Patient not taking: Reported on 05/23/2022)   omeprazole (PRILOSEC) 40 MG capsule Take 1 capsule (40 mg total) by mouth daily before breakfast.   [DISCONTINUED] diphenhydrAMINE (BENADRYL) 25 MG tablet Take 25 mg by mouth every 6 (six) hours as needed.   [DISCONTINUED] HYDROcodone-acetaminophen (NORCO/VICODIN) 5-325 MG tablet Take 1 tablet by mouth every 6 (six) hours as needed. (Patient not taking: Reported on 05/23/2022)   [DISCONTINUED] ketorolac (TORADOL) 10 MG tablet Take 1 tablet (10 mg total) by mouth every 8 (eight) hours as needed. (Patient not taking: Reported on 05/02/2022)   [DISCONTINUED] ondansetron (ZOFRAN-ODT) 8 MG disintegrating tablet Take 1 tablet (8 mg total) by mouth every 8 (eight) hours as needed for nausea or vomiting. (Patient not taking: Reported on 05/02/2022)   No facility-administered encounter medications on  file as of 05/23/2022.    Past Medical History:  Diagnosis Date   GERD (gastroesophageal reflux disease)    Trichimoniasis 08/13/2021   08/13/21 treated with flagyl.    Past Surgical History:  Procedure Laterality Date   DILATATION AND CURETTAGE/HYSTEROSCOPY WITH MINERVA N/A 04/24/2022   Procedure: DILATATION AND CURETTAGE/HYSTEROSCOPY WITH MINERVA;  Surgeon: Lazaro Arms, MD;  Location: [facility];  Service: Gynecology;  Laterality: N/A;   ESSURE TUBAL LIGATION Bilateral    LAPAROSCOPIC BILATERAL  SALPINGECTOMY N/A 04/24/2022   Procedure: LAPAROSCOPIC BILATERAL SALPINGECTOMY; removal foreign object;  Surgeon: Lazaro Arms, MD;  Location: [facility];  Service: Gynecology;  Laterality: N/A;   TUBAL LIGATION      Family History  Problem Relation Age of Onset   Thyroid disease Mother    Hypertension Father        AVR   Heart murmur Father    Hypercholesterolemia Sister    Thyroid disease Sister    Diabetes type I Daughter    Thyroid disease Paternal Aunt    Thyroid disease Paternal Uncle    Hypertension Maternal Grandmother    Colon cancer Paternal Grandfather    Breast cancer Neg Hx    Cervical cancer Neg Hx     Social History   Socioeconomic History   Marital status: Single    Spouse name: Not on file   Number of children: 1   Years of education: Not on file   Highest education level: Not on file  Occupational History   Not on file  Tobacco Use   Smoking status: Former    Types: Cigarettes   Smokeless tobacco: Never  Vaping Use   Vaping Use: Never used  Substance and Sexual Activity   Alcohol use: Not Currently   Drug use: No   Sexual activity: Not Currently    Birth control/protection: Surgical    Comment: tubal ESSURE  Other Topics Concern   Not on file  Social History Narrative   Lives home alone   Social Determinants of Health   Financial Resource Strain: Low Risk  (08/08/2021)   Overall Financial Resource Strain (CARDIA)    Difficulty of Paying Living Expenses: Not very hard  Food Insecurity: No Food Insecurity (08/08/2021)   Hunger Vital Sign    Worried About Running Out of Food in the Last Year: Never true    Ran Out of Food in the Last Year: Never true  Transportation Needs: No Transportation Needs (08/08/2021)   PRAPARE - Administrator, Civil Service (Medical): No    Lack of Transportation (Non-Medical): No  Physical Activity: Sufficiently Active (08/08/2021)   Exercise Vital Sign    Days of Exercise per Week: 6 days    Minutes of  Exercise per Session: 30 min  Stress: No Stress Concern Present (08/08/2021)   [employer] of Occupational Health - Occupational Stress Questionnaire    Feeling of Stress : Only a little  Social Connections: Moderately Isolated (08/08/2021)   Social Connection and Isolation Panel [NHANES]    Frequency of Communication with Friends and Family: More than three times a week    Frequency of Social Gatherings with Friends and Family: Three times a week    Attends Religious Services: More than 4 times per year    Active Member of Clubs or Organizations: No    Attends Banker Meetings: Never    Marital Status: Divorced  Catering manager Violence: Not At Risk (08/08/2021)   Humiliation, Afraid, Rape,  and Kick questionnaire    Fear of Current or Ex-Partner: No    Emotionally Abused: No    Physically Abused: No    Sexually Abused: No    Review of Systems  Constitutional:  Negative for chills, diaphoresis, fever, malaise/fatigue and weight loss.  HENT:  Positive for ear discharge, hearing loss and tinnitus. Negative for congestion, nosebleeds and sinus pain.   Eyes:  Negative for pain, discharge and redness.  Respiratory:  Negative for cough, hemoptysis, sputum production and stridor.   Cardiovascular: Negative.  Negative for chest pain, palpitations, orthopnea, claudication and leg swelling.  Gastrointestinal:  Positive for abdominal pain. Negative for blood in stool, constipation, diarrhea, nausea and vomiting.  Genitourinary:  Negative for dysuria and urgency.  Neurological:  Positive for dizziness. Negative for tingling, tremors, sensory change, speech change, focal weakness, weakness and headaches.  Psychiatric/Behavioral:  Positive for depression. Negative for hallucinations, substance abuse and suicidal ideas. The patient is not nervous/anxious and does not have insomnia.         Objective    BP 138/82   Pulse 81   Ht 5\' 2"  (1.575 m)   Wt 164 lb (74.4 kg)   SpO2  98%   BMI 30.00 kg/m   Physical Exam Constitutional:      General: She is not in acute distress.    Appearance: She is obese. She is not ill-appearing or diaphoretic.  HENT:     Right Ear: Tympanic membrane, ear canal and external ear normal. There is no impacted cerumen.     Left Ear: Tympanic membrane, ear canal and external ear normal. There is no impacted cerumen.     Nose: Nose normal. No congestion or rhinorrhea.     Mouth/Throat:     Mouth: Mucous membranes are moist.     Pharynx: Oropharynx is clear. No oropharyngeal exudate or posterior oropharyngeal erythema.  Eyes:     General:        Right eye: No discharge.     Extraocular Movements: Extraocular movements intact.     Pupils: Pupils are equal, round, and reactive to light.  Cardiovascular:     Rate and Rhythm: Normal rate and regular rhythm.     Pulses: Normal pulses.     Heart sounds: Normal heart sounds. No murmur heard.    No friction rub.  Pulmonary:     Effort: Pulmonary effort is normal. No respiratory distress.     Breath sounds: Normal breath sounds. No stridor. No wheezing, rhonchi or rales.  Chest:     Chest wall: No tenderness.  Skin:    Capillary Refill: Capillary refill takes less than 2 seconds.  Neurological:     Mental Status: She is alert and oriented to person, place, and time.  Psychiatric:        Mood and Affect: Mood normal.        Behavior: Behavior normal.        Thought Content: Thought content normal.        Judgment: Judgment normal.         Assessment & Plan:   Problem List Items Addressed This Visit       Cardiovascular and Mediastinum   High blood pressure    BP Readings from Last 3 Encounters:  05/23/22 138/82  05/02/22 (!) 145/97  04/25/22 125/79  Not on medications DASH diet advised engage in regular moderate exercises daily Patient told to monitor blood pressure at home and call the office if her blood pressure  stays consistently greater than 140/90. Recent EKG  showed sinus rhythm        Nervous and Auditory   Loss of hearing    Intermittent chronic condition Patient referred to ENT ENT examination appears normal today,        Other   Menometrorrhagia      Underwent hysteroscopy uterine curettage  ablation laparoscopic bilateral salpingectomy a month ago.  She still has some mild abdominal tenderness since her surgery she takes midol as needed.      Insomnia    Currently on trazodone 50 mg daily Start trazodone 100 mg daily      Vertigo - Primary    Start Antivert 12.5 mg 3 times daily as needed Patient referred to ENT      Relevant Medications   meclizine (ANTIVERT) 12.5 MG tablet   Other Relevant Orders   Ambulatory referral to ENT   Depression, major, single episode, moderate (HCC)    PHQ-9 score 11 On trazodone 50 mg daily for insomnia Start trazodone 100 mg daily Follow-up in 6 weeks Denies SI, HI      Relevant Medications   traZODone (DESYREL) 100 MG tablet   Allergies    Continue Allegra 180 mg daily Avoid allergens      Obesity (BMI 30-39.9)    Wt Readings from Last 3 Encounters:  05/23/22 164 lb (74.4 kg)  05/02/22 168 lb (76.2 kg)  04/22/22 166 lb (75.3 kg)  Patient counseled on low-carb diet encouraged to engage in regular moderate exercises at least 150 minutes weekly Importance of portion control also discussed      Hypokalemia    Lab Results  Component Value Date   NA 136 04/24/2022   K 3.2 (L) 04/24/2022   CO2 21 (L) 04/24/2022   GLUCOSE 180 (H) 04/24/2022   BUN 8 04/24/2022   CREATININE 0.68 04/24/2022   CALCIUM 9.1 04/24/2022   GFRNONAA >60 04/24/2022  She is not on a diuretic denies nausea vomiting Check potassium level today      Relevant Orders   Potassium    Return in about 6 weeks (around 07/04/2022) for depression .   Donell Beers, FNP

## 2022-05-23 NOTE — Assessment & Plan Note (Signed)
Lab Results  Component Value Date   NA 136 04/24/2022   K 3.2 (L) 04/24/2022   CO2 21 (L) 04/24/2022   GLUCOSE 180 (H) 04/24/2022   BUN 8 04/24/2022   CREATININE 0.68 04/24/2022   CALCIUM 9.1 04/24/2022   GFRNONAA >60 04/24/2022  She is not on a diuretic denies nausea vomiting Check potassium level today

## 2022-05-23 NOTE — Telephone Encounter (Signed)
Alternative requested

## 2022-05-23 NOTE — Patient Instructions (Signed)
Please take antivert 12.5mg  three times daily as needed for vertigo .  Take trazodone 100 mg daily , this will help your depression and insomnia.      It is important that you exercise regularly at least 30 minutes 5 times a week.  Think about what you will eat, plan ahead. Choose " clean, green, fresh or frozen" over canned, processed or packaged foods which are more sugary, salty and fatty. 70 to 75% of food eaten should be vegetables and fruit. Three meals at set times with snacks allowed between meals, but they must be fruit or vegetables. Aim to eat over a 12 hour period , example 7 am to 7 pm, and STOP after  your last meal of the day. Drink water,generally about 64 ounces per day, no other drink is as healthy. Fruit juice is best enjoyed in a healthy way, by EATING the fruit.  Thanks for choosing [facility], we consider it a privelige to serve you.

## 2022-05-24 ENCOUNTER — Telehealth: Payer: Self-pay | Admitting: Nurse Practitioner

## 2022-05-24 LAB — POTASSIUM: Potassium: 4.1 mmol/L (ref 3.5–5.2)

## 2022-05-24 NOTE — Telephone Encounter (Signed)
States she needs the provider or nurse to call [phone] to get authorization for her omeprazole so she can get it filled today. [facility] if you want to do it on covermymeds

## 2022-05-24 NOTE — Progress Notes (Signed)
Normal potasium level

## 2022-05-29 ENCOUNTER — Other Ambulatory Visit: Payer: Self-pay | Admitting: Nurse Practitioner

## 2022-05-29 ENCOUNTER — Encounter: Payer: Self-pay | Admitting: Nurse Practitioner

## 2022-05-29 MED ORDER — PANTOPRAZOLE SODIUM 40 MG PO TBEC: 40.0000 mg | DELAYED_RELEASE_TABLET | Freq: Every day | ORAL | 3 refills | Status: DC

## 2022-05-30 ENCOUNTER — Other Ambulatory Visit: Payer: Self-pay | Admitting: Nurse Practitioner

## 2022-05-30 NOTE — Telephone Encounter (Signed)
Working on this

## 2022-05-31 NOTE — Telephone Encounter (Signed)
Faxed over info for prior auth

## 2022-06-01 ENCOUNTER — Other Ambulatory Visit: Payer: Self-pay | Admitting: Adult Health

## 2022-06-11 ENCOUNTER — Telehealth: Payer: Self-pay | Admitting: Nurse Practitioner

## 2022-06-11 NOTE — Telephone Encounter (Signed)
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 She did not go to the ER but she doesn't know what to do because she is supposed to work today but is scared. She wants to know what you think she should do. Pls advise

## 2022-06-11 NOTE — Telephone Encounter (Signed)
Please advise 

## 2022-06-11 NOTE — Telephone Encounter (Signed)
Spoke with pt advised of Fola's message pt states that it hit her all of a sudden and medication states as needed pt concerned please advise

## 2022-06-12 ENCOUNTER — Encounter: Payer: Self-pay | Admitting: Adult Health

## 2022-06-12 ENCOUNTER — Ambulatory Visit (INDEPENDENT_AMBULATORY_CARE_PROVIDER_SITE_OTHER): Payer: 59 | Admitting: Adult Health

## 2022-06-12 VITALS — BP 132/88 | HR 91 | Ht 62.0 in | Wt 165.5 lb

## 2022-06-12 DIAGNOSIS — R4589 Other symptoms and signs involving emotional state: Secondary | ICD-10-CM | POA: Diagnosis not present

## 2022-06-12 DIAGNOSIS — R232 Flushing: Secondary | ICD-10-CM | POA: Diagnosis not present

## 2022-06-12 DIAGNOSIS — Z3202 Encounter for pregnancy test, result negative: Secondary | ICD-10-CM | POA: Diagnosis not present

## 2022-06-12 DIAGNOSIS — R1032 Left lower quadrant pain: Secondary | ICD-10-CM | POA: Diagnosis not present

## 2022-06-12 DIAGNOSIS — G479 Sleep disorder, unspecified: Secondary | ICD-10-CM

## 2022-06-12 DIAGNOSIS — R69 Illness, unspecified: Secondary | ICD-10-CM | POA: Diagnosis not present

## 2022-06-12 LAB — POCT URINE PREGNANCY: Preg Test, Ur: NEGATIVE

## 2022-06-12 MED ORDER — SLYND 4 MG PO TABS: 1.0000 | ORAL_TABLET | Freq: Every day | ORAL | 0 refills | Status: DC

## 2022-06-12 NOTE — Telephone Encounter (Signed)
Spoke with pt she states that he is going to keep 8/17 appt goes to ent 8/15

## 2022-06-12 NOTE — Telephone Encounter (Signed)
Spoke with Kristina Delacruz she states that after she took the medicine she woke up with the worst headache the next day Kristina Delacruz states that she doesn't know if it came from her taking her trazodone that night as well or not. She stated that she felt really groggy. Please advise

## 2022-06-12 NOTE — Progress Notes (Signed)
  Subjective:     Patient ID: Kristina Delacruz, female   DOB: 25-Dec-1980, 41 y.o.   MRN: [id number]  HPI Artie is a 41 year old white female, single, G1P1 in wanting to get back on slynd, is having hot flashes, moody, not sleeping well and has pain LLQ. She had Hysteroscopy uterine curettage Minerva ablation laparoscopic bilateral  salpingectomy with removal of essure on 04/24/22.  She said Monday 06/10/22 she felt hot and fainted, was given Antivert by [facility].  Lab Results  Component Value Date   DIAGPAP  08/08/2021    - Negative for intraepithelial lesion or malignancy (NILM)   HPVHIGH Negative 08/08/2021   PCP is F Paseda NP  Review of Systems +hot flashes +moody Still not sleeping well +LLQ pain , had CT in June,? Air related to recent surgery   Reviewed past medical,surgical, social and family history. Reviewed medications and allergies.  Objective:   Physical Exam BP 132/88 (BP Location: Left Arm, Patient Position: Sitting, Cuff Size: Normal)   Pulse 91   Ht 5\' 2"  (1.575 m)   Wt 165 lb 8 oz (75.1 kg)   BMI 30.27 kg/m  UPT is negative Skin warm and dry.  Lungs: clear to ausculation bilaterally. Cardiovascular: regular rate and rhythm.   Has tiny stitch at navel incision, leave alone.  Upstream - 06/12/22 08/12/22       Pregnancy Intention Screening   Does the patient want to become pregnant in the next year? No    Does the patient's partner want to become pregnant in the next year? No    Would the patient like to discuss contraceptive options today? No      Contraception Wrap Up   Current Method Female Sterilization    End Method Female Sterilization              Assessment:     1. Pregnancy examination or test, negative result   2. Hot flashes Will try slynd again   3. Moody Will try slynd again, 3 packs given  4. Sleep disturbance She has trazodone   5. LLQ pain Will follow for now    Plan:     Follow up in 10 weeks for ROS or sooner if needed

## 2022-06-14 ENCOUNTER — Ambulatory Visit: Payer: 59 | Admitting: Obstetrics & Gynecology

## 2022-06-14 ENCOUNTER — Other Ambulatory Visit: Payer: Self-pay | Admitting: Nurse Practitioner

## 2022-06-14 DIAGNOSIS — F321 Major depressive disorder, single episode, moderate: Secondary | ICD-10-CM

## 2022-06-25 DIAGNOSIS — H6983 Other specified disorders of Eustachian tube, bilateral: Secondary | ICD-10-CM | POA: Diagnosis not present

## 2022-06-25 DIAGNOSIS — H814 Vertigo of central origin: Secondary | ICD-10-CM | POA: Diagnosis not present

## 2022-06-27 ENCOUNTER — Ambulatory Visit (INDEPENDENT_AMBULATORY_CARE_PROVIDER_SITE_OTHER): Payer: 59 | Admitting: Nurse Practitioner

## 2022-06-27 ENCOUNTER — Encounter: Payer: Self-pay | Admitting: Nurse Practitioner

## 2022-06-27 VITALS — BP 130/87 | HR 87 | Ht 62.0 in | Wt 163.0 lb

## 2022-06-27 DIAGNOSIS — R42 Dizziness and giddiness: Secondary | ICD-10-CM

## 2022-06-27 DIAGNOSIS — E669 Obesity, unspecified: Secondary | ICD-10-CM | POA: Diagnosis not present

## 2022-06-27 NOTE — Assessment & Plan Note (Addendum)
Has been evaluated by ENT specialist Has upcoming brain MRI, audiogram Patient encouraged to maintain close follow-up with ENT Encouraged to get up slowly from sitting position, avoid triggers of her vertigo On Antivert 12.5 mg 3 times daily as needed

## 2022-06-27 NOTE — Patient Instructions (Signed)

## 2022-06-27 NOTE — Assessment & Plan Note (Addendum)
Wt Readings from Last 3 Encounters:  06/27/22 163 lb (73.9 kg)  06/12/22 165 lb 8 oz (75.1 kg)  05/23/22 164 lb (74.4 kg)  Patient counseled on low-carb diet encouraged to engage in regular moderate exercises at least 150 minutes weekly

## 2022-06-27 NOTE — Progress Notes (Signed)
   Kristina Delacruz     MRN: [id number]      DOB: 1980/11/24   HPI Kristina Delacruz with past medical history of high blood pressure, vertigo, obesity is here for follow up for vertigo.  She has been examined by ENT, has upcoming MRI to rule out central causes of vertigo, audiogram to rule out sensory neural hearing loss associated with vertigo.  Still has some dizziness when she bend her head . Patient denies any new complaints today.        ROS Denies recent fever or chills. Denies sinus pressure, nasal congestion, ear pain or sore throat. Denies chest congestion, productive cough or wheezing. Denies chest pains, palpitations and leg swelling Denies abdominal pain, nausea, vomiting,diarrhea or constipation.   Denies dysuria, frequency, hesitancy or incontinence. Denies joint pain, swelling and limitation in mobility. Denies headaches, seizures, numbness, or tingling.    PE  BP 130/87 (BP Location: Left Arm, Patient Position: Sitting, Cuff Size: Normal)   Pulse 87   Ht 5\' 2"  (1.575 m)   Wt 163 lb (73.9 kg)   SpO2 97%   BMI 29.81 kg/m   Patient alert and oriented and in no cardiopulmonary distress.  HEENT: No facial asymmetry, EOMI,     Neck supple .  Chest: Clear to auscultation bilaterally.  CVS: S1, S2 no murmurs, no S3.Regular rate.  ABD: Soft non tender.   Ext: No edema  MS: Adequate ROM spine, shoulders, hips and knees.  Psych: Good eye contact, normal affect. Memory intact not anxious or depressed appearing.  CNS: CN 2-12 intact, power,  normal throughout.no focal deficits noted.   Assessment & Plan Obesity (BMI 30-39.9) Wt Readings from Last 3 Encounters:  06/27/22 163 lb (73.9 kg)  06/12/22 165 lb 8 oz (75.1 kg)  05/23/22 164 lb (74.4 kg)  Patient counseled on low-carb diet encouraged to engage in regular moderate exercises at least 150 minutes weekly  High blood pressure BP Readings from Last 3 Encounters:  06/27/22 130/87  06/12/22 132/88  05/23/22  138/82    Vertigo Has been evaluated by ENT specialist Has upcoming brain MRI, audiogram Patient encouraged to maintain close follow-up with ENT Encouraged to get up slowly from sitting position, avoid triggers of her vertigo On Antivert 12.5 mg 3 times daily as needed

## 2022-06-27 NOTE — Assessment & Plan Note (Signed)
BP Readings from Last 3 Encounters:  06/27/22 130/87  06/12/22 132/88  05/23/22 138/82

## 2022-06-28 ENCOUNTER — Other Ambulatory Visit: Payer: Self-pay | Admitting: Otolaryngology

## 2022-06-28 DIAGNOSIS — H814 Vertigo of central origin: Secondary | ICD-10-CM

## 2022-07-04 ENCOUNTER — Ambulatory Visit: Payer: 59 | Admitting: Nurse Practitioner

## 2022-07-13 ENCOUNTER — Ambulatory Visit
Admission: RE | Admit: 2022-07-13 | Discharge: 2022-07-13 | Disposition: A | Discharge status: Home / Self Care | Payer: 59 | Source: Ambulatory Visit | Attending: Otolaryngology | Admitting: Otolaryngology

## 2022-07-13 DIAGNOSIS — H814 Vertigo of central origin: Secondary | ICD-10-CM

## 2022-07-13 DIAGNOSIS — R42 Dizziness and giddiness: Secondary | ICD-10-CM | POA: Diagnosis not present

## 2022-07-13 MED ORDER — GADOPICLENOL 0.5 MMOL/ML IV SOLN
7.0000 mL | Freq: Once | INTRAVENOUS | Status: AC | PRN
  Administered 2022-07-13: 7 mL via INTRAVENOUS

## 2022-07-16 ENCOUNTER — Other Ambulatory Visit: Payer: Self-pay | Admitting: Nurse Practitioner

## 2022-07-16 DIAGNOSIS — F321 Major depressive disorder, single episode, moderate: Secondary | ICD-10-CM

## 2022-07-18 NOTE — Telephone Encounter (Signed)
Please advise 

## 2022-07-25 DIAGNOSIS — Q283 Other malformations of cerebral vessels: Secondary | ICD-10-CM | POA: Diagnosis not present

## 2022-08-21 ENCOUNTER — Ambulatory Visit (INDEPENDENT_AMBULATORY_CARE_PROVIDER_SITE_OTHER): Payer: 59 | Admitting: Adult Health

## 2022-08-21 ENCOUNTER — Encounter: Payer: Self-pay | Admitting: Adult Health

## 2022-08-21 VITALS — BP 120/79 | HR 82 | Ht 62.0 in | Wt 161.0 lb

## 2022-08-21 DIAGNOSIS — R232 Flushing: Secondary | ICD-10-CM | POA: Diagnosis not present

## 2022-08-21 DIAGNOSIS — R1032 Left lower quadrant pain: Secondary | ICD-10-CM

## 2022-08-21 MED ORDER — SLYND 4 MG PO TABS: 1.0000 | ORAL_TABLET | Freq: Every day | ORAL | 0 refills | Status: DC

## 2022-08-21 NOTE — Progress Notes (Signed)
  Subjective:     Patient ID: Kristina Delacruz, female   DOB: [birth year], 41 y.o.   MRN: [id number]  HPI Deane is a 41 year old while female, single, G1P1001, back in follow up on taking Slynd for hot flashes and much better. Still has some LLQ pain, comes and goes, but better.   Last pap was negative HPV and malignancy 08/08/21  PCP is [name] NP Review of Systems Hot flashes better LLQ pain comes and goes Reviewed past medical,surgical, social and family history. Reviewed medications and allergies.     Objective:   Physical Exam BP 120/79 (BP Location: Left Arm, Patient Position: Sitting, Cuff Size: Normal)   Pulse 82   Ht 5\' 2"  (1.575 m)   Wt 161 lb (73 kg)   BMI 29.45 kg/m     Skin warm and dry.  Lungs: clear to ausculation bilaterally. Cardiovascular: regular rate and rhythm.   Upstream - 08/21/22 4401       Pregnancy Intention Screening   Does the patient want to become pregnant in the next year? No    Does the patient's partner want to become pregnant in the next year? No    Would the patient like to discuss contraceptive options today? No      Contraception Wrap Up   Current Method Female Sterilization    End Method Female Sterilization    Contraception Counseling Provided No    How was the end contraceptive method provided? N/A             Assessment:    1. Hot flashes Much better, will continue Slynd 4 sample packs given Meds ordered this encounter  Medications   Drospirenone (SLYND) 4 MG TABS    Sig: Take 1 tablet by mouth daily.    Dispense:  112 tablet    Refill:  0    Order Specific Question:   Supervising Provider    Answer:   Elonda Husky, LUTHER H [[id number]]     2. LLQ pain Comes and goes, but better      Plan:     Follow up in 4 months or sooner if needed

## 2022-09-13 ENCOUNTER — Other Ambulatory Visit: Payer: Self-pay | Admitting: Nurse Practitioner

## 2022-10-07 ENCOUNTER — Encounter: Payer: Self-pay | Admitting: Family Medicine

## 2022-10-08 ENCOUNTER — Encounter: Payer: Self-pay | Admitting: Family Medicine

## 2022-10-08 ENCOUNTER — Ambulatory Visit (INDEPENDENT_AMBULATORY_CARE_PROVIDER_SITE_OTHER): Payer: 59 | Admitting: Family Medicine

## 2022-10-08 VITALS — BP 129/83 | HR 115 | Ht 62.0 in | Wt 161.0 lb

## 2022-10-08 DIAGNOSIS — J329 Chronic sinusitis, unspecified: Secondary | ICD-10-CM

## 2022-10-08 DIAGNOSIS — B9789 Other viral agents as the cause of diseases classified elsewhere: Secondary | ICD-10-CM | POA: Insufficient documentation

## 2022-10-08 DIAGNOSIS — U071 COVID-19: Secondary | ICD-10-CM

## 2022-10-08 MED ORDER — NOREL AD 4-10-325 MG PO TABS: ORAL_TABLET | ORAL | 0 refills | Status: DC

## 2022-10-08 NOTE — Progress Notes (Signed)
Acute Office Visit  Subjective:    Patient ID: Kristina Delacruz, female    DOB: May 06, 1981, 41 y.o.   MRN: [id number]  Chief Complaint  Patient presents with   Sinusitis    pt reports feeling head congestion on left side, swollen glands, sx started on 10/07/22, has been using allegra and mucinex, does report mild diarrhea yesterday.      HPI Patient is in today with complaints of head congestion, sinus pain and pressure, and a sinus headache.  She rates the headache 8 out of 10.  She denies nausea or vomiting, with one occurrence of diarrhea, which has subsided.  She denies fever and chills.  Onset of symptoms 10/07/2022, she has been using Allegra and Mucinex with minimal relief of her symptoms.  She reports sick contact a few weeks ago, noting that her children had an upper respiratory infection.  Past Medical History:  Diagnosis Date   GERD (gastroesophageal reflux disease)    Trichimoniasis 08/13/2021   08/13/21 treated with flagyl.    Past Surgical History:  Procedure Laterality Date   DILATATION AND CURETTAGE/HYSTEROSCOPY WITH MINERVA N/A 04/24/2022   Procedure: DILATATION AND CURETTAGE/HYSTEROSCOPY WITH MINERVA;  Surgeon: Lazaro Arms, MD;  Location: [facility];  Service: Gynecology;  Laterality: N/A;   ESSURE TUBAL LIGATION Bilateral    LAPAROSCOPIC BILATERAL SALPINGECTOMY N/A 04/24/2022   Procedure: LAPAROSCOPIC BILATERAL SALPINGECTOMY; removal foreign object;  Surgeon: Lazaro Arms, MD;  Location: [facility];  Service: Gynecology;  Laterality: N/A;   TUBAL LIGATION      Family History  Problem Relation Age of Onset   Thyroid disease Mother    Hypertension Father        AVR   Heart murmur Father    Hypercholesterolemia Sister    Thyroid disease Sister    Diabetes type I Daughter    Thyroid disease Paternal Aunt    Thyroid disease Paternal Uncle    Hypertension Maternal Grandmother    Colon cancer Paternal Grandfather    Breast cancer Neg Hx    Cervical cancer Neg Hx      Social History   Socioeconomic History   Marital status: Single    Spouse name: Not on file   Number of children: 1   Years of education: Not on file   Highest education level: Not on file  Occupational History   Not on file  Tobacco Use   Smoking status: Former    Types: Cigarettes   Smokeless tobacco: Never  Vaping Use   Vaping Use: Never used  Substance and Sexual Activity   Alcohol use: Not Currently   Drug use: No   Sexual activity: Not Currently    Birth control/protection: Surgical    Comment: tubal & ablation  Other Topics Concern   Not on file  Social History Narrative   Lives home alone   Social Determinants of Health   Financial Resource Strain: Low Risk  (08/08/2021)   Overall Financial Resource Strain (CARDIA)    Difficulty of Paying Living Expenses: Not very hard  Food Insecurity: No Food Insecurity (08/08/2021)   Hunger Vital Sign    Worried About Running Out of Food in the Last Year: Never true    Ran Out of Food in the Last Year: Never true  Transportation Needs: No Transportation Needs (08/08/2021)   PRAPARE - Administrator, Civil Service (Medical): No    Lack of Transportation (Non-Medical): No  Physical Activity: Sufficiently Active (08/08/2021)  Exercise Vital Sign    Days of Exercise per Week: 6 days    Minutes of Exercise per Session: 30 min  Stress: No Stress Concern Present (08/08/2021)   [employer] of Occupational Health - Occupational Stress Questionnaire    Feeling of Stress : Only a little  Social Connections: Moderately Isolated (08/08/2021)   Social Connection and Isolation Panel [NHANES]    Frequency of Communication with Friends and Family: More than three times a week    Frequency of Social Gatherings with Friends and Family: Three times a week    Attends Religious Services: More than 4 times per year    Active Member of Clubs or Organizations: No    Attends Banker Meetings: Never    Marital  Status: Divorced  Catering manager Violence: Not At Risk (08/08/2021)   Humiliation, Afraid, Rape, and Kick questionnaire    Fear of Current or Ex-Partner: No    Emotionally Abused: No    Physically Abused: No    Sexually Abused: No    Outpatient Medications Prior to Visit  Medication Sig Dispense Refill   Drospirenone (SLYND) 4 MG TABS Take 1 tablet by mouth daily. 112 tablet 0   fexofenadine (ALLEGRA) 180 MG tablet Take 180 mg by mouth in the morning.     naproxen sodium (ALEVE) 220 MG tablet Take 220 mg by mouth daily as needed (pain.).     omeprazole (PRILOSEC) 40 MG capsule      pantoprazole (PROTONIX) 40 MG tablet TAKE 1 TABLET BY MOUTH EVERY DAY 30 tablet 3   traZODone (DESYREL) 100 MG tablet Take 1 tablet (100 mg total) by mouth at bedtime. 90 tablet 1   meclizine (ANTIVERT) 12.5 MG tablet Take 1 tablet (12.5 mg total) by mouth 3 (three) times daily as needed for dizziness. (Patient not taking: Reported on 06/27/2022) 30 tablet 0   Acetaminophen-Caff-Pyrilamine (MIDOL COMPLETE) 500-60-15 MG TABS Take 1 tablet by mouth in the morning. (Patient not taking: Reported on 10/08/2022)     No facility-administered medications prior to visit.    Allergies  Allergen Reactions   Bee Venom Swelling   Onion Rash    Review of Systems  Constitutional:  Negative for chills and fatigue.  HENT:  Positive for congestion, sinus pressure and sinus pain.   Respiratory:  Negative for cough, chest tightness and shortness of breath.   Cardiovascular:  Negative for chest pain and palpitations.  Neurological:  Negative for dizziness and headaches.       Objective:    Physical Exam HENT:     Head: Normocephalic.     Right Ear: External ear normal.     Left Ear: External ear normal.     Nose:     Left Sinus: Maxillary sinus tenderness present.  Cardiovascular:     Rate and Rhythm: Normal rate and regular rhythm.     Pulses: Normal pulses.     Heart sounds: Normal heart sounds.   Neurological:     Mental Status: She is alert.     BP 129/83   Pulse (!) 115   Ht 5\' 2"  (1.575 m)   Wt 161 lb 0.6 oz (73 kg)   SpO2 97%   BMI 29.45 kg/m  Wt Readings from Last 3 Encounters:  10/08/22 161 lb 0.6 oz (73 kg)  08/21/22 161 lb (73 kg)  06/27/22 163 lb (73.9 kg)       Assessment & Plan:  Viral sinusitis Assessment & Plan: We will treat  with Norel AD Patient reports that she cannot tolerate nasal spray Encourage patient not to take tylenol or allergy medication when taking Norel AD Instruct patient to not take more than 6 tablets in 24 hours Instructed patient to take Norel AD every 4 hours while symptoms persist We will test the patient today for COVID and flu   Orders: -     Norel AD; Take 1 tablet every 4 hours, while symptoms persist. Do jno take more than 6 tablets in 24hrs  Dispense: 20 tablet; Refill: 0  COVID-19 -     Coronavirus (COVID-19) with Influenza A and Influenza B    Gilmore Laroche, FNP

## 2022-10-08 NOTE — Progress Notes (Deleted)
Acute Office Visit  Subjective:    Patient ID: Kristina Delacruz, female    DOB: 10/10/1981, 41 y.o.   MRN: [id number]  Chief Complaint  Patient presents with   Sinusitis    pt reports feeling head congestion on left side, swollen glands, sx started on 10/07/22, has been using allegra and mucinex, does report mild diarrhea yesterday.      Sinusitis Associated symptoms include congestion, coughing (dry cough), headaches, sinus pressure, sneezing and a sore throat. Pertinent negatives include no shortness of breath.   Patient is in today for ***  The kids had cold viris Stomach bug saturadya night, head hurts, nose dring badly, Past Medical History:  Diagnosis Date   GERD (gastroesophageal reflux disease)    Trichimoniasis 08/13/2021   08/13/21 treated with flagyl.    Past Surgical History:  Procedure Laterality Date   DILATATION AND CURETTAGE/HYSTEROSCOPY WITH MINERVA N/A 04/24/2022   Procedure: DILATATION AND CURETTAGE/HYSTEROSCOPY WITH MINERVA;  Surgeon: Lazaro Arms, MD;  Location: [facility];  Service: Gynecology;  Laterality: N/A;   ESSURE TUBAL LIGATION Bilateral    LAPAROSCOPIC BILATERAL SALPINGECTOMY N/A 04/24/2022   Procedure: LAPAROSCOPIC BILATERAL SALPINGECTOMY; removal foreign object;  Surgeon: Lazaro Arms, MD;  Location: [facility];  Service: Gynecology;  Laterality: N/A;   TUBAL LIGATION      Family History  Problem Relation Age of Onset   Thyroid disease Mother    Hypertension Father        AVR   Heart murmur Father    Hypercholesterolemia Sister    Thyroid disease Sister    Diabetes type I Daughter    Thyroid disease Paternal Aunt    Thyroid disease Paternal Uncle    Hypertension Maternal Grandmother    Colon cancer Paternal Grandfather    Breast cancer Neg Hx    Cervical cancer Neg Hx     Social History   Socioeconomic History   Marital status: Single    Spouse name: Not on file   Number of children: 1   Years of education: Not on file   Highest  education level: Not on file  Occupational History   Not on file  Tobacco Use   Smoking status: Former    Types: Cigarettes   Smokeless tobacco: Never  Vaping Use   Vaping Use: Never used  Substance and Sexual Activity   Alcohol use: Not Currently   Drug use: No   Sexual activity: Not Currently    Birth control/protection: Surgical    Comment: tubal & ablation  Other Topics Concern   Not on file  Social History Narrative   Lives home alone   Social Determinants of Health   Financial Resource Strain: Low Risk  (08/08/2021)   Overall Financial Resource Strain (CARDIA)    Difficulty of Paying Living Expenses: Not very hard  Food Insecurity: No Food Insecurity (08/08/2021)   Hunger Vital Sign    Worried About Running Out of Food in the Last Year: Never true    Ran Out of Food in the Last Year: Never true  Transportation Needs: No Transportation Needs (08/08/2021)   PRAPARE - Administrator, Civil Service (Medical): No    Lack of Transportation (Non-Medical): No  Physical Activity: Sufficiently Active (08/08/2021)   Exercise Vital Sign    Days of Exercise per Week: 6 days    Minutes of Exercise per Session: 30 min  Stress: No Stress Concern Present (08/08/2021)   [employer] of Occupational Health -  Occupational Stress Questionnaire    Feeling of Stress : Only a little  Social Connections: Moderately Isolated (08/08/2021)   Social Connection and Isolation Panel [NHANES]    Frequency of Communication with Friends and Family: More than three times a week    Frequency of Social Gatherings with Friends and Family: Three times a week    Attends Religious Services: More than 4 times per year    Active Member of Clubs or Organizations: No    Attends Banker Meetings: Never    Marital Status: Divorced  Catering manager Violence: Not At Risk (08/08/2021)   Humiliation, Afraid, Rape, and Kick questionnaire    Fear of Current or Ex-Partner: No    Emotionally  Abused: No    Physically Abused: No    Sexually Abused: No    Outpatient Medications Prior to Visit  Medication Sig Dispense Refill   Drospirenone (SLYND) 4 MG TABS Take 1 tablet by mouth daily. 112 tablet 0   fexofenadine (ALLEGRA) 180 MG tablet Take 180 mg by mouth in the morning.     naproxen sodium (ALEVE) 220 MG tablet Take 220 mg by mouth daily as needed (pain.).     omeprazole (PRILOSEC) 40 MG capsule      pantoprazole (PROTONIX) 40 MG tablet TAKE 1 TABLET BY MOUTH EVERY DAY 30 tablet 3   traZODone (DESYREL) 100 MG tablet Take 1 tablet (100 mg total) by mouth at bedtime. 90 tablet 1   Acetaminophen-Caff-Pyrilamine (MIDOL COMPLETE) 500-60-15 MG TABS Take 1 tablet by mouth in the morning. (Patient not taking: Reported on 10/08/2022)     meclizine (ANTIVERT) 12.5 MG tablet Take 1 tablet (12.5 mg total) by mouth 3 (three) times daily as needed for dizziness. (Patient not taking: Reported on 06/27/2022) 30 tablet 0   No facility-administered medications prior to visit.    Allergies  Allergen Reactions   Bee Venom Swelling   Onion Rash    Review of Systems  Constitutional:  Negative for fever.  HENT:  Positive for congestion, rhinorrhea, sinus pressure, sinus pain, sneezing and sore throat.   Respiratory:  Positive for cough (dry cough). Negative for chest tightness and shortness of breath.   Cardiovascular:  Negative for chest pain and palpitations.  Neurological:  Positive for headaches. Negative for dizziness.       Objective:    Physical Exam  BP 129/83   Pulse (!) 115   Ht 5\' 2"  (1.575 m)   Wt 161 lb 0.6 oz (73 kg)   SpO2 97%   BMI 29.45 kg/m  Wt Readings from Last 3 Encounters:  10/08/22 161 lb 0.6 oz (73 kg)  08/21/22 161 lb (73 kg)  06/27/22 163 lb (73.9 kg)       Assessment & Plan:  There are no diagnoses linked to this encounter.  06/29/22, FNP

## 2022-10-08 NOTE — Assessment & Plan Note (Signed)
We will treat with Norel AD Patient reports that she cannot tolerate nasal spray Encourage patient not to take tylenol or allergy medication when taking Norel AD Instruct patient to not take more than 6 tablets in 24 hours Instructed patient to take Norel AD every 4 hours while symptoms persist We will test the patient today for COVID and flu

## 2022-10-08 NOTE — Patient Instructions (Addendum)
I appreciate the opportunity to provide care to you today!    Follow up:  12/27/2022  Please start taking Norel AD for your cold and  sinus symptoms Take 1 tablet every 4 hours while symptoms persist Do not take more than 6 tablets in 24 hours I recommend warm salt gargles for sore throat Do not take Tylenol and Allegra while taking the Norel AD You will be contacted with the results of your COVID and flu examination today   Please continue to a heart-healthy diet and increase your physical activities. Try to exercise for at least three times a week.      It was a pleasure to see you and I look forward to continuing to work together on your health and well-being. Please do not hesitate to call the office if you need care or have questions about your care.   Have a wonderful day and week. With Gratitude, Gilmore Laroche MSN, FNP-BC

## 2022-10-09 ENCOUNTER — Telehealth: Payer: Self-pay | Admitting: Family Medicine

## 2022-10-09 NOTE — Telephone Encounter (Signed)
The patient is informed to  report to the ED if you have a generalized rash with swelling of your throat and tongue, difficulty breathing or breathing very fast, difficulty swallowing, tightness in your throat or hoarse voice, wheezing, coughing, noisy breathing, or feeling tired or confused.  The patient is encouraged to schedule a televisit tomorrow if the rash does not subside.  I recommend taking over-the-counter Zyrtec 5 mg

## 2022-10-09 NOTE — Telephone Encounter (Signed)
Pt reports taking two doses yesterday and when she got in the shower she had a breakout, applied steroid cream, and took another dose today, states she is still breakin out, please advice?

## 2022-10-09 NOTE — Telephone Encounter (Signed)
Pt called stating that she is having an allergic reaction/rash to the abx she was given yesterday. Can you please give her a call when available?

## 2022-10-09 NOTE — Telephone Encounter (Signed)
Tried giving pt a return call [id number].

## 2022-10-10 ENCOUNTER — Ambulatory Visit (INDEPENDENT_AMBULATORY_CARE_PROVIDER_SITE_OTHER): Payer: 59 | Admitting: Family Medicine

## 2022-10-10 ENCOUNTER — Encounter: Payer: Self-pay | Admitting: Family Medicine

## 2022-10-10 DIAGNOSIS — R21 Rash and other nonspecific skin eruption: Secondary | ICD-10-CM

## 2022-10-10 DIAGNOSIS — U071 COVID-19: Secondary | ICD-10-CM | POA: Diagnosis not present

## 2022-10-10 LAB — COVID-19, FLU A+B NAA
Influenza A, NAA: NOT DETECTED
Influenza B, NAA: NOT DETECTED
SARS-CoV-2, NAA: DETECTED — AB

## 2022-10-10 MED ORDER — TRIAMCINOLONE ACETONIDE 0.1 % EX CREA: 1.0000 | TOPICAL_CREAM | Freq: Two times a day (BID) | CUTANEOUS | 0 refills | Status: DC

## 2022-10-10 MED ORDER — NIRMATRELVIR/RITONAVIR (PAXLOVID)TABLET: 3.0000 | ORAL_TABLET | Freq: Two times a day (BID) | ORAL | 0 refills | Status: AC

## 2022-10-10 NOTE — Progress Notes (Signed)
Virtual Visit via Telephone Note   This visit type was conducted via telephone. This format is felt to be most appropriate for this patient at this time.  The patient did not have access to video technology/had technical difficulties with video requiring transitioning to audio format only (telephone).  All issues noted in this document were discussed and addressed.  No physical exam could be performed with this format.  Evaluation Performed:  Follow-up visit  Date:  10/10/2022   ID:  Kristina Delacruz, Kristina Delacruz 26-Mar-1981, MRN [id number]  Patient Location: Home Provider Location: Clinic  Participants: Patient Location of Patient: Home Location of Provider: Clinic Consent was obtain for visit to be over via telehealth. I verified that I am speaking with the correct person using two identifiers.  PCP:  Gilmore Laroche, FNP   Chief Complaint:  Covid 19  History of Present Illness:    Kristina Delacruz is a 41 y.o. female with positive COVID test.  Onset of symptoms 10/07/2022.  She reports worsening of her symptoms today, noting increased sweating, chills, and headaches  The patient does have symptoms concerning for COVID-19 infection (fever, chills, cough, or new shortness of breath).   Past Medical, Surgical, Social History, Allergies, and Medications have been Reviewed.  Past Medical History:  Diagnosis Date   GERD (gastroesophageal reflux disease)    Trichimoniasis 08/13/2021   08/13/21 treated with flagyl.   Past Surgical History:  Procedure Laterality Date   DILATATION AND CURETTAGE/HYSTEROSCOPY WITH MINERVA N/A 04/24/2022   Procedure: DILATATION AND CURETTAGE/HYSTEROSCOPY WITH MINERVA;  Surgeon: Lazaro Arms, MD;  Location: [facility];  Service: Gynecology;  Laterality: N/A;   ESSURE TUBAL LIGATION Bilateral    LAPAROSCOPIC BILATERAL SALPINGECTOMY N/A 04/24/2022   Procedure: LAPAROSCOPIC BILATERAL SALPINGECTOMY; removal foreign object;  Surgeon: Lazaro Arms, MD;  Location: [facility];  Service: Gynecology;  Laterality: N/A;   TUBAL LIGATION       Current Meds  Medication Sig   Chlorphen-PE-Acetaminophen (NOREL AD) 4-10-325 MG TABS Take 1 tablet every 4 hours, while symptoms persist. Do jno take more than 6 tablets in 24hrs   Drospirenone (SLYND) 4 MG TABS Take 1 tablet by mouth daily.   fexofenadine (ALLEGRA) 180 MG tablet Take 180 mg by mouth in the morning.   meclizine (ANTIVERT) 12.5 MG tablet Take 1 tablet (12.5 mg total) by mouth 3 (three) times daily as needed for dizziness.   naproxen sodium (ALEVE) 220 MG tablet Take 220 mg by mouth daily as needed (pain.).   nirmatrelvir/ritonavir EUA (PAXLOVID) 20 x 150 MG & 10 x 100MG  TABS Take 3 tablets by mouth 2 (two) times daily for 5 days. (Take nirmatrelvir 150 mg two tablets twice daily for 5 days and ritonavir 100 mg one tablet twice daily for 5 days) Patient GFR is 60   omeprazole (PRILOSEC) 40 MG capsule    pantoprazole (PROTONIX) 40 MG tablet TAKE 1 TABLET BY MOUTH EVERY DAY   traZODone (DESYREL) 100 MG tablet Take 1 tablet (100 mg total) by mouth at bedtime.   triamcinolone cream (KENALOG) 0.1 % Apply 1 Application topically 2 (two) times daily.     Allergies:   Bee venom and Onion   ROS:   Please see the history of present illness.     All other systems reviewed and are negative.   Labs/Other Tests and Data Reviewed:    Recent Labs: 04/22/2022: ALT 13 04/24/2022: BUN 8; Creatinine, Ser 0.68; Hemoglobin 12.7; Platelets 287; Sodium 136  05/23/2022: Potassium 4.1   Recent Lipid Panel No results found for: "CHOL", "TRIG", "HDL", "CHOLHDL", "LDLCALC", "LDLDIRECT"  Wt Readings from Last 3 Encounters:  10/08/22 161 lb 0.6 oz (73 kg)  08/21/22 161 lb (73 kg)  06/27/22 163 lb (73.9 kg)     Objective:    Vital Signs:  There were no vitals taken for this visit.     ASSESSMENT & PLAN:   Positive COVID-19 We will treat patient with Paxlovid for 5 days given that she has good renal function Patient is  taking slynd, which only contains progesterone, for early menopausal symptoms.  The medication does not have any estrogen Inform the patient that taking paxlovid with Slynd decreases the medication's effectiveness She reports a history of bilateral tubal ligation Encouraged to stay well-hydrated She complains of a generalized rash while taking Norel AD Encouraged the patient to stop treatment Prescription sent for Kenalog cream to alleviate symptoms of pruritus  Time:   Today, I have spent 15 minutes reviewing the chart, including problem list, medications, and with the patient with telehealth technology discussing the above problems.   Medication Adjustments/Labs and Tests Ordered: Current medicines are reviewed at length with the patient today.  Concerns regarding medicines are outlined above.   Tests Ordered: No orders of the defined types were placed in this encounter.   Medication Changes: Meds ordered this encounter  Medications   nirmatrelvir/ritonavir EUA (PAXLOVID) 20 x 150 MG & 10 x 100MG  TABS    Sig: Take 3 tablets by mouth 2 (two) times daily for 5 days. (Take nirmatrelvir 150 mg two tablets twice daily for 5 days and ritonavir 100 mg one tablet twice daily for 5 days) Patient GFR is 60    Dispense:  30 tablet    Refill:  0   triamcinolone cream (KENALOG) 0.1 %    Sig: Apply 1 Application topically 2 (two) times daily.    Dispense:  15 g    Refill:  0     Note: This dictation was prepared with Dragon dictation along with smaller phrase technology. Similar sounding words can be transcribed inadequately or may not be corrected upon review. Any transcriptional errors that result from this process are unintentional.      Disposition:  Follow up  Signed, , FNP  10/10/2022 8:36 AM     10/12/2022 [facility]

## 2022-11-01 ENCOUNTER — Other Ambulatory Visit: Payer: Self-pay | Admitting: Nurse Practitioner

## 2022-11-01 DIAGNOSIS — F321 Major depressive disorder, single episode, moderate: Secondary | ICD-10-CM

## 2022-11-05 NOTE — Telephone Encounter (Signed)
The patient has 5 refills on his medication

## 2022-11-07 ENCOUNTER — Ambulatory Visit: Payer: 59 | Admitting: Family Medicine

## 2022-11-11 DIAGNOSIS — Z419 Encounter for procedure for purposes other than remedying health state, unspecified: Secondary | ICD-10-CM | POA: Diagnosis not present

## 2022-11-12 ENCOUNTER — Ambulatory Visit (INDEPENDENT_AMBULATORY_CARE_PROVIDER_SITE_OTHER): Payer: 59 | Admitting: Internal Medicine

## 2022-11-12 ENCOUNTER — Encounter: Payer: Self-pay | Admitting: Internal Medicine

## 2022-11-12 VITALS — BP 138/87 | HR 103 | Ht 62.0 in | Wt 164.0 lb

## 2022-11-12 DIAGNOSIS — I700 Atherosclerosis of aorta: Secondary | ICD-10-CM

## 2022-11-12 DIAGNOSIS — G4700 Insomnia, unspecified: Secondary | ICD-10-CM | POA: Diagnosis not present

## 2022-11-12 DIAGNOSIS — E6609 Other obesity due to excess calories: Secondary | ICD-10-CM | POA: Insufficient documentation

## 2022-11-12 DIAGNOSIS — E66811 Obesity, class 1: Secondary | ICD-10-CM | POA: Insufficient documentation

## 2022-11-12 DIAGNOSIS — F321 Major depressive disorder, single episode, moderate: Secondary | ICD-10-CM | POA: Diagnosis not present

## 2022-11-12 DIAGNOSIS — R69 Illness, unspecified: Secondary | ICD-10-CM | POA: Diagnosis not present

## 2022-11-12 DIAGNOSIS — Z6830 Body mass index (BMI) 30.0-30.9, adult: Secondary | ICD-10-CM

## 2022-11-12 DIAGNOSIS — K219 Gastro-esophageal reflux disease without esophagitis: Secondary | ICD-10-CM

## 2022-11-12 DIAGNOSIS — R55 Syncope and collapse: Secondary | ICD-10-CM | POA: Insufficient documentation

## 2022-11-12 MED ORDER — TRAZODONE HCL 150 MG PO TABS: 150.0000 mg | ORAL_TABLET | Freq: Every day | ORAL | 1 refills | Status: DC

## 2022-11-12 NOTE — Assessment & Plan Note (Signed)
Patient has insomnia and relates this to when menopause started. She has been treated with Trazadone, currently taking 100 mg daily. This medication has helped but feels it is not working as well. She is also on Drospirenone for menopause .  Assessment/Plan: Chronic problem, uncontrolled -Increase Trazodone to 150 mg qhs

## 2022-11-12 NOTE — Assessment & Plan Note (Addendum)
Patient estimates 6 episodes of presyncope over 1 year. She feels episodes of palpitations and almost faints. She has had 2 episodes have been in the month of December.  She was seen by neurology at [facility] for these episodes and history of vertigo.  MRI without cause of central vertigo. Not associated with going from sitting to standing. Reports history of aortic valve disease in father and daughter with heart murmur.   Assessment/Plan: Near Syncope, unknown etiology. Will refer to cardiology for consideration of ambulatory cardiac monitoring.

## 2022-11-12 NOTE — Patient Instructions (Addendum)
Thank you, Ms.Kristina Delacruz for allowing [place] to provide your care today. Today we discussed heart burn, insomnia, and episodes of near syncope.    I have ordered the following labs for you:  Lab Orders         Basic metabolic panel         Hemoglobin A1C         Magnesium      Tests ordered today:  none  Referrals ordered today:   Referral Orders         Ambulatory referral to Cardiology       I have ordered the following medication/changed the following medications:   Stop the following medications: Medications Discontinued During This Encounter  Medication Reason   traZODone (DESYREL) 100 MG tablet Reorder     Start the following medications: Meds ordered this encounter  Medications   traZODone (DESYREL) 150 MG tablet    Sig: Take 1 tablet (150 mg total) by mouth at bedtime.    Dispense:  90 tablet    Refill:  1     Follow up: 2 months      Tamsen Snider, M.D.

## 2022-11-12 NOTE — Progress Notes (Signed)
      HPI:Kristina Delacruz is a 42 y.o. female who presents for insomnia, episode of presyncope, and GERD . For the details of today's visit, please refer to the assessment and plan.   Physical Exam: Vitals:   11/12/22 0812  BP: 138/87  Pulse: (!) 103  SpO2: 97%  Weight: 164 lb 0.6 oz (74.4 kg)  Height: 5\' 2"  (1.575 m)     Physical Exam Constitutional:      General: She is not in acute distress.    Appearance: She is not ill-appearing.  Neck:     Vascular: No carotid bruit.  Cardiovascular:     Rate and Rhythm: Regular rhythm. Tachycardia present.  Pulmonary:     Effort: Pulmonary effort is normal.     Breath sounds: Normal breath sounds. No wheezing, rhonchi or rales.  Musculoskeletal:     Right lower leg: No edema.     Left lower leg: No edema.      Assessment & Plan:   Near syncope Patient estimates 6 episodes of presyncope over 1 year. She feels episodes of palpitations and almost faints. She has had 2 episodes have been in the month of December.  She was seen by neurology at [facility] for these episodes and history of vertigo.  MRI without cause of central vertigo. Not associated with going from sitting to standing. Reports history of aortic valve disease in father and daughter with heart murmur.   Assessment/Plan: Near Syncope, unknown etiology. Will refer to cardiology for consideration of ambulatory cardiac monitoring.   GERD (gastroesophageal reflux disease) Request refill of Protonix.  Refill already sent by PCP.    Assessment/Plan:  Chronic problem, controlled on PPI. Long term medication and low potassium previously on BMP. Will check Magnesium.   Insomnia Patient has insomnia and relates this to when menopause started. She has been treated with Trazadone, currently taking 100 mg daily. This medication has helped but feels it is not working as well. She is also on Drospirenone for menopause .  Assessment/Plan: Chronic problem,  uncontrolled -Increase Trazodone to 150 mg qhs   Class 1 obesity due to excess calories without serious comorbidity with body mass index (BMI) of 30.0 to 30.9 in adult Check lipid panel and hemoglobin A1c.   Kristina Dy, MD

## 2022-11-12 NOTE — Assessment & Plan Note (Signed)
Check lipid panel and hemoglobin A1c.

## 2022-11-12 NOTE — Assessment & Plan Note (Signed)
Request refill of Protonix.  Refill already sent by PCP.    Assessment/Plan:  Chronic problem, controlled on PPI. Long term medication and low potassium previously on BMP. Will check Magnesium.

## 2022-11-13 LAB — HEMOGLOBIN A1C
Est. average glucose Bld gHb Est-mCnc: 105 mg/dL
Hgb A1c MFr Bld: 5.3 % (ref 4.8–5.6)

## 2022-11-13 LAB — BASIC METABOLIC PANEL
BUN/Creatinine Ratio: 14 (ref 9–23)
BUN: 13 mg/dL (ref 6–24)
CO2: 21 mmol/L (ref 20–29)
Calcium: 9.8 mg/dL (ref 8.7–10.2)
Chloride: 104 mmol/L (ref 96–106)
Creatinine, Ser: 0.95 mg/dL (ref 0.57–1.00)
Glucose: 61 mg/dL — ABNORMAL LOW (ref 70–99)
Potassium: 3.6 mmol/L (ref 3.5–5.2)
Sodium: 140 mmol/L (ref 134–144)
eGFR: 77 mL/min/{1.73_m2} (ref >59)

## 2022-11-13 LAB — MAGNESIUM: Magnesium: 1.9 mg/dL (ref 1.6–2.3)

## 2022-11-15 LAB — LIPID PANEL
Chol/HDL Ratio: 3.9 ratio (ref 0.0–4.4)
Cholesterol, Total: 194 mg/dL (ref 100–199)
HDL: 50 mg/dL (ref >39)
LDL Chol Calc (NIH): 127 mg/dL — ABNORMAL HIGH (ref 0–99)
Triglycerides: 96 mg/dL (ref 0–149)
VLDL Cholesterol Cal: 17 mg/dL (ref 5–40)

## 2022-11-15 LAB — SPECIMEN STATUS REPORT

## 2022-12-12 DIAGNOSIS — Z419 Encounter for procedure for purposes other than remedying health state, unspecified: Secondary | ICD-10-CM | POA: Diagnosis not present

## 2022-12-20 ENCOUNTER — Encounter: Payer: Self-pay | Admitting: Internal Medicine

## 2022-12-20 ENCOUNTER — Ambulatory Visit: Payer: 59 | Attending: Internal Medicine | Admitting: Internal Medicine

## 2022-12-20 ENCOUNTER — Ambulatory Visit (INDEPENDENT_AMBULATORY_CARE_PROVIDER_SITE_OTHER): Payer: 59

## 2022-12-20 VITALS — BP 134/82 | HR 80 | Ht 62.0 in | Wt 165.6 lb

## 2022-12-20 DIAGNOSIS — R55 Syncope and collapse: Secondary | ICD-10-CM

## 2022-12-20 NOTE — Patient Instructions (Signed)
Medication Instructions:  Your physician recommends that you continue on your current medications as directed. Please refer to the Current Medication list given to you today.   Labwork: None  Testing/Procedures: Your physician has requested that you have an echocardiogram. Echocardiography is a painless test that uses sound waves to create images of your heart. It provides your doctor with information about the size and shape of your heart and how well your heart's chambers and valves are working. This procedure takes approximately one hour. There are no restrictions for this procedure. Please do NOT wear cologne, perfume, aftershave, or lotions (deodorant is allowed). Please arrive 15 minutes prior to your appointment time.  Your physician has requested that you have an exercise tolerance test. For further information please visit [email or web address]. Please also follow instruction sheet, as given.    Follow-Up: Follow up with Dr. Dellia Cloud in 3 months.    Any Other Special Instructions Will Be Listed Below (If Applicable).     If you need a refill on your cardiac medications before your next appointment, please call your pharmacy.   ZIO XT- Long Term Monitor Instructions   Your physician has requested you wear your ZIO patch monitor___14____days.   This is a single patch monitor.  [employer] supplies one patch monitor per enrollment.  Additional stickers are not available.   Please do not apply patch if you will be having a Nuclear Stress Test, Echocardiogram, Cardiac CT, MRI, or Chest Xray during the time frame you would be wearing the monitor. The patch cannot be worn during these tests.  You cannot remove and re-apply the ZIO XT patch monitor.   Your ZIO patch monitor will be sent USPS Priority mail from [facility] directly to your home address. The monitor may also be mailed to a PO BOX if home delivery is not available.   It may take 3-5 days to receive your monitor  after you have been enrolled.   Once you have received you monitor, please review enclosed instructions.  Your monitor has already been registered assigning a specific monitor serial # to you.   Applying the monitor   Shave hair from upper left chest.   Hold abrader disc by orange tab.  Rub abrader in 40 strokes over left upper chest as indicated in your monitor instructions.   Clean area with 4 enclosed alcohol pads .  Use all pads to assure are is cleaned thoroughly.  Let dry.   Apply patch as indicated in monitor instructions.  Patch will be place under collarbone on left side of chest with arrow pointing upward.   Rub patch adhesive wings for 2 minutes.Remove white label marked "1".  Remove white label marked "2".  Rub patch adhesive wings for 2 additional minutes.   While looking in a mirror, press and release button in center of patch.  A small green light will flash 3-4 times .  This will be your only indicator the monitor has been turned on.     Do not shower for the first 24 hours.  You may shower after the first 24 hours.   Press button if you feel a symptom. You will hear a small click.  Record Date, Time and Symptom in the Patient Log Book.   When you are ready to remove patch, follow instructions on last 2 pages of Patient Log Book.  Stick patch monitor onto last page of Patient Log Book.   Place Patient Log Book in [place] box.  Use locking tab  on box and tape box closed securely.  The [employer] and [employer] has [employer] on it.  Please place in mailbox as soon as possible.  Your physician should have your test results approximately 7 days after the monitor has been mailed back to [facility]   Call [facility] at [phone] if you have questions regarding your ZIO XT patch monitor.  Call them immediately if you see an orange light blinking on your monitor.   If your monitor falls off in less than 4 days contact our Monitor department at  [phone].  If your monitor becomes loose or falls off after 4 days call [employer] at [phone] for suggestions on securing your monitor.

## 2022-12-20 NOTE — Progress Notes (Signed)
Cardiology Office Note  Date: 12/20/2022   ID: Jordie, Bumann [birth year], MRN [id number]  PCP:  [name], [place]  Cardiologist:  Chalmers Guest, MD Electrophysiologist:  None   Reason for Office Visit: Syncope evaluation at the request of Dr. Court Joy   History of Present Illness: Kristina Delacruz is a 42 y.o. female known to have GERD was referred to cardiology clinic for evaluation of syncope.  Patient reported having few syncopal spells every month since 2015. She has a prodrome of chest tightness/pressure, dizziness, palpitations followed by blacking out completely. She loses consciousness for a few seconds to a minute. No confusion after she regains consciousness. She eats healthy and drinks adequate water every day. Orthostatic vitals today were negative for orthostatic hypotension and POTS.  She does have a family history of bicuspid aortic valve (per patient) on her father had aortic valve replacement when he was 42 years old.  She says she has a genetic condition running in the family and does not exactly know the name of it. Former smoker, denies alcohol and illicit drug use.  Past Medical History:  Diagnosis Date   GERD (gastroesophageal reflux disease)    Trichimoniasis 08/13/2021   08/13/21 treated with flagyl.    Past Surgical History:  Procedure Laterality Date   DILATATION AND CURETTAGE/HYSTEROSCOPY WITH MINERVA N/A 04/24/2022   Procedure: DILATATION AND CURETTAGE/HYSTEROSCOPY WITH MINERVA;  Surgeon: Florian Buff, MD;  Location: [facility];  Service: Gynecology;  Laterality: N/A;   ESSURE TUBAL LIGATION Bilateral    LAPAROSCOPIC BILATERAL SALPINGECTOMY N/A 04/24/2022   Procedure: LAPAROSCOPIC BILATERAL SALPINGECTOMY; removal foreign object;  Surgeon: Florian Buff, MD;  Location: [facility];  Service: Gynecology;  Laterality: N/A;   TUBAL LIGATION      Current Outpatient Medications  Medication Sig Dispense Refill   Drospirenone (SLYND) 4 MG TABS Take 1 tablet  by mouth daily. 112 tablet 0   fexofenadine (ALLEGRA) 180 MG tablet Take 180 mg by mouth in the morning.     naproxen sodium (ALEVE) 220 MG tablet Take 220 mg by mouth daily as needed (pain.).     omeprazole (PRILOSEC) 40 MG capsule      pantoprazole (PROTONIX) 40 MG tablet TAKE 1 TABLET BY MOUTH EVERY DAY 30 tablet 3   traZODone (DESYREL) 150 MG tablet Take 1 tablet (150 mg total) by mouth at bedtime. 90 tablet 1   acyclovir (ZOVIRAX) 800 MG tablet Take 4,000 mg by mouth daily. (Patient not taking: Reported on 11/12/2022)     cephALEXin (KEFLEX) 500 MG capsule Take by mouth 2 (two) times daily. (Patient not taking: Reported on 12/20/2022)     Chlorphen-PE-Acetaminophen (NOREL AD) 4-10-325 MG TABS Take 1 tablet every 4 hours, while symptoms persist. Do jno take more than 6 tablets in 24hrs (Patient not taking: Reported on 12/20/2022) 20 tablet 0   erythromycin ophthalmic ointment at bedtime. (Patient not taking: Reported on 12/20/2022)     meclizine (ANTIVERT) 12.5 MG tablet Take 1 tablet (12.5 mg total) by mouth 3 (three) times daily as needed for dizziness. (Patient not taking: Reported on 12/20/2022) 30 tablet 0   methylPREDNISolone (MEDROL DOSEPAK) 4 MG TBPK tablet TAKE 6 TABLETS ON DAY 1 AS DIRECTED ON PACKAGE AND DECREASE BY 1 TAB EACH DAY FOR A TOTAL OF 6 DAYS (Patient not taking: Reported on 12/20/2022)     tobramycin (TOBREX) 0.3 % ophthalmic solution Place 1 drop into the left eye 4 (four) times daily. (Patient not taking: Reported  on 12/20/2022)     triamcinolone cream (KENALOG) 0.1 % Apply 1 Application topically 2 (two) times daily. (Patient not taking: Reported on 12/20/2022) 15 g 0   No current facility-administered medications for this visit.   Allergies:  Bee venom and Onion   Social History: The patient  reports that she has quit smoking. Her smoking use included cigarettes. She has never used smokeless tobacco. She reports that she does not currently use alcohol. She reports that she does not  use drugs.   Family History: The patient's family history includes Colon cancer in her paternal grandfather; Diabetes type I in her daughter; Heart murmur in her father; Hypercholesterolemia in her sister; Hypertension in her father and maternal grandmother; Thyroid disease in her mother, paternal aunt, paternal uncle, and sister.   ROS:  Please see the history of present illness. Otherwise, complete review of systems is positive for none.  All other systems are reviewed and negative.   Physical Exam: VS:  BP 134/82   Pulse 80   Ht 5' 2"$  (1.575 m)   Wt 165 lb 9.6 oz (75.1 kg)   SpO2 100%   BMI 30.29 kg/m , BMI Body mass index is 30.29 kg/m.  Wt Readings from Last 3 Encounters:  12/20/22 165 lb 9.6 oz (75.1 kg)  11/12/22 164 lb 0.6 oz (74.4 kg)  10/08/22 161 lb 0.6 oz (73 kg)    General: Patient appears comfortable at rest. HEENT: Conjunctiva and lids normal, oropharynx clear with moist mucosa. Neck: Supple, no elevated JVP or carotid bruits, no thyromegaly. Lungs: Clear to auscultation, nonlabored breathing at rest. Cardiac: Regular rate and rhythm, no S3 or significant systolic murmur, no pericardial rub. Abdomen: Soft, nontender, no hepatomegaly, bowel sounds present, no guarding or rebound. Extremities: No pitting edema, distal pulses 2+. Skin: Warm and dry. Musculoskeletal: No kyphosis. Neuropsychiatric: Alert and oriented x3, affect grossly appropriate.  ECG:  An ECG dated 12/20/2022 was personally reviewed today and demonstrated:  Normal sinus rhythm  Recent Labwork: 04/22/2022: ALT 13; AST 12 04/24/2022: Hemoglobin 12.7; Platelets 287 11/12/2022: BUN 13; Creatinine, Ser 0.95; Magnesium 1.9; Potassium 3.6; Sodium 140     Component Value Date/Time   CHOL 194 11/12/2022 0841   TRIG 96 11/12/2022 0841   HDL 50 11/12/2022 0841   CHOLHDL 3.9 11/12/2022 0841   LDLCALC 127 (H) 11/12/2022 0841    Other Studies Reviewed Today:   Assessment and Plan: Patient is a 42 year old  F known to have GERD was referred to cardiology clinic for evaluation of syncope.  # Syncope, unclear etiology # Dizziness -Patient has been having 2-3 syncopal episodes every month since 2015. She reported that she had a genetic condition in her running in her family and her father had aortic valve replacement at a young age, 42 years old. -Orthostatic vitals today were negative for orthostatic hypotension and POTS. -Obtain 2D echocardiogram to rule out structural heart disease and bicuspid aortic valve, exercise EKG to rule out malignant ventricular arrhythmias on exertion and 2-week non-live event monitor to rule out intermittent preexcitation. -If the above test are unremarkable, she will benefit from referral to EP.  I have spent a total of 45 minutes with patient reviewing chart, EKGs, labs and examining patient as well as establishing an assessment and plan that was discussed with the patient.  > 50% of time was spent in direct patient care.     Medication Adjustments/Labs and Tests Ordered: Current medicines are reviewed at length with the patient today.  Concerns regarding medicines are outlined above.   Tests Ordered: Orders Placed This Encounter  Procedures   Exercise Tolerance Test   LONG TERM MONITOR (3-14 DAYS)   EKG 12-Lead   ECHOCARDIOGRAM COMPLETE    Medication Changes: No orders of the defined types were placed in this encounter.   Disposition:  Follow up  3 months  Signed, Adya Wirz Fidel Levy, MD, 12/20/2022 2:58 PM    [facility] at [facility] [address], [place], [place] [address]

## 2022-12-23 ENCOUNTER — Encounter: Payer: Self-pay | Admitting: Adult Health

## 2022-12-23 ENCOUNTER — Ambulatory Visit (INDEPENDENT_AMBULATORY_CARE_PROVIDER_SITE_OTHER): Payer: Medicaid Other | Admitting: Adult Health

## 2022-12-23 VITALS — BP 120/80 | HR 86 | Ht 62.0 in | Wt 166.0 lb

## 2022-12-23 DIAGNOSIS — R232 Flushing: Secondary | ICD-10-CM | POA: Diagnosis not present

## 2022-12-23 DIAGNOSIS — R1032 Left lower quadrant pain: Secondary | ICD-10-CM

## 2022-12-23 MED ORDER — SLYND 4 MG PO TABS
1.0000 | ORAL_TABLET | Freq: Every day | ORAL | 12 refills | Status: DC
Start: 2022-12-23 — End: 2023-12-16

## 2022-12-23 NOTE — Progress Notes (Signed)
  Subjective:     Patient ID: Kristina Delacruz, female   DOB: [birth year], 42 y.o.   MRN: [id number]  HPI Palin is a 42 year old while female, single, G1P1001, back in follow up on taking Slynd for hot flashes and much better, has had some for last 2 days, she is wearing heart monitor, being evaluated for black out spells, she days. Still has some LLQ pain, comes and goes, but better. Did have episode last 3 weeks, none today.  Last pap was negative HPV and malignancy 08/08/21   PCP is [name] NP Review of Systems Hot flashes better LLQ pain comes and goes Reviewed past medical,surgical, social and family history. Reviewed medications and allergies.       Objective:   Physical Exam BP 120/80 (BP Location: Right Arm, Patient Position: Sitting, Cuff Size: Normal)   Pulse 86   Ht 5\' 2"  (1.575 m)   Wt 166 lb (75.3 kg)   BMI 30.36 kg/m  Skin warm and dry. Lungs: clear to ausculation bilaterally. Cardiovascular: regular rate and rhythm.      Upstream - 12/23/22 1358       Pregnancy Intention Screening   Does the patient want to become pregnant in the next year? No    Does the patient's partner want to become pregnant in the next year? No    Would the patient like to discuss contraceptive options today? No      Contraception Wrap Up   Current Method Female Sterilization    End Method Female Sterilization             Assessment:     1. Hot flashes Much better,has had some last 2 days Wants to continue slynd, 1 pack given, and rx sent Meds ordered this encounter  Medications   Drospirenone (SLYND) 4 MG TABS    Sig: Take 1 tablet (4 mg total) by mouth daily.    Dispense:  28 tablet    Refill:  12    Order Specific Question:   Supervising Provider    Answer:   Tania Ade H [[id number]]     2. LLQ pain Still comes and goes, but better, did have recent episode    Plan:     Follow up in 6 months or sooner if needed

## 2022-12-26 ENCOUNTER — Ambulatory Visit (INDEPENDENT_AMBULATORY_CARE_PROVIDER_SITE_OTHER): Payer: Medicaid Other | Admitting: Family Medicine

## 2022-12-26 ENCOUNTER — Encounter: Payer: Self-pay | Admitting: Family Medicine

## 2022-12-26 VITALS — BP 123/87 | HR 84 | Ht 62.0 in | Wt 166.1 lb

## 2022-12-26 DIAGNOSIS — Z0001 Encounter for general adult medical examination with abnormal findings: Secondary | ICD-10-CM

## 2022-12-26 NOTE — Patient Instructions (Signed)
I appreciate the opportunity to provide care to you today!    Follow up: 1 week for weight management and fatigue  Please continue to a heart-healthy diet and increase your physical activities. Try to exercise for 20mns at least five times a week.   Physical activity helps: Lower your blood glucose, improve your heart health, lower your blood pressure and cholesterol, burn calories to help manage her weight, gave you energy, lower stress, and improve his sleep.  The American diabetes Association (ADA) recommends being active for 2-1/2 hours (150 minutes) or more week.  Exercise for 30 minutes, 5 days a week (150 minutes total)    It was a pleasure to see you and I look forward to continuing to work together on your health and well-being. Please do not hesitate to call the office if you need care or have questions about your care.   Have a wonderful day and week. With Gratitude, GAlvira MondayMSN, FNP-BC

## 2022-12-26 NOTE — Progress Notes (Signed)
Complete physical exam  Patient: Kristina Delacruz   DOB: 1981-07-28   42 y.o. Female  MRN: [id number]  Subjective:    Chief Complaint  Patient presents with   Annual Exam    Cpe today, pt reports fatigue, and some days body going numb. Would also like to discuss weight loss options. Would like something new for allergies.     Kristina Delacruz is a 42 y.o. female who presents today for a complete physical exam. She reports consuming a general diet.  She reports walking daily with her dog, noting that she walks 5000 steps daily.  She generally feels fairly well. She reports sleeping poorly. She does have additional problems to discuss today.  She will be following up with me in a week to address her current complaints.   Most recent fall risk assessment:    12/26/2022    4:14 PM  Fall Risk   Falls in the past year? 0  Number falls in past yr: 0  Injury with Fall? 0  Risk for fall due to : No Fall Risks  Follow up Falls evaluation completed     Most recent depression screenings:    12/26/2022    4:14 PM 11/12/2022    8:16 AM  PHQ 2/9 Scores  PHQ - 2 Score 1 2  PHQ- 9 Score 6 6    Dental: No current dental problems and No regular dental care   Patient Active Problem List   Diagnosis Date Noted   Class 1 obesity due to excess calories without serious comorbidity with body mass index (BMI) of 30.0 to 30.9 in adult 11/12/2022   Syncope and collapse 11/12/2022   Viral sinusitis 10/08/2022   Pregnancy examination or test, negative result 06/12/2022   High blood pressure 05/23/2022   Insomnia 05/23/2022   Dizziness 05/23/2022   Depression, major, single episode, moderate (HCC) 05/23/2022   GERD (gastroesophageal reflux disease) 05/23/2022   Allergies 05/23/2022   Obesity (BMI 30-39.9) 05/23/2022   Hypokalemia 05/23/2022   Loss of hearing 05/23/2022   Abdominal pain 05/23/2022   Encounter for female sterilization procedure    Encounter for annual general medical examination  with abnormal findings in adult    Menometrorrhagia    Sleep disturbance 02/05/2022   LLQ pain 02/05/2022   Moody 09/18/2021   Dysmenorrhea 09/18/2021   Trichimoniasis 08/13/2021   Routine Papanicolaou smear 08/08/2021   Irregular periods 08/08/2021   Night sweats 08/08/2021   Hot flashes 08/08/2021   Past Medical History:  Diagnosis Date   GERD (gastroesophageal reflux disease)    Trichimoniasis 08/13/2021   08/13/21 treated with flagyl.   Past Surgical History:  Procedure Laterality Date   DILATATION AND CURETTAGE/HYSTEROSCOPY WITH MINERVA N/A 04/24/2022   Procedure: DILATATION AND CURETTAGE/HYSTEROSCOPY WITH MINERVA;  Surgeon: Florian Buff, MD;  Location: [facility];  Service: Gynecology;  Laterality: N/A;   ESSURE TUBAL LIGATION Bilateral    LAPAROSCOPIC BILATERAL SALPINGECTOMY N/A 04/24/2022   Procedure: LAPAROSCOPIC BILATERAL SALPINGECTOMY; removal foreign object;  Surgeon: Florian Buff, MD;  Location: [facility];  Service: Gynecology;  Laterality: N/A;   TUBAL LIGATION     Social History   Tobacco Use   Smoking status: Former    Types: Cigarettes   Smokeless tobacco: Never  Vaping Use   Vaping Use: Never used  Substance Use Topics   Alcohol use: Not Currently   Drug use: No   Social History   Socioeconomic History   Marital status: Single  Spouse name: Not on file   Number of children: 1   Years of education: Not on file   Highest education level: Not on file  Occupational History   Not on file  Tobacco Use   Smoking status: Former    Types: Cigarettes   Smokeless tobacco: Never  Vaping Use   Vaping Use: Never used  Substance and Sexual Activity   Alcohol use: Not Currently   Drug use: No   Sexual activity: Not Currently    Birth control/protection: Surgical    Comment: tubal & ablation  Other Topics Concern   Not on file  Social History Narrative   Lives home alone   Social Determinants of Health   Financial Resource Strain: Low Risk  (08/08/2021)    Overall Financial Resource Strain (CARDIA)    Difficulty of Paying Living Expenses: Not very hard  Food Insecurity: No Food Insecurity (08/08/2021)   Hunger Vital Sign    Worried About Running Out of Food in the Last Year: Never true    Ran Out of Food in the Last Year: Never true  Transportation Needs: No Transportation Needs (08/08/2021)   PRAPARE - Hydrologist (Medical): No    Lack of Transportation (Non-Medical): No  Physical Activity: Sufficiently Active (08/08/2021)   Exercise Vital Sign    Days of Exercise per Week: 6 days    Minutes of Exercise per Session: 30 min  Stress: No Stress Concern Present (08/08/2021)   [place]    Feeling of Stress : Only a little  Social Connections: Moderately Isolated (08/08/2021)   Social Connection and Isolation Panel [NHANES]    Frequency of Communication with Friends and Family: More than three times a week    Frequency of Social Gatherings with Friends and Family: Three times a week    Attends Religious Services: More than 4 times per year    Active Member of Clubs or Organizations: No    Attends Archivist Meetings: Never    Marital Status: Divorced  Human resources officer Violence: Not At Risk (08/08/2021)   Humiliation, Afraid, Rape, and Kick questionnaire    Fear of Current or Ex-Partner: No    Emotionally Abused: No    Physically Abused: No    Sexually Abused: No   Family Status  Relation Name Status   Mother  Alive   Father  Alive   Sister  Alive   Brother  Alive   Daughter  Alive   Pat Aunt  Alive   Pat Uncle x2 [employer]   MGF  Deceased   [place]  Deceased   PGF  Deceased   Neg Hx  (Not Specified)   Family History  Problem Relation Age of Onset   Thyroid disease Mother    Hypertension Father        AVR   Heart murmur Father    Hypercholesterolemia Sister    Thyroid disease Sister    Diabetes type I Daughter     Thyroid disease Paternal Aunt    Thyroid disease Paternal Uncle    Hypertension Maternal Grandmother    Colon cancer Paternal Grandfather    Breast cancer Neg Hx    Cervical cancer Neg Hx    Allergies  Allergen Reactions   Bee Venom Swelling   Onion Rash   Tape Rash      Patient Care Team: [name], FNP as PCP -  General (Family Medicine) Mallipeddi, Quenten Raven, MD as PCP - Cardiology (Cardiology)   Outpatient Medications Prior to Visit  Medication Sig   Cetirizine HCl (ZYRTEC PO) Take by mouth.   Drospirenone (SLYND) 4 MG TABS Take 1 tablet (4 mg total) by mouth daily.   fexofenadine (ALLEGRA) 180 MG tablet Take 180 mg by mouth in the morning.   omeprazole (PRILOSEC) 40 MG capsule    pantoprazole (PROTONIX) 40 MG tablet TAKE 1 TABLET BY MOUTH EVERY DAY   traZODone (DESYREL) 150 MG tablet Take 1 tablet (150 mg total) by mouth at bedtime.   No facility-administered medications prior to visit.    Review of Systems  Constitutional:  Positive for malaise/fatigue. Negative for chills and fever.  HENT:  Negative for congestion and sinus pain.   Eyes:  Negative for pain, discharge and redness.  Respiratory:  Negative for cough, sputum production and shortness of breath.   Cardiovascular:  Negative for chest pain, palpitations, claudication and leg swelling.       Black out spells  Gastrointestinal:  Negative for diarrhea, heartburn and nausea.  Genitourinary:  Negative for flank pain and frequency.  Musculoskeletal:  Negative for back pain and joint pain.  Skin:  Negative for itching.  Neurological:  Negative for dizziness, seizures and headaches.  Endo/Heme/Allergies:  Negative for environmental allergies.  Psychiatric/Behavioral:  Negative for memory loss. The patient does not have insomnia.        Objective:    BP 123/87   Pulse 84   Ht 5' 2"$  (1.575 m)   Wt 166 lb 1.9 oz (75.4 kg)   SpO2 97%   BMI 30.38 kg/m  BP Readings from Last 3 Encounters:  12/26/22  123/87  12/23/22 120/80  12/20/22 134/82   Wt Readings from Last 3 Encounters:  12/26/22 166 lb 1.9 oz (75.4 kg)  12/23/22 166 lb (75.3 kg)  12/20/22 165 lb 9.6 oz (75.1 kg)      Physical Exam Constitutional:      Appearance: She is obese.  HENT:     Head: Normocephalic.     Left Ear: External ear normal.     Mouth/Throat:     Mouth: Mucous membranes are moist.  Eyes:     Extraocular Movements: Extraocular movements intact.     Pupils: Pupils are equal, round, and reactive to light.  Cardiovascular:     Rate and Rhythm: Normal rate and regular rhythm.     Heart sounds: No murmur heard. Pulmonary:     Effort: Pulmonary effort is normal.     Breath sounds: Normal breath sounds.  Abdominal:     Palpations: Abdomen is soft.     Tenderness: There is no right CVA tenderness or left CVA tenderness.  Musculoskeletal:     Right lower leg: No edema.     Left lower leg: No edema.  Skin:    Findings: No lesion or rash.  Neurological:     Mental Status: She is alert and oriented to person, place, and time.     GCS: GCS eye subscore is 4. GCS verbal subscore is 5. GCS motor subscore is 6.     Cranial Nerves: No facial asymmetry.     Sensory: No sensory deficit.     Motor: No weakness.     Coordination: Coordination normal. Finger-Nose-Finger Test normal.     Gait: Gait normal.  Psychiatric:        Judgment: Judgment normal.     No results found for any  visits on 12/26/22. Last CBC Lab Results  Component Value Date   WBC 9.8 04/24/2022   HGB 12.7 04/24/2022   HCT 37.5 04/24/2022   MCV 83.1 04/24/2022   MCH 28.2 04/24/2022   RDW 13.4 04/24/2022   PLT 287 [id number]   Last metabolic panel Lab Results  Component Value Date   GLUCOSE 61 (L) 11/12/2022   NA 140 11/12/2022   K 3.6 11/12/2022   CL 104 11/12/2022   CO2 21 11/12/2022   BUN 13 11/12/2022   CREATININE 0.95 11/12/2022   EGFR 77 11/12/2022   CALCIUM 9.8 11/12/2022   PROT 7.3 04/22/2022   ALBUMIN 3.9  04/22/2022   BILITOT 0.4 04/22/2022   ALKPHOS 50 04/22/2022   AST 12 (L) 04/22/2022   ALT 13 04/22/2022   ANIONGAP 7 04/24/2022   Last lipids Lab Results  Component Value Date   CHOL 194 11/12/2022   HDL 50 11/12/2022   LDLCALC 127 (H) 11/12/2022   TRIG 96 11/12/2022   CHOLHDL 3.9 11/12/2022   Last hemoglobin A1c Lab Results  Component Value Date   HGBA1C 5.3 11/12/2022   Last thyroid functions No results found for: "TSH", "T3TOTAL", "T4TOTAL", "THYROIDAB" Last vitamin D No results found for: "25OHVITD2", "25OHVITD3", "VD25OH" Last vitamin B12 and Folate No results found for: "VITAMINB12", "FOLATE"      Assessment & Plan:    Routine Health Maintenance and Physical Exam  Immunization History  Administered Date(s) Administered   PFIZER Comirnaty(Gray Top)Covid-19 Tri-Sucrose Vaccine 04/13/2020   PFIZER(Purple Top)SARS-COV-2 Vaccination 05/05/2020   Pfizer Covid-19 Vaccine Bivalent Booster 50yr & up 01/27/2021   Tdap 01/08/2015    Health Maintenance  Topic Date Due   HIV Screening  Never done   Hepatitis C Screening  Never done   COVID-19 Vaccine (4 - 2023-24 season) 07/12/2022   INFLUENZA VACCINE  02/09/2023 (Originally 06/11/2022)   PAP SMEAR-Modifier  08/08/2024   DTaP/Tdap/Td (2 - Td or Tdap) 01/08/2025   HPV VACCINES  Aged Out    Discussed health benefits of physical activity, and encouraged her to engage in regular exercise appropriate for her age and condition.  Encounter for annual general medical examination with abnormal findings in adult Assessment & Plan: Physical exam as documented Counseling is done on healthy lifestyle involving commitment to 150 minutes of exercise per week,  Discussed heart-healthy diet and attaining a healthy weight Encouraged decreasing her intake of carbohydrates, saturated and trans fat, processed foods, and soda. Encouraged to increase her intake of fruits and vegetables Encouraged to increase her physical activity, at  least 150 minutes of moderate intensity exercises weekly Will follow-up in a week to address weight loss, fatigue, and allergies medication Patient verbalized understanding is aware of plan of care        Return in about 1 year (around 12/27/2023) for CPE.     GAlvira Monday FNP

## 2022-12-27 ENCOUNTER — Encounter: Payer: 59 | Admitting: Family Medicine

## 2022-12-27 ENCOUNTER — Encounter: Payer: 59 | Admitting: Nurse Practitioner

## 2022-12-27 NOTE — Assessment & Plan Note (Signed)
Physical exam as documented Counseling is done on healthy lifestyle involving commitment to 150 minutes of exercise per week,  Discussed heart-healthy diet and attaining a healthy weight Encouraged decreasing her intake of carbohydrates, saturated and trans fat, processed foods, and soda. Encouraged to increase her intake of fruits and vegetables Encouraged to increase her physical activity, at least 150 minutes of moderate intensity exercises weekly Will follow-up in a week to address weight loss, fatigue, and allergies medication Patient verbalized understanding is aware of plan of care

## 2023-01-06 ENCOUNTER — Other Ambulatory Visit: Payer: Self-pay | Admitting: Family Medicine

## 2023-01-10 DIAGNOSIS — Z419 Encounter for procedure for purposes other than remedying health state, unspecified: Secondary | ICD-10-CM | POA: Diagnosis not present

## 2023-01-13 ENCOUNTER — Ambulatory Visit (INDEPENDENT_AMBULATORY_CARE_PROVIDER_SITE_OTHER): Payer: 59 | Admitting: Family Medicine

## 2023-01-13 ENCOUNTER — Ambulatory Visit: Payer: 59 | Admitting: Family Medicine

## 2023-01-13 ENCOUNTER — Encounter: Payer: Self-pay | Admitting: Family Medicine

## 2023-01-13 VITALS — BP 142/82 | HR 92 | Ht 62.0 in | Wt 168.0 lb

## 2023-01-13 DIAGNOSIS — M25551 Pain in right hip: Secondary | ICD-10-CM | POA: Diagnosis not present

## 2023-01-13 DIAGNOSIS — G8929 Other chronic pain: Secondary | ICD-10-CM | POA: Diagnosis not present

## 2023-01-13 DIAGNOSIS — K5909 Other constipation: Secondary | ICD-10-CM | POA: Insufficient documentation

## 2023-01-13 DIAGNOSIS — K5900 Constipation, unspecified: Secondary | ICD-10-CM | POA: Insufficient documentation

## 2023-01-13 DIAGNOSIS — M25552 Pain in left hip: Secondary | ICD-10-CM | POA: Diagnosis not present

## 2023-01-13 MED ORDER — OXYCODONE-ACETAMINOPHEN 5-325 MG PO TABS: 1.0000 | ORAL_TABLET | ORAL | 0 refills | Status: AC | PRN

## 2023-01-13 NOTE — Patient Instructions (Addendum)
I appreciate the opportunity to provide care to you today!    Follow up:   3 weeks hip pain  Labs: please stop by the lab today to get your blood drawn ( RF, CRP, ERS, )  Please pick up your prescription at the pharmacy Please consider continuing conservative management for your hip pain I have provided you a short supply of your narcotics to take as needed for pain in the hips  Please stop by [facility] to get imaging studies of your hips bilaterally   Referrals today-orthopedic surgery   Please continue to a heart-healthy diet and increase your physical activities. Try to exercise for 43mns at least five times a week.      It was a pleasure to see you and I look forward to continuing to work together on your health and well-being. Please do not hesitate to call the office if you need care or have questions about your care.   Have a wonderful day and week. With Gratitude, GAlvira MondayMSN, FNP-BC
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
## 2023-01-13 NOTE — Assessment & Plan Note (Signed)
Chronic condition Reports going 45 days without having a bowel movement No history of IBS She reports taking colace with minimal relief of her symptoms Encouraged a trail of  prune juice and increased her fluid and fiber intake

## 2023-01-14 LAB — RHEUMATOID FACTOR: Rheumatoid fact SerPl-aCnc: 11.5 IU/mL (ref <14.0)

## 2023-01-14 LAB — C-REACTIVE PROTEIN: CRP: <1 mg/L (ref 0–10)

## 2023-01-16 ENCOUNTER — Ambulatory Visit (HOSPITAL_COMMUNITY)
Admission: RE | Admit: 2023-01-16 | Discharge: 2023-01-16 | Disposition: A | Discharge status: Home / Self Care | Payer: 59 | Source: Ambulatory Visit | Attending: Internal Medicine | Admitting: Internal Medicine

## 2023-01-16 ENCOUNTER — Ambulatory Visit (HOSPITAL_BASED_OUTPATIENT_CLINIC_OR_DEPARTMENT_OTHER)
Admission: RE | Admit: 2023-01-16 | Discharge: 2023-01-16 | Disposition: A | Discharge status: Home / Self Care | Payer: 59 | Source: Ambulatory Visit | Attending: Internal Medicine | Admitting: Internal Medicine

## 2023-01-16 ENCOUNTER — Ambulatory Visit (HOSPITAL_COMMUNITY)
Admission: RE | Admit: 2023-01-16 | Discharge: 2023-01-16 | Disposition: A | Discharge status: Home / Self Care | Payer: 59 | Source: Ambulatory Visit | Attending: Family Medicine | Admitting: Family Medicine

## 2023-01-16 DIAGNOSIS — R55 Syncope and collapse: Secondary | ICD-10-CM | POA: Diagnosis not present

## 2023-01-16 DIAGNOSIS — M25552 Pain in left hip: Secondary | ICD-10-CM | POA: Diagnosis not present

## 2023-01-16 DIAGNOSIS — G8929 Other chronic pain: Secondary | ICD-10-CM | POA: Insufficient documentation

## 2023-01-16 DIAGNOSIS — M25551 Pain in right hip: Secondary | ICD-10-CM | POA: Insufficient documentation

## 2023-01-16 DIAGNOSIS — I088 Other rheumatic multiple valve diseases: Secondary | ICD-10-CM | POA: Diagnosis not present

## 2023-01-16 LAB — ECHOCARDIOGRAM COMPLETE
AR max vel: 1.04 cm2
AV Area VTI: 1.15 cm2
AV Area mean vel: 1.11 cm2
AV Mean grad: 6.8 mmHg
AV Peak grad: 13.5 mmHg
Ao pk vel: 1.84 m/s
Area-P 1/2: 3.74 cm2
MV M vel: 4.44 m/s
MV Peak grad: 78.7 mmHg
MV VTI: 1.65 cm2
P 1/2 time: 504 msec
S' Lateral: 2.6 cm

## 2023-01-16 LAB — EXERCISE TOLERANCE TEST
Angina Index: 0
Duke Treadmill Score: 5
Estimated workload: 7
Exercise duration (min): 5 min
MPHR: 179 {beats}/min
Peak HR: 171 {beats}/min
Percent HR: 95 %
RPE: 14
Rest HR: 90 {beats}/min
ST Depression (mm): 0 mm

## 2023-01-16 NOTE — Progress Notes (Signed)
  Echocardiogram 2D Echocardiogram has been performed.  Kristina Delacruz 01/16/2023, 12:43 PM

## 2023-01-27 ENCOUNTER — Telehealth: Payer: Self-pay

## 2023-01-27 NOTE — Telephone Encounter (Signed)
Patient notified and verbalized understanding. Patient notified and verbalized understanding. PCP in Epic.

## 2023-01-27 NOTE — Telephone Encounter (Signed)
-----   Message from Chalmers Guest, MD sent at 01/26/2023  8:18 AM EDT ----- Echo showed normal pumping function of the heart, mild MR and mild to moderate AR. Has leakiness across mitral and aortic valves which will need to be monitored every year. Obtain Echo in one year. This degree of valve leakiness should not cause dizziness or syncope.

## 2023-01-30 ENCOUNTER — Other Ambulatory Visit: Payer: Self-pay | Admitting: Internal Medicine

## 2023-01-30 DIAGNOSIS — F321 Major depressive disorder, single episode, moderate: Secondary | ICD-10-CM

## 2023-02-03 ENCOUNTER — Ambulatory Visit (INDEPENDENT_AMBULATORY_CARE_PROVIDER_SITE_OTHER): Payer: 59 | Admitting: Orthopedic Surgery

## 2023-02-03 ENCOUNTER — Encounter: Payer: Self-pay | Admitting: Orthopedic Surgery

## 2023-02-03 ENCOUNTER — Ambulatory Visit (INDEPENDENT_AMBULATORY_CARE_PROVIDER_SITE_OTHER): Payer: 59 | Admitting: Family Medicine

## 2023-02-03 ENCOUNTER — Encounter: Payer: Self-pay | Admitting: Family Medicine

## 2023-02-03 ENCOUNTER — Other Ambulatory Visit (INDEPENDENT_AMBULATORY_CARE_PROVIDER_SITE_OTHER): Payer: 59

## 2023-02-03 VITALS — BP 132/68 | HR 74 | Ht 62.0 in | Wt 167.0 lb

## 2023-02-03 VITALS — BP 162/101 | HR 81 | Ht 62.0 in | Wt 167.6 lb

## 2023-02-03 DIAGNOSIS — G8929 Other chronic pain: Secondary | ICD-10-CM

## 2023-02-03 DIAGNOSIS — J309 Allergic rhinitis, unspecified: Secondary | ICD-10-CM

## 2023-02-03 DIAGNOSIS — M4306 Spondylolysis, lumbar region: Secondary | ICD-10-CM | POA: Diagnosis not present

## 2023-02-03 DIAGNOSIS — M25552 Pain in left hip: Secondary | ICD-10-CM

## 2023-02-03 DIAGNOSIS — M25551 Pain in right hip: Secondary | ICD-10-CM | POA: Diagnosis not present

## 2023-02-03 DIAGNOSIS — K219 Gastro-esophageal reflux disease without esophagitis: Secondary | ICD-10-CM | POA: Diagnosis not present

## 2023-02-03 DIAGNOSIS — J3089 Other allergic rhinitis: Secondary | ICD-10-CM | POA: Diagnosis not present

## 2023-02-03 DIAGNOSIS — M545 Low back pain, unspecified: Secondary | ICD-10-CM

## 2023-02-03 MED ORDER — METHOCARBAMOL 500 MG PO TABS: 500.0000 mg | ORAL_TABLET | Freq: Four times a day (QID) | ORAL | 1 refills | Status: DC | PRN

## 2023-02-03 MED ORDER — MELOXICAM 7.5 MG PO TABS: 7.5000 mg | ORAL_TABLET | Freq: Every day | ORAL | 1 refills | Status: DC

## 2023-02-03 MED ORDER — GABAPENTIN 100 MG PO CAPS: ORAL_CAPSULE | ORAL | 2 refills | Status: DC

## 2023-02-03 MED ORDER — MONTELUKAST SODIUM 10 MG PO TABS: 10.0000 mg | ORAL_TABLET | Freq: Every day | ORAL | 3 refills | Status: DC

## 2023-02-03 NOTE — Addendum Note (Signed)
Addended byCandice Camp on: 02/03/2023 03:35 PM   Modules accepted: Orders

## 2023-02-03 NOTE — Patient Instructions (Addendum)
For pain control   Take the following medications  Tylenol 500 mg every 6 hours  Meloxicam 7.5 mg daily  Gabapentin 200 mg at night  Robaxin 500 mg every 6 hours as needed  Physical therapy has been ordered for you at [facility]. They should call you to schedule, [phone] is the phone number to call, if you want to call to schedule.

## 2023-02-03 NOTE — Progress Notes (Unsigned)
Established Patient Office Visit  Subjective:  Patient ID: Kristina Delacruz, female    DOB: December 20, 1980  Age: 42 y.o. MRN: [id number]  CC: No chief complaint on file.   HPI Kristina Delacruz is a 42 y.o. female with past medical history of *** presents for f/u of *** chronic medical conditions.   The 10-year ASCVD risk score (Arnett DK, et al., 2019) is: 0.8%   Values used to calculate the score:     Age: 76 years     Sex: Female     Is Non-Hispanic African American: No     Diabetic: No     Tobacco smoker: No     Systolic Blood Pressure: [id number] mmHg     Is BP treated: No     HDL Cholesterol: 50 mg/dL     Total Cholesterol: 194 mg/dL   Past Medical History:  Diagnosis Date   GERD (gastroesophageal reflux disease)    Trichimoniasis 08/13/2021   08/13/21 treated with flagyl.    Past Surgical History:  Procedure Laterality Date   DILATATION AND CURETTAGE/HYSTEROSCOPY WITH MINERVA N/A 04/24/2022   Procedure: DILATATION AND CURETTAGE/HYSTEROSCOPY WITH MINERVA;  Surgeon: Florian Buff, MD;  Location: [facility];  Service: Gynecology;  Laterality: N/A;   ESSURE TUBAL LIGATION Bilateral    LAPAROSCOPIC BILATERAL SALPINGECTOMY N/A 04/24/2022   Procedure: LAPAROSCOPIC BILATERAL SALPINGECTOMY; removal foreign object;  Surgeon: Florian Buff, MD;  Location: [facility];  Service: Gynecology;  Laterality: N/A;   TUBAL LIGATION      Family History  Problem Relation Age of Onset   Thyroid disease Mother    Hypertension Father        AVR   Heart murmur Father    Hypercholesterolemia Sister    Thyroid disease Sister    Diabetes type I Daughter    Thyroid disease Paternal Aunt    Thyroid disease Paternal Uncle    Hypertension Maternal Grandmother    Colon cancer Paternal Grandfather    Breast cancer Neg Hx    Cervical cancer Neg Hx     Social History   Socioeconomic History   Marital status: Single    Spouse name: Not on file   Number of children: 1   Years of education: Not on file    Highest education level: Not on file  Occupational History   Not on file  Tobacco Use   Smoking status: Former    Types: Cigarettes   Smokeless tobacco: Never  Vaping Use   Vaping Use: Never used  Substance and Sexual Activity   Alcohol use: Not Currently   Drug use: No   Sexual activity: Not Currently    Birth control/protection: Surgical    Comment: tubal & ablation  Other Topics Concern   Not on file  Social History Narrative   Lives home alone   Social Determinants of Health   Financial Resource Strain: Low Risk  (08/08/2021)   Overall Financial Resource Strain (CARDIA)    Difficulty of Paying Living Expenses: Not very hard  Food Insecurity: No Food Insecurity (08/08/2021)   Hunger Vital Sign    Worried About Running Out of Food in the Last Year: Never true    Ran Out of Food in the Last Year: Never true  Transportation Needs: No Transportation Needs (08/08/2021)   PRAPARE - Hydrologist (Medical): No    Lack of Transportation (Non-Medical): No  Physical Activity: Sufficiently Active (08/08/2021)   Exercise Vital Sign  Days of Exercise per Week: 6 days    Minutes of Exercise per Session: 30 min  Stress: No Stress Concern Present (08/08/2021)   [place]    Feeling of Stress : Only a little  Social Connections: Moderately Isolated (08/08/2021)   Social Connection and Isolation Panel [NHANES]    Frequency of Communication with Friends and Family: More than three times a week    Frequency of Social Gatherings with Friends and Family: Three times a week    Attends Religious Services: More than 4 times per year    Active Member of Clubs or Organizations: No    Attends Archivist Meetings: Never    Marital Status: Divorced  Human resources officer Violence: Not At Risk (08/08/2021)   Humiliation, Afraid, Rape, and Kick questionnaire    Fear of Current or Ex-Partner: No     Emotionally Abused: No    Physically Abused: No    Sexually Abused: No    Outpatient Medications Prior to Visit  Medication Sig Dispense Refill   Cetirizine HCl (ZYRTEC PO) Take by mouth.     Drospirenone (SLYND) 4 MG TABS Take 1 tablet (4 mg total) by mouth daily. 28 tablet 12   fexofenadine (ALLEGRA) 180 MG tablet Take 180 mg by mouth in the morning.     omeprazole (PRILOSEC) 40 MG capsule      pantoprazole (PROTONIX) 40 MG tablet TAKE 1 TABLET BY MOUTH EVERY DAY 30 tablet 3   traZODone (DESYREL) 150 MG tablet TAKE 1 TABLET BY MOUTH AT BEDTIME. 90 tablet 2   No facility-administered medications prior to visit.    Allergies  Allergen Reactions   Bee Venom Swelling   Onion Rash   Tape Rash    ROS Review of Systems    Objective:    Physical Exam  There were no vitals taken for this visit. Wt Readings from Last 3 Encounters:  01/13/23 168 lb (76.2 kg)  12/26/22 166 lb 1.9 oz (75.4 kg)  12/23/22 166 lb (75.3 kg)    No results found for: "TSH" Lab Results  Component Value Date   WBC 9.8 04/24/2022   HGB 12.7 04/24/2022   HCT 37.5 04/24/2022   MCV 83.1 04/24/2022   PLT 287 04/24/2022   Lab Results  Component Value Date   NA 140 11/12/2022   K 3.6 11/12/2022   CO2 21 11/12/2022   GLUCOSE 61 (L) 11/12/2022   BUN 13 11/12/2022   CREATININE 0.95 11/12/2022   BILITOT 0.4 04/22/2022   ALKPHOS 50 04/22/2022   AST 12 (L) 04/22/2022   ALT 13 04/22/2022   PROT 7.3 04/22/2022   ALBUMIN 3.9 04/22/2022   CALCIUM 9.8 11/12/2022   ANIONGAP 7 04/24/2022   EGFR 77 11/12/2022   Lab Results  Component Value Date   CHOL 194 11/12/2022   Lab Results  Component Value Date   HDL 50 11/12/2022   Lab Results  Component Value Date   LDLCALC 127 (H) 11/12/2022   Lab Results  Component Value Date   TRIG 96 11/12/2022   Lab Results  Component Value Date   CHOLHDL 3.9 11/12/2022   Lab Results  Component Value Date   HGBA1C 5.3 11/12/2022      Assessment &  Plan:  There are no diagnoses linked to this encounter.  Follow-up: No follow-ups on file.   [name], FNP

## 2023-02-03 NOTE — Patient Instructions (Addendum)
I appreciate the opportunity to provide care to you today!    Follow up:  3 months  Labs: next visit  _ please start taking meloxicam 7.5 mg daily for your hip pain   - please start taking singulair 10 mg daily    Please continue to a heart-healthy diet and increase your physical activities. Try to exercise for 17mins at least five days a week.      It was a pleasure to see you and I look forward to continuing to work together on your health and well-being. Please do not hesitate to call the office if you need care or have questions about your care.   Have a wonderful day and week. With Gratitude, [name] MSN, FNP-BC
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
## 2023-02-04 NOTE — Assessment & Plan Note (Signed)
Complains of chronic bilateral hip pain We will treat today with meloxicam 7.5 mg daily The patient is following up with orthopedics today

## 2023-02-04 NOTE — Assessment & Plan Note (Signed)
Reports flareup of her allergies Complains of increased sneezing and  runny nose Will start patient on Singulair 10 mg daily Encourage to take Xyzal 5 mg daily over-the-counter

## 2023-02-04 NOTE — Assessment & Plan Note (Signed)
Stable on Protonix 40 mg daily No complaints or concerns voiced

## 2023-02-10 DIAGNOSIS — Z419 Encounter for procedure for purposes other than remedying health state, unspecified: Secondary | ICD-10-CM | POA: Diagnosis not present

## 2023-02-16 ENCOUNTER — Other Ambulatory Visit: Payer: Self-pay | Admitting: Family Medicine

## 2023-02-19 ENCOUNTER — Ambulatory Visit (HOSPITAL_COMMUNITY): Payer: 59 | Attending: Orthopedic Surgery

## 2023-02-19 ENCOUNTER — Other Ambulatory Visit: Payer: Self-pay

## 2023-02-19 DIAGNOSIS — M545 Low back pain, unspecified: Secondary | ICD-10-CM | POA: Diagnosis not present

## 2023-02-19 DIAGNOSIS — M4306 Spondylolysis, lumbar region: Secondary | ICD-10-CM | POA: Insufficient documentation

## 2023-02-19 DIAGNOSIS — M5459 Other low back pain: Secondary | ICD-10-CM | POA: Diagnosis not present
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
## 2023-02-26 ENCOUNTER — Ambulatory Visit (HOSPITAL_COMMUNITY): Payer: 59 | Admitting: Physical Therapy

## 2023-02-26 DIAGNOSIS — M5459 Other low back pain: Secondary | ICD-10-CM | POA: Diagnosis not present

## 2023-02-26 DIAGNOSIS — M4306 Spondylolysis, lumbar region: Secondary | ICD-10-CM | POA: Diagnosis not present

## 2023-02-26 DIAGNOSIS — M545 Low back pain, unspecified: Secondary | ICD-10-CM | POA: Diagnosis not present
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
## 2023-03-04 ENCOUNTER — Encounter (HOSPITAL_COMMUNITY): Payer: Self-pay

## 2023-03-04 ENCOUNTER — Ambulatory Visit (HOSPITAL_COMMUNITY): Payer: 59

## 2023-03-04 DIAGNOSIS — M4306 Spondylolysis, lumbar region: Secondary | ICD-10-CM | POA: Diagnosis not present

## 2023-03-04 DIAGNOSIS — M5459 Other low back pain: Secondary | ICD-10-CM | POA: Diagnosis not present

## 2023-03-04 DIAGNOSIS — M545 Low back pain, unspecified: Secondary | ICD-10-CM | POA: Diagnosis not present
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
## 2023-03-06 ENCOUNTER — Encounter (HOSPITAL_COMMUNITY): Payer: Self-pay

## 2023-03-06 ENCOUNTER — Ambulatory Visit (HOSPITAL_COMMUNITY): Payer: 59

## 2023-03-06 DIAGNOSIS — M5459 Other low back pain: Secondary | ICD-10-CM

## 2023-03-06 DIAGNOSIS — M4306 Spondylolysis, lumbar region: Secondary | ICD-10-CM | POA: Diagnosis not present

## 2023-03-06 DIAGNOSIS — M545 Low back pain, unspecified: Secondary | ICD-10-CM | POA: Diagnosis not present
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
## 2023-03-11 ENCOUNTER — Encounter (HOSPITAL_COMMUNITY): Payer: Self-pay

## 2023-03-11 ENCOUNTER — Ambulatory Visit (HOSPITAL_COMMUNITY): Payer: 59

## 2023-03-11 DIAGNOSIS — M545 Low back pain, unspecified: Secondary | ICD-10-CM | POA: Diagnosis not present

## 2023-03-11 DIAGNOSIS — M5459 Other low back pain: Secondary | ICD-10-CM | POA: Diagnosis not present

## 2023-03-11 DIAGNOSIS — M4306 Spondylolysis, lumbar region: Secondary | ICD-10-CM | POA: Diagnosis not present
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
## 2023-03-12 DIAGNOSIS — Z419 Encounter for procedure for purposes other than remedying health state, unspecified: Secondary | ICD-10-CM | POA: Diagnosis not present

## 2023-03-13 ENCOUNTER — Ambulatory Visit (HOSPITAL_COMMUNITY): Payer: 59 | Attending: Orthopedic Surgery

## 2023-03-13 ENCOUNTER — Encounter (HOSPITAL_COMMUNITY): Payer: Self-pay

## 2023-03-13 DIAGNOSIS — M5459 Other low back pain: Secondary | ICD-10-CM

## 2023-03-13 DIAGNOSIS — I351 Nonrheumatic aortic (valve) insufficiency: Secondary | ICD-10-CM | POA: Insufficient documentation
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
## 2023-03-17 ENCOUNTER — Ambulatory Visit (HOSPITAL_COMMUNITY): Payer: 59 | Admitting: Physical Therapy

## 2023-03-17 DIAGNOSIS — M5459 Other low back pain: Secondary | ICD-10-CM

## 2023-03-17 DIAGNOSIS — I351 Nonrheumatic aortic (valve) insufficiency: Secondary | ICD-10-CM | POA: Diagnosis not present
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
## 2023-03-18 ENCOUNTER — Other Ambulatory Visit: Payer: Self-pay | Admitting: Family Medicine

## 2023-03-18 DIAGNOSIS — K219 Gastro-esophageal reflux disease without esophagitis: Secondary | ICD-10-CM

## 2023-03-18 MED ORDER — PANTOPRAZOLE SODIUM 40 MG PO TBEC
40.0000 mg | DELAYED_RELEASE_TABLET | ORAL | 1 refills | Status: DC | PRN
Start: 2023-03-18 — End: 2023-08-22

## 2023-03-19 ENCOUNTER — Encounter (HOSPITAL_COMMUNITY): Payer: Self-pay

## 2023-03-19 ENCOUNTER — Ambulatory Visit (HOSPITAL_COMMUNITY): Payer: 59

## 2023-03-19 DIAGNOSIS — I351 Nonrheumatic aortic (valve) insufficiency: Secondary | ICD-10-CM | POA: Diagnosis not present

## 2023-03-19 DIAGNOSIS — M5459 Other low back pain: Secondary | ICD-10-CM
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
## 2023-03-21 ENCOUNTER — Ambulatory Visit (HOSPITAL_COMMUNITY): Payer: 59

## 2023-03-21 ENCOUNTER — Encounter (HOSPITAL_COMMUNITY): Payer: Self-pay

## 2023-03-21 DIAGNOSIS — I351 Nonrheumatic aortic (valve) insufficiency: Secondary | ICD-10-CM | POA: Diagnosis not present

## 2023-03-21 DIAGNOSIS — M5459 Other low back pain: Secondary | ICD-10-CM
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
## 2023-03-25 ENCOUNTER — Encounter (HOSPITAL_COMMUNITY): Payer: 59 | Admitting: Physical Therapy

## 2023-03-25 ENCOUNTER — Ambulatory Visit (HOSPITAL_COMMUNITY): Payer: 59 | Admitting: Physical Therapy

## 2023-03-25 DIAGNOSIS — M5459 Other low back pain: Secondary | ICD-10-CM | POA: Diagnosis not present

## 2023-03-25 DIAGNOSIS — I351 Nonrheumatic aortic (valve) insufficiency: Secondary | ICD-10-CM | POA: Diagnosis not present
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
## 2023-03-27 ENCOUNTER — Encounter (HOSPITAL_COMMUNITY): Payer: 59

## 2023-04-01 ENCOUNTER — Encounter (HOSPITAL_COMMUNITY): Payer: 59

## 2023-04-03 ENCOUNTER — Encounter (HOSPITAL_COMMUNITY): Payer: 59 | Admitting: Physical Therapy

## 2023-04-08 ENCOUNTER — Encounter: Payer: Self-pay | Admitting: Internal Medicine

## 2023-04-08 ENCOUNTER — Ambulatory Visit (INDEPENDENT_AMBULATORY_CARE_PROVIDER_SITE_OTHER): Payer: 59 | Admitting: Internal Medicine

## 2023-04-08 VITALS — BP 126/92 | HR 95 | Ht 62.0 in | Wt 166.2 lb

## 2023-04-08 DIAGNOSIS — I351 Nonrheumatic aortic (valve) insufficiency: Secondary | ICD-10-CM | POA: Diagnosis not present

## 2023-04-08 NOTE — Progress Notes (Signed)
Cardiology Office Note  Date: 04/08/2023   ID: Wardah, Holsey [birth year], MRN [id number]  PCP:  Gilmore Laroche, FNP  Cardiologist:  Marjo Bicker, MD Electrophysiologist:  None   Reason for Office Visit: Follow-up of aortic valve regurgitation   History of Present Illness: Kristina Delacruz is a 42 y.o. female known to have mild to moderate aortic valve regurgitation, GERD is here for follow-up visit.  Patient was initially referred to cardiology clinic in 12/2022 for evaluation of syncope.  She reported having few syncopal spells every month since 2015. She has a prodrome of chest tightness/pressure, dizziness, palpitations followed by blacking out completely. She loses consciousness for a few seconds to a minute. No confusion after she regains consciousness. She eats healthy and drinks adequate water every day. Orthostatic vitals  were negative for orthostatic hypotension and POTS. She does have a family history of bicuspid aortic valve (per patient) on her father, had aortic valve replacement when he was 42 years old.  She says she has a genetic condition running in the family and does not exactly know the name of it. Former smoker, denies alcohol and illicit drug use.  In 01/2023, echocardiogram showed normal LVEF, mild to moderate AI and mild MR; exercise tolerance test showed no evidence of ischemia and a cardiac event monitor was unremarkable and did not show any evidence of pauses or arrhythmias.  She is here for follow-up visit.  She is extremely stressed out and nervous during my interview. She does have symptoms of decreased exercise capacity and SOB in the last 1 year. She does have chest heaviness especially at nighttime when she lays on her back but this could be from her GERD medication as well. She is currently on omeprazole and pantoprazole as needed. Whenever she misses her GERD medications, she starts to experience chest heaviness.  Past Medical History:  Diagnosis  Date   GERD (gastroesophageal reflux disease)    Trichimoniasis 08/13/2021   08/13/21 treated with flagyl.    Past Surgical History:  Procedure Laterality Date   DILATATION AND CURETTAGE/HYSTEROSCOPY WITH MINERVA N/A 04/24/2022   Procedure: DILATATION AND CURETTAGE/HYSTEROSCOPY WITH MINERVA;  Surgeon: Lazaro Arms, MD;  Location: [facility];  Service: Gynecology;  Laterality: N/A;   ESSURE TUBAL LIGATION Bilateral    LAPAROSCOPIC BILATERAL SALPINGECTOMY N/A 04/24/2022   Procedure: LAPAROSCOPIC BILATERAL SALPINGECTOMY; removal foreign object;  Surgeon: Lazaro Arms, MD;  Location: [facility];  Service: Gynecology;  Laterality: N/A;   TUBAL LIGATION      Current Outpatient Medications  Medication Sig Dispense Refill   Cetirizine HCl (ZYRTEC PO) Take by mouth.     Drospirenone (SLYND) 4 MG TABS Take 1 tablet (4 mg total) by mouth daily. 28 tablet 12   fexofenadine (ALLEGRA) 180 MG tablet Take 180 mg by mouth in the morning.     gabapentin (NEURONTIN) 100 MG capsule 2 at night 90 capsule 2   Melatonin 5 MG CHEW Chew by mouth.     meloxicam (MOBIC) 7.5 MG tablet Take 1 tablet (7.5 mg total) by mouth daily. 60 tablet 1   methocarbamol (ROBAXIN) 500 MG tablet Take 1 tablet (500 mg total) by mouth every 6 (six) hours as needed for muscle spasms. 60 tablet 1   montelukast (SINGULAIR) 10 MG tablet Take 1 tablet (10 mg total) by mouth at bedtime. 30 tablet 3   omeprazole (PRILOSEC) 40 MG capsule      pantoprazole (PROTONIX) 40 MG tablet Take 1 tablet (40  mg total) by mouth as needed. 90 tablet 1   traZODone (DESYREL) 150 MG tablet TAKE 1 TABLET BY MOUTH AT BEDTIME. 90 tablet 2   No current facility-administered medications for this visit.   Allergies:  Bee venom, Onion, and Tape   Social History: The patient  reports that she has quit smoking. Her smoking use included cigarettes. She has never used smokeless tobacco. She reports that she does not currently use alcohol. She reports that she does not  use drugs.   Family History: The patient's family history includes Colon cancer in her paternal grandfather; Diabetes type I in her daughter; Heart murmur in her father; Hypercholesterolemia in her sister; Hypertension in her father and maternal grandmother; Thyroid disease in her mother, paternal aunt, paternal uncle, and sister.   ROS:  Please see the history of present illness. Otherwise, complete review of systems is positive for none.  All other systems are reviewed and negative.   Physical Exam: VS:  There were no vitals taken for this visit., BMI There is no height or weight on file to calculate BMI.  Wt Readings from Last 3 Encounters:  02/03/23 167 lb 9.6 oz (76 kg)  02/03/23 167 lb 0.6 oz (75.8 kg)  01/13/23 168 lb (76.2 kg)    General: Patient appears comfortable at rest. HEENT: Conjunctiva and lids normal, oropharynx clear with moist mucosa. Neck: Supple, no elevated JVP or carotid bruits, no thyromegaly. Lungs: Clear to auscultation, nonlabored breathing at rest. Cardiac: Regular rate and rhythm, no S3 or significant systolic murmur, no pericardial rub. Abdomen: Soft, nontender, no hepatomegaly, bowel sounds present, no guarding or rebound. Extremities: No pitting edema, distal pulses 2+. Skin: Warm and dry. Musculoskeletal: No kyphosis. Neuropsychiatric: Alert and oriented x3, affect grossly appropriate.  ECG:  An ECG dated 12/20/2022 was personally reviewed today and demonstrated:  Normal sinus rhythm  Recent Labwork: 04/22/2022: ALT 13; AST 12 04/24/2022: Hemoglobin 12.7; Platelets 287 11/12/2022: BUN 13; Creatinine, Ser 0.95; Magnesium 1.9; Potassium 3.6; Sodium 140     Component Value Date/Time   CHOL 194 11/12/2022 0841   TRIG 96 11/12/2022 0841   HDL 50 11/12/2022 0841   CHOLHDL 3.9 11/12/2022 0841   LDLCALC 127 (H) 11/12/2022 0841    Other Studies Reviewed Today:   Assessment and Plan: Patient is a 41 year old F known to have mild to moderate aortic valve  regurgitation, GERD is here for follow-up visit.  # Non-cardiac syncope -Patient has been having 2-3 syncopal episodes every month since 2015. Orthostatic vitals were negative for orthostatic hypotension and POTS. Cardiac event monitor from 01/2023 showed no evidence of pauses/AV block/arrhythmias.  No evidence of preexcitation.  Echocardiogram ruled out any structural heart disease.  # Mild to moderate aortic valve regurgitation in 2024 -Patient has a strong family history of aortic valve replacement in her father, grandmother and her daughter also sees a cardiologist.  Echocardiogram showed tricuspid aortic valve with mild to moderate aortic valve regurgitation. Aorta is within normal limits.  She does have decreased exercise tolerance and SOB for the last 1 year.  Will repeat 2D echocardiogram in 10/2023 and follow-up in 11/2023.  I have spent a total of 30 minutes with patient reviewing chart, EKGs, labs and examining patient as well as establishing an assessment and plan that was discussed with the patient.  > 50% of time was spent in direct patient care.     Medication Adjustments/Labs and Tests Ordered: Current medicines are reviewed at length with the patient today.  Concerns regarding medicines are outlined above.   Tests Ordered: Orders Placed This Encounter  Procedures   ECHOCARDIOGRAM COMPLETE     Medication Changes: No orders of the defined types were placed in this encounter.   Disposition:  Follow up  8 months  Signed, Adisson Deak Verne Spurr, MD, 04/08/2023 1:00 PM    [facility] [address], [place], [place] [address]

## 2023-04-08 NOTE — Patient Instructions (Addendum)
Medication Instructions:  Your physician recommends that you continue on your current medications as directed. Please refer to the Current Medication list given to you today.  Labwork: none  Testing/Procedures: Your physician has requested that you have an echocardiogram in December 2024. Echocardiography is a painless test that uses sound waves to create images of your heart. It provides your doctor with information about the size and shape of your heart and how well your heart's chambers and valves are working. This procedure takes approximately one hour. There are no restrictions for this procedure. Please do NOT wear cologne, perfume, aftershave, or lotions (deodorant is allowed). Please arrive 15 minutes prior to your appointment time.  Follow-Up: Your physician recommends that you schedule a follow-up appointment in: 8 months  Any Other Special Instructions Will Be Listed Below (If Applicable).  If you need a refill on your cardiac medications before your next appointment, please call your pharmacy.

## 2023-04-12 DIAGNOSIS — Z419 Encounter for procedure for purposes other than remedying health state, unspecified: Secondary | ICD-10-CM | POA: Diagnosis not present

## 2023-04-24 ENCOUNTER — Encounter: Payer: Self-pay | Admitting: Orthopedic Surgery

## 2023-04-24 ENCOUNTER — Ambulatory Visit (INDEPENDENT_AMBULATORY_CARE_PROVIDER_SITE_OTHER): Payer: 59 | Admitting: Orthopedic Surgery

## 2023-04-24 VITALS — BP 121/78 | HR 79 | Ht 62.0 in | Wt 166.0 lb

## 2023-04-24 DIAGNOSIS — M5442 Lumbago with sciatica, left side: Secondary | ICD-10-CM

## 2023-04-24 DIAGNOSIS — G8929 Other chronic pain: Secondary | ICD-10-CM | POA: Diagnosis not present

## 2023-04-24 DIAGNOSIS — M4306 Spondylolysis, lumbar region: Secondary | ICD-10-CM
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
## 2023-04-28 ENCOUNTER — Telehealth: Payer: Self-pay | Admitting: Orthopedic Surgery

## 2023-04-28 NOTE — Telephone Encounter (Signed)
Call patient she has [employer] the [facility] will need to be at [facility]

## 2023-04-28 NOTE — Telephone Encounter (Signed)
Kristina Delacruz Patient called left voicemail at 2:42 pm  and wants to know why her MRI was canceled for tomorrow and she was not notified why.   Please call her back at [phone]

## 2023-04-29 ENCOUNTER — Ambulatory Visit (HOSPITAL_COMMUNITY): Payer: 59

## 2023-04-29 NOTE — Telephone Encounter (Signed)
I called her to advise that MRI has to be at [facility] imaging she will call to schedule

## 2023-05-08 ENCOUNTER — Ambulatory Visit: Payer: 59 | Admitting: Orthopedic Surgery

## 2023-05-12 ENCOUNTER — Ambulatory Visit: Payer: 59 | Admitting: Family Medicine

## 2023-05-12 DIAGNOSIS — Z419 Encounter for procedure for purposes other than remedying health state, unspecified: Secondary | ICD-10-CM | POA: Diagnosis not present

## 2023-05-14 ENCOUNTER — Other Ambulatory Visit: Payer: Self-pay | Admitting: Family Medicine

## 2023-05-14 ENCOUNTER — Encounter: Payer: Self-pay | Admitting: Family Medicine

## 2023-05-14 ENCOUNTER — Encounter: Payer: Self-pay | Admitting: Orthopedic Surgery

## 2023-05-17 ENCOUNTER — Ambulatory Visit
Admission: RE | Admit: 2023-05-17 | Discharge: 2023-05-17 | Disposition: A | Discharge status: Home / Self Care | Payer: 59 | Source: Ambulatory Visit | Attending: Orthopedic Surgery | Admitting: Orthopedic Surgery

## 2023-05-17 DIAGNOSIS — G8929 Other chronic pain: Secondary | ICD-10-CM

## 2023-05-17 DIAGNOSIS — M5416 Radiculopathy, lumbar region: Secondary | ICD-10-CM | POA: Diagnosis not present

## 2023-05-19 ENCOUNTER — Encounter: Payer: Self-pay | Admitting: Family Medicine

## 2023-05-19 ENCOUNTER — Ambulatory Visit (INDEPENDENT_AMBULATORY_CARE_PROVIDER_SITE_OTHER): Payer: 59 | Admitting: Family Medicine

## 2023-05-19 VITALS — BP 130/82 | HR 85 | Ht 62.0 in | Wt 168.1 lb

## 2023-05-19 DIAGNOSIS — E038 Other specified hypothyroidism: Secondary | ICD-10-CM

## 2023-05-19 DIAGNOSIS — M545 Low back pain, unspecified: Secondary | ICD-10-CM

## 2023-05-19 DIAGNOSIS — R7301 Impaired fasting glucose: Secondary | ICD-10-CM

## 2023-05-19 DIAGNOSIS — Z114 Encounter for screening for human immunodeficiency virus [HIV]: Secondary | ICD-10-CM

## 2023-05-19 DIAGNOSIS — M25551 Pain in right hip: Secondary | ICD-10-CM | POA: Diagnosis not present

## 2023-05-19 DIAGNOSIS — M25552 Pain in left hip: Secondary | ICD-10-CM | POA: Diagnosis not present

## 2023-05-19 DIAGNOSIS — E7849 Other hyperlipidemia: Secondary | ICD-10-CM

## 2023-05-19 DIAGNOSIS — G8929 Other chronic pain: Secondary | ICD-10-CM

## 2023-05-19 DIAGNOSIS — E669 Obesity, unspecified: Secondary | ICD-10-CM | POA: Diagnosis not present

## 2023-05-19 DIAGNOSIS — Z1159 Encounter for screening for other viral diseases: Secondary | ICD-10-CM

## 2023-05-19 DIAGNOSIS — M7989 Other specified soft tissue disorders: Secondary | ICD-10-CM

## 2023-05-19 DIAGNOSIS — K5909 Other constipation: Secondary | ICD-10-CM | POA: Diagnosis not present

## 2023-05-19 DIAGNOSIS — J3089 Other allergic rhinitis: Secondary | ICD-10-CM | POA: Diagnosis not present

## 2023-05-19 DIAGNOSIS — M4306 Spondylolysis, lumbar region: Secondary | ICD-10-CM | POA: Diagnosis not present

## 2023-05-19 DIAGNOSIS — E559 Vitamin D deficiency, unspecified: Secondary | ICD-10-CM

## 2023-05-19 MED ORDER — MONTELUKAST SODIUM 10 MG PO TABS
10.0000 mg | ORAL_TABLET | Freq: Every day | ORAL | 3 refills | Status: DC
Start: 2023-05-19 — End: 2023-09-22

## 2023-05-19 NOTE — Patient Instructions (Addendum)
I appreciate the opportunity to provide care to you today!    Follow up:  3 months  Labs: please stop by the lab during the week to get your blood drawn (CBC, CMP, TSH, Lipid profile, HgA1c, Vit D)   Low sodium diet ,daily elevation of 20-30 minutes above level of heart, daily compression stocking use, exercise,refraining from prolonged sitting or standing.     Healthy Tips for Weight loss Eat three meals per day at times discussed. Cut out all diet bevergages and drink only water Eat whole food plant based meals Cut out junk food, fast food and processed foods Exercise 150 minutes a week Lose 1-2 lbs per week. Keep a food journal Choose foods that grow in a garden or in a fruit orchard and protein of animals with fins or feathers.  Lifestyle Medicine  - Whole Food, Plant Predominant Nutrition is highly recommended: Eat Plenty of vegetables, Mushrooms, fruits, Legumes, Whole Grains, Nuts, seeds in lieu of processed meats, processed snacks/pastries red meat, poultry, eggs.   -It is better to avoid simple carbohydrates including: Cakes, Sweet Desserts, Ice Cream, Soda (diet and regular), Sweet Tea, Candies, Chips, Cookies, Store Bought Juices, Alcohol in Excess of  1-2 drinks a day, Lemonade,  Artificial Sweeteners, Doughnuts, Coffee Creamers, "Sugar-free" Products, etc, etc.  This is not a complete list..... Exercise: If you are able: 30 -60 minutes a day ,4 days a week, or 150 minutes a week.  The longer the better.  Combine stretch, strength, and aerobic activities.  If you were told in the past that you have high risk for cardiovascular diseases, you may seek evaluation by your heart doctor prior to initiating moderate to intense exercise programs.      Constipation Eat foods that have a lot of fiber. Good choices are fruits, vegetables, prune juice, and cereal  ?Drink plenty of water and other fluids at least 64 ounces . The recommended amount of fiber is 20 to 35 grams a day. The  nutrition label on packaged foods can show you how much fiber you are getting in each serving  If you can't get enough fiber from food, you can add wheat bran to the foods you do eat. Or you can take fiber supplements. These come in the form of powders, wafers, or pills. They include psyllium seed (sample brand names: Metamucil, Konsyl), methylcellulose (sample brand name: Citrucel), polycarbophil (sample brand name: FiberCon), and wheat dextrin (sample brand name: Benefiber). If you take a fiber supplement, be sure to read the label so you know how much to take. If you're not sure, ask your doctor or nurse.  Attached with your AVS, you will find valuable resources for self-education. I highly recommend dedicating some time to thoroughly examine them.   Please continue to a heart-healthy diet and increase your physical activities. Try to exercise for at least five days a week.    It was a pleasure to see you and I look forward to continuing to work together on your health and well-being. Please do not hesitate to call the office if you need care or have questions about your care.  In case of emergency, please visit the Emergency Department for urgent care, or contact our clinic at [phone] to schedule an appointment. We're here to help you!   Have a wonderful day and week. With Gratitude, Gilmore Laroche MSN, FNP-BC

## 2023-05-19 NOTE — Progress Notes (Signed)
Established Patient Office Visit  Subjective:  Patient ID: Kristina Delacruz, female    DOB: [birth year]  Age: 43 y.o. MRN: [id number]  CC:  Chief Complaint  Patient presents with   Care Management    3 month f/u. Reports swelling in her legs and feet and numbness. Would like to discuss weight, would like to lose weight.   Constipation    Pt reports still struggling with constipation.     HPI Kristina Delacruz is a 42 y.o. female with past medical history of high blood pressure, obesity, and depression presents for f/u of  chronic medical conditions. For the details of today's visit, please refer to the assessment and plan.     Wt Readings from Last 3 Encounters:  06/23/23 167 lb 8 oz (76 kg)  06/19/23 165 lb (74.8 kg)  06/10/23 165 lb 1.9 oz (74.9 kg)     Past Medical History:  Diagnosis Date   Aortic valve insufficiency    Arthritis    GERD (gastroesophageal reflux disease)    Mitral valve disorder    Stress fracture of cervical vertebra    Trichimoniasis 08/13/2021   08/13/21 treated with flagyl.    Past Surgical History:  Procedure Laterality Date   DILATATION AND CURETTAGE/HYSTEROSCOPY WITH MINERVA N/A 04/24/2022   Procedure: DILATATION AND CURETTAGE/HYSTEROSCOPY WITH MINERVA;  Surgeon: Lazaro Arms, MD;  Location: [facility];  Service: Gynecology;  Laterality: N/A;   ESSURE TUBAL LIGATION Bilateral    LAPAROSCOPIC BILATERAL SALPINGECTOMY N/A 04/24/2022   Procedure: LAPAROSCOPIC BILATERAL SALPINGECTOMY; removal foreign object;  Surgeon: Lazaro Arms, MD;  Location: [facility];  Service: Gynecology;  Laterality: N/A;   TUBAL LIGATION      Family History  Problem Relation Age of Onset   Thyroid disease Mother    Hypertension Father        AVR   Heart murmur Father    Hypercholesterolemia Sister    Thyroid disease Sister    Diabetes type I Daughter    Thyroid disease Paternal Aunt    Thyroid disease Paternal Uncle    Hypertension Maternal Grandmother    Colon cancer  Paternal Grandfather    Breast cancer Neg Hx    Cervical cancer Neg Hx     Social History   Socioeconomic History   Marital status: Single    Spouse name: Not on file   Number of children: 1   Years of education: Not on file   Highest education level: Not on file  Occupational History   Not on file  Tobacco Use   Smoking status: Former    Types: Cigarettes   Smokeless tobacco: Never  Vaping Use   Vaping status: Never Used  Substance and Sexual Activity   Alcohol use: Not Currently   Drug use: No   Sexual activity: Not Currently    Birth control/protection: Surgical    Comment: tubal & ablation  Other Topics Concern   Not on file  Social History Narrative   Lives home alone   Social Determinants of Health   Financial Resource Strain: Low Risk  (08/08/2021)   Overall Financial Resource Strain (CARDIA)    Difficulty of Paying Living Expenses: Not very hard  Food Insecurity: No Food Insecurity (08/08/2021)   Hunger Vital Sign    Worried About Running Out of Food in the Last Year: Never true    Ran Out of Food in the Last Year: Never true  Transportation Needs: No Transportation Needs (08/08/2021)  PRAPARE - Administrator, Civil Service (Medical): No    Lack of Transportation (Non-Medical): No  Physical Activity: Sufficiently Active (08/08/2021)   Exercise Vital Sign    Days of Exercise per Week: 6 days    Minutes of Exercise per Session: 30 min  Stress: No Stress Concern Present (08/08/2021)   [employer] of Occupational Health - Occupational Stress Questionnaire    Feeling of Stress : Only a little  Social Connections: Moderately Isolated (08/08/2021)   Social Connection and Isolation Panel [NHANES]    Frequency of Communication with Friends and Family: More than three times a week    Frequency of Social Gatherings with Friends and Family: Three times a week    Attends Religious Services: More than 4 times per year    Active Member of Clubs or  Organizations: No    Attends Banker Meetings: Never    Marital Status: Divorced  Catering manager Violence: Not At Risk (08/08/2021)   Humiliation, Afraid, Rape, and Kick questionnaire    Fear of Current or Ex-Partner: No    Emotionally Abused: No    Physically Abused: No    Sexually Abused: No    Outpatient Medications Prior to Visit  Medication Sig Dispense Refill   Cetirizine HCl (ZYRTEC PO) Take by mouth.     Drospirenone (SLYND) 4 MG TABS Take 1 tablet (4 mg total) by mouth daily. 28 tablet 12   Melatonin 5 MG CHEW Chew by mouth.     methocarbamol (ROBAXIN) 500 MG tablet Take 1 tablet (500 mg total) by mouth every 6 (six) hours as needed for muscle spasms. 60 tablet 1   pantoprazole (PROTONIX) 40 MG tablet Take 1 tablet (40 mg total) by mouth as needed. 90 tablet 1   traZODone (DESYREL) 150 MG tablet TAKE 1 TABLET BY MOUTH AT BEDTIME. 90 tablet 2   fexofenadine (ALLEGRA) 180 MG tablet Take 180 mg by mouth in the morning.     gabapentin (NEURONTIN) 100 MG capsule 2 at night 90 capsule 2   meloxicam (MOBIC) 7.5 MG tablet Take 1 tablet (7.5 mg total) by mouth daily. 60 tablet 1   montelukast (SINGULAIR) 10 MG tablet Take 1 tablet (10 mg total) by mouth at bedtime. 30 tablet 3   omeprazole (PRILOSEC) 40 MG capsule      No facility-administered medications prior to visit.    Allergies  Allergen Reactions   Bee Venom Swelling   Onion Rash   Tape Rash    ROS Review of Systems  Constitutional:  Negative for chills and fever.  Eyes:  Negative for visual disturbance.  Respiratory:  Negative for chest tightness and shortness of breath.   Gastrointestinal:  Positive for constipation.  Musculoskeletal:        Leg swelling  Neurological:  Negative for dizziness and headaches.      Objective:    Physical Exam HENT:     Head: Normocephalic.     Mouth/Throat:     Mouth: Mucous membranes are moist.  Cardiovascular:     Rate and Rhythm: Normal rate.     Heart  sounds: Normal heart sounds.  Pulmonary:     Effort: Pulmonary effort is normal.     Breath sounds: Normal breath sounds.  Musculoskeletal:     Right lower leg: No edema.     Left lower leg: No edema.  Neurological:     Mental Status: She is alert.     BP 130/82  Pulse 85   Ht 5\' 2"  (1.575 m)   Wt 168 lb 1.9 oz (76.3 kg)   SpO2 98%   BMI 30.75 kg/m  Wt Readings from Last 3 Encounters:  06/23/23 167 lb 8 oz (76 kg)  06/19/23 165 lb (74.8 kg)  06/10/23 165 lb 1.9 oz (74.9 kg)    Lab Results  Component Value Date   TSH 4.520 (H) 05/23/2023   Lab Results  Component Value Date   WBC 5.6 05/23/2023   HGB 14.4 05/23/2023   HCT 41.2 05/23/2023   MCV 86 05/23/2023   PLT 255 05/23/2023   Lab Results  Component Value Date   NA 138 05/23/2023   K 3.8 05/23/2023   CO2 18 (L) 05/23/2023   GLUCOSE 93 05/23/2023   BUN 11 05/23/2023   CREATININE 0.85 05/23/2023   BILITOT 0.4 05/23/2023   ALKPHOS 72 05/23/2023   AST 22 05/23/2023   ALT 16 05/23/2023   PROT 6.8 05/23/2023   ALBUMIN 4.2 05/23/2023   CALCIUM 9.4 05/23/2023   ANIONGAP 7 04/24/2022   EGFR 88 05/23/2023   Lab Results  Component Value Date   CHOL 189 05/23/2023   Lab Results  Component Value Date   HDL 52 05/23/2023   Lab Results  Component Value Date   LDLCALC 120 (H) 05/23/2023   Lab Results  Component Value Date   TRIG 92 05/23/2023   Lab Results  Component Value Date   CHOLHDL 3.6 05/23/2023   Lab Results  Component Value Date   HGBA1C 5.1 05/23/2023      Assessment & Plan:  Swelling of lower extremity Assessment & Plan: No swelling noted on physical examination No cardiac symptoms reported Encouraged supportive care with a low sodium diet ,daily elevation of 20-30 minutes above level of heart, daily compression stocking use, exercise,refraining from prolonged sitting or standing   Obesity (BMI 30-39.9) Assessment & Plan:  Recommended:  Eat three meals per day at times  discussed. Cut out all diet bevergages and drink only water Eat whole food plant based meals Cut out junk food, fast food and processed foods Exercise 150 minutes a week Lose 1-2 lbs per week. Keep a food journal Choose foods that grow in a garden or in a fruit orchard and protein of animals with fins or feathers.  Lifestyle Medicine  - Whole Food, Plant Predominant Nutrition is highly recommended: Eat Plenty of vegetables, Mushrooms, fruits, Legumes, Whole Grains, Nuts, seeds in lieu of processed meats, processed snacks/pastries red meat, poultry, eggs.   -It is better to avoid simple carbohydrates including: Cakes, Sweet Desserts, Ice Cream, Soda (diet and regular), Sweet Tea, Candies, Chips, Cookies, Store Bought Juices, Alcohol in Excess of  1-2 drinks a day, Lemonade,  Artificial Sweeteners, Doughnuts, Coffee Creamers, "Sugar-free" Products, etc, etc.  This is not a complete list..... Exercise: If you are able: 30 -60 minutes a day ,4 days a week, or 150 minutes a week.  The longer the better.  Combine stretch, strength, and aerobic activities.  If you were told in the past that you have high risk for cardiovascular diseases, you may seek evaluation by your heart doctor prior to initiating moderate to intense exercise programs.      Other constipation Assessment & Plan: Encouraged supportive care Encouraged to eat foods that have a lot of fiber. Good choices are fruits, vegetables, prune juice, and cereal  Drink plenty of water and other fluids at least 64 ounces . The recommended amount of fiber  is 20 to 35 grams a day. The nutrition label on packaged foods can show you how much fiber you are getting in each serving   Chronic hip pain, bilateral  Lumbar pain  Spondylolysis, lumbar region  Perennial allergic rhinitis -     Montelukast Sodium; Take 1 tablet (10 mg total) by mouth at bedtime.  Dispense: 30 tablet; Refill: 3  IFG (impaired fasting glucose) -     Hemoglobin  A1c  Vitamin D deficiency -     VITAMIN D 25 Hydroxy (Vit-D Deficiency, Fractures)  Need for hepatitis C screening test -     Hepatitis C antibody  Encounter for screening for HIV -     HIV Antibody (routine testing w rflx)  Other specified hypothyroidism -     TSH + free T4  Other hyperlipidemia -     Lipid panel -     CMP14+EGFR -     CBC with Differential/Platelet  Note: This chart has been completed using Engineer, civil (consulting) software, and while attempts have been made to ensure accuracy, certain words and phrases may not be transcribed as intended.    Follow-up: Return in about 3 months (around 08/19/2023).   Gilmore Laroche, FNP

## 2023-05-23 ENCOUNTER — Ambulatory Visit (INDEPENDENT_AMBULATORY_CARE_PROVIDER_SITE_OTHER): Payer: 59 | Admitting: Orthopedic Surgery

## 2023-05-23 ENCOUNTER — Encounter: Payer: Self-pay | Admitting: Orthopedic Surgery

## 2023-05-23 VITALS — BP 141/108 | HR 120 | Ht 62.0 in | Wt 167.0 lb

## 2023-05-23 DIAGNOSIS — Z1159 Encounter for screening for other viral diseases: Secondary | ICD-10-CM | POA: Diagnosis not present

## 2023-05-23 DIAGNOSIS — E7849 Other hyperlipidemia: Secondary | ICD-10-CM | POA: Diagnosis not present

## 2023-05-23 DIAGNOSIS — E038 Other specified hypothyroidism: Secondary | ICD-10-CM | POA: Diagnosis not present

## 2023-05-23 DIAGNOSIS — E559 Vitamin D deficiency, unspecified: Secondary | ICD-10-CM | POA: Diagnosis not present

## 2023-05-23 DIAGNOSIS — R7301 Impaired fasting glucose: Secondary | ICD-10-CM | POA: Diagnosis not present

## 2023-05-23 DIAGNOSIS — Z114 Encounter for screening for human immunodeficiency virus [HIV]: Secondary | ICD-10-CM | POA: Diagnosis not present

## 2023-05-23 DIAGNOSIS — M47816 Spondylosis without myelopathy or radiculopathy, lumbar region: Secondary | ICD-10-CM | POA: Diagnosis not present

## 2023-05-23 NOTE — Progress Notes (Signed)
Chief Complaint  Patient presents with   Back Pain    Follow up MRI back  her PC stopped her Meloxicam ands stated due to her cardiac issues  and she is having cramps bilat legs   42 year old presents for follow-up after failing nonoperative treatment.  No improvement in leg pain   She was treated with meloxicam, physical therapy, gabapentin, Robaxin.  After failing nonoperative treatment she had an MRI done.  My interpretation of the MRI which was read by radiologist:  Nonoperative disease in the spine.  Foraminal stenosis.  This most likely accounts of the patient's symptoms but I do not think she needs surgery.  However, I have exhausted nonoperative measures at this time and will refer her to and Ortho for definitive management  I have reviewed the findings with her with a model    Mri report  IMPRESSION: 1. 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 2. No canal stenosis at any level. 3. 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     Electronically Signed   By: Duanne Guess D.O.   On: 05/22/2023 16:00

## 2023-05-23 NOTE — Addendum Note (Signed)
Addended by: Michaele Offer on: 05/23/2023 03:24 PM   Modules accepted: Orders

## 2023-05-24 LAB — CBC WITH DIFFERENTIAL/PLATELET
Basophils Absolute: 0 10*3/uL (ref 0.0–0.2)
Basos: 1 %
EOS (ABSOLUTE): 0.1 10*3/uL (ref 0.0–0.4)
Eos: 2 %
Hematocrit: 41.2 % (ref 34.0–46.6)
Hemoglobin: 14.4 g/dL (ref 11.1–15.9)
Immature Grans (Abs): 0 10*3/uL (ref 0.0–0.1)
Immature Granulocytes: 0 %
Lymphocytes Absolute: 1.5 10*3/uL (ref 0.7–3.1)
Lymphs: 27 %
MCH: 30.1 pg (ref 26.6–33.0)
MCHC: 35 g/dL (ref 31.5–35.7)
MCV: 86 fL (ref 79–97)
Monocytes Absolute: 0.4 10*3/uL (ref 0.1–0.9)
Monocytes: 7 %
Neutrophils Absolute: 3.5 10*3/uL (ref 1.4–7.0)
Neutrophils: 63 %
Platelets: 255 10*3/uL (ref 150–450)
RBC: 4.79 x10E6/uL (ref 3.77–5.28)
RDW: 12.6 % (ref 11.7–15.4)
WBC: 5.6 10*3/uL (ref 3.4–10.8)

## 2023-05-24 LAB — CMP14+EGFR
ALT: 16 IU/L (ref 0–32)
AST: 22 IU/L (ref 0–40)
Albumin: 4.2 g/dL (ref 3.9–4.9)
Alkaline Phosphatase: 72 IU/L (ref 44–121)
BUN/Creatinine Ratio: 13 (ref 9–23)
BUN: 11 mg/dL (ref 6–24)
Bilirubin Total: 0.4 mg/dL (ref 0.0–1.2)
CO2: 18 mmol/L — ABNORMAL LOW (ref 20–29)
Calcium: 9.4 mg/dL (ref 8.7–10.2)
Chloride: 102 mmol/L (ref 96–106)
Creatinine, Ser: 0.85 mg/dL (ref 0.57–1.00)
Globulin, Total: 2.6 g/dL (ref 1.5–4.5)
Glucose: 93 mg/dL (ref 70–99)
Potassium: 3.8 mmol/L (ref 3.5–5.2)
Sodium: 138 mmol/L (ref 134–144)
Total Protein: 6.8 g/dL (ref 6.0–8.5)
eGFR: 88 mL/min/{1.73_m2} (ref >59)

## 2023-05-24 LAB — LIPID PANEL
Chol/HDL Ratio: 3.6 ratio (ref 0.0–4.4)
Cholesterol, Total: 189 mg/dL (ref 100–199)
HDL: 52 mg/dL (ref >39)
LDL Chol Calc (NIH): 120 mg/dL — ABNORMAL HIGH (ref 0–99)
Triglycerides: 92 mg/dL (ref 0–149)
VLDL Cholesterol Cal: 17 mg/dL (ref 5–40)

## 2023-05-24 LAB — TSH+FREE T4
Free T4: 1.14 ng/dL (ref 0.82–1.77)
TSH: 4.52 u[IU]/mL — ABNORMAL HIGH (ref 0.450–4.500)

## 2023-05-24 LAB — VITAMIN D 25 HYDROXY (VIT D DEFICIENCY, FRACTURES): Vit D, 25-Hydroxy: 44.2 ng/mL (ref 30.0–100.0)

## 2023-05-24 LAB — HEPATITIS C ANTIBODY: Hep C Virus Ab: NONREACTIVE

## 2023-05-24 LAB — HEMOGLOBIN A1C
Est. average glucose Bld gHb Est-mCnc: 100 mg/dL
Hgb A1c MFr Bld: 5.1 % (ref 4.8–5.6)

## 2023-05-24 LAB — HIV ANTIBODY (ROUTINE TESTING W REFLEX): HIV Screen 4th Generation wRfx: NONREACTIVE

## 2023-05-24 NOTE — Progress Notes (Signed)
Your cholesterol levels are elevated. I want your LDL to be less than 100. I recommend avoiding simple carbohydrates including cakes, sweet desserts, ice cream, soda (diet or regular), sweet tea, candies, chips, cookies, store-bought juices, alcohol in excess of 1-2 drinks a day, lemonade, artificial sweeteners, donuts, coffee creamers, and sugar-free products.  I recommend avoiding greasy, fatty foods with increased physical activity. Your thyroid, kidneys, and liver function are stable.

## 2023-05-26 ENCOUNTER — Other Ambulatory Visit: Payer: Self-pay | Admitting: Family Medicine

## 2023-05-26 DIAGNOSIS — E038 Other specified hypothyroidism: Secondary | ICD-10-CM

## 2023-05-26 NOTE — Progress Notes (Signed)
Spoke and informed the patient to return for labs in 6 weeks to assess her thyroid levels.

## 2023-06-01 ENCOUNTER — Other Ambulatory Visit: Payer: Self-pay | Admitting: Orthopedic Surgery

## 2023-06-01 DIAGNOSIS — M545 Low back pain, unspecified: Secondary | ICD-10-CM

## 2023-06-01 DIAGNOSIS — M4306 Spondylolysis, lumbar region: Secondary | ICD-10-CM

## 2023-06-10 ENCOUNTER — Encounter: Payer: Self-pay | Admitting: Family Medicine

## 2023-06-10 ENCOUNTER — Ambulatory Visit (INDEPENDENT_AMBULATORY_CARE_PROVIDER_SITE_OTHER): Payer: 59 | Admitting: Family Medicine

## 2023-06-10 VITALS — BP 136/80 | HR 105 | Ht 62.0 in | Wt 165.1 lb

## 2023-06-10 DIAGNOSIS — L219 Seborrheic dermatitis, unspecified: Secondary | ICD-10-CM

## 2023-06-10 MED ORDER — KETOCONAZOLE 2 % EX SHAM
1.0000 | MEDICATED_SHAMPOO | CUTANEOUS | 0 refills | Status: DC
Start: 2023-06-12 — End: 2023-11-22

## 2023-06-10 NOTE — Patient Instructions (Addendum)
I appreciate the opportunity to provide care to you today!    Follow up:  08/22/2023     Please continue to a heart-healthy diet and increase your physical activities. Try to exercise for at least five days a week.    It was a pleasure to see you and I look forward to continuing to work together on your health and well-being. Please do not hesitate to call the office if you need care or have questions about your care.  In case of emergency, please visit the Emergency Department for urgent care, or contact our clinic at [phone] to schedule an appointment. We're here to help you!   Have a wonderful day and week. With Gratitude, Gilmore Laroche MSN, FNP-BC

## 2023-06-10 NOTE — Progress Notes (Signed)
Established Patient Office Visit  Subjective:  Patient ID: Kristina Delacruz, female    DOB: Aug 25, 1981  Age: 42 y.o. MRN: [id number]  CC:  Chief Complaint  Patient presents with   Hair/Scalp Problem    Pt reports itchy scalp, ongoing for a while.     HPI Kristina Delacruz is a 42 y.o. female  presents  with c/o itchy flaky scalp.  For the details of today's visit, please refer to the assessment and plan.     Past Medical History:  Diagnosis Date   GERD (gastroesophageal reflux disease)    Stress fracture of cervical vertebra    Trichimoniasis 08/13/2021   08/13/21 treated with flagyl.    Past Surgical History:  Procedure Laterality Date   DILATATION AND CURETTAGE/HYSTEROSCOPY WITH MINERVA N/A 04/24/2022   Procedure: DILATATION AND CURETTAGE/HYSTEROSCOPY WITH MINERVA;  Surgeon: Lazaro Arms, MD;  Location: [facility];  Service: Gynecology;  Laterality: N/A;   ESSURE TUBAL LIGATION Bilateral    LAPAROSCOPIC BILATERAL SALPINGECTOMY N/A 04/24/2022   Procedure: LAPAROSCOPIC BILATERAL SALPINGECTOMY; removal foreign object;  Surgeon: Lazaro Arms, MD;  Location: [facility];  Service: Gynecology;  Laterality: N/A;   TUBAL LIGATION      Family History  Problem Relation Age of Onset   Thyroid disease Mother    Hypertension Father        AVR   Heart murmur Father    Hypercholesterolemia Sister    Thyroid disease Sister    Diabetes type I Daughter    Thyroid disease Paternal Aunt    Thyroid disease Paternal Uncle    Hypertension Maternal Grandmother    Colon cancer Paternal Grandfather    Breast cancer Neg Hx    Cervical cancer Neg Hx     Social History   Socioeconomic History   Marital status: Single    Spouse name: Not on file   Number of children: 1   Years of education: Not on file   Highest education level: Not on file  Occupational History   Not on file  Tobacco Use   Smoking status: Former    Types: Cigarettes   Smokeless tobacco: Never  Vaping Use   Vaping  status: Never Used  Substance and Sexual Activity   Alcohol use: Not Currently   Drug use: No   Sexual activity: Not Currently    Birth control/protection: Surgical    Comment: tubal & ablation  Other Topics Concern   Not on file  Social History Narrative   Lives home alone   Social Determinants of Health   Financial Resource Strain: Low Risk  (08/08/2021)   Overall Financial Resource Strain (CARDIA)    Difficulty of Paying Living Expenses: Not very hard  Food Insecurity: No Food Insecurity (08/08/2021)   Hunger Vital Sign    Worried About Running Out of Food in the Last Year: Never true    Ran Out of Food in the Last Year: Never true  Transportation Needs: No Transportation Needs (08/08/2021)   PRAPARE - Administrator, Civil Service (Medical): No    Lack of Transportation (Non-Medical): No  Physical Activity: Sufficiently Active (08/08/2021)   Exercise Vital Sign    Days of Exercise per Week: 6 days    Minutes of Exercise per Session: 30 min  Stress: No Stress Concern Present (08/08/2021)   [employer] of Occupational Health - Occupational Stress Questionnaire    Feeling of Stress : Only a little  Social Connections: Moderately Isolated (08/08/2021)  Social Connection and Isolation Panel [NHANES]    Frequency of Communication with Friends and Family: More than three times a week    Frequency of Social Gatherings with Friends and Family: Three times a week    Attends Religious Services: More than 4 times per year    Active Member of Clubs or Organizations: No    Attends Banker Meetings: Never    Marital Status: Divorced  Catering manager Violence: Not At Risk (08/08/2021)   Humiliation, Afraid, Rape, and Kick questionnaire    Fear of Current or Ex-Partner: No    Emotionally Abused: No    Physically Abused: No    Sexually Abused: No    Outpatient Medications Prior to Visit  Medication Sig Dispense Refill   Cetirizine HCl (ZYRTEC PO) Take by  mouth.     Drospirenone (SLYND) 4 MG TABS Take 1 tablet (4 mg total) by mouth daily. 28 tablet 12   fexofenadine (ALLEGRA) 180 MG tablet Take 180 mg by mouth in the morning.     gabapentin (NEURONTIN) 100 MG capsule TAKE 2 CAPSULES BY MOUTH AT NIGHT 60 capsule 4   Melatonin 5 MG CHEW Chew by mouth.     meloxicam (MOBIC) 7.5 MG tablet Take 1 tablet (7.5 mg total) by mouth daily. 60 tablet 1   methocarbamol (ROBAXIN) 500 MG tablet Take 1 tablet (500 mg total) by mouth every 6 (six) hours as needed for muscle spasms. 60 tablet 1   montelukast (SINGULAIR) 10 MG tablet Take 1 tablet (10 mg total) by mouth at bedtime. 30 tablet 3   omeprazole (PRILOSEC) 40 MG capsule      pantoprazole (PROTONIX) 40 MG tablet Take 1 tablet (40 mg total) by mouth as needed. 90 tablet 1   traZODone (DESYREL) 150 MG tablet TAKE 1 TABLET BY MOUTH AT BEDTIME. 90 tablet 2   No facility-administered medications prior to visit.    Allergies  Allergen Reactions   Bee Venom Swelling   Onion Rash   Tape Rash    ROS Review of Systems  Constitutional:  Negative for chills and fever.  Eyes:  Negative for visual disturbance.  Respiratory:  Negative for chest tightness and shortness of breath.   Neurological:  Negative for dizziness and headaches.      Objective:    Physical Exam HENT:     Head: Normocephalic.     Mouth/Throat:     Mouth: Mucous membranes are moist.  Cardiovascular:     Rate and Rhythm: Normal rate.     Heart sounds: Normal heart sounds.  Pulmonary:     Effort: Pulmonary effort is normal.     Breath sounds: Normal breath sounds.  Neurological:     Mental Status: She is alert.     BP 136/80   Pulse (!) 105   Ht 5\' 2"  (1.575 m)   Wt 165 lb 1.9 oz (74.9 kg)   SpO2 98%   BMI 30.20 kg/m  Wt Readings from Last 3 Encounters:  06/10/23 165 lb 1.9 oz (74.9 kg)  05/23/23 167 lb (75.8 kg)  05/19/23 168 lb 1.9 oz (76.3 kg)    Lab Results  Component Value Date   TSH 4.520 (H) 05/23/2023    Lab Results  Component Value Date   WBC 5.6 05/23/2023   HGB 14.4 05/23/2023   HCT 41.2 05/23/2023   MCV 86 05/23/2023   PLT 255 05/23/2023   Lab Results  Component Value Date   NA 138 05/23/2023  K 3.8 05/23/2023   CO2 18 (L) 05/23/2023   GLUCOSE 93 05/23/2023   BUN 11 05/23/2023   CREATININE 0.85 05/23/2023   BILITOT 0.4 05/23/2023   ALKPHOS 72 05/23/2023   AST 22 05/23/2023   ALT 16 05/23/2023   PROT 6.8 05/23/2023   ALBUMIN 4.2 05/23/2023   CALCIUM 9.4 05/23/2023   ANIONGAP 7 04/24/2022   EGFR 88 05/23/2023   Lab Results  Component Value Date   CHOL 189 05/23/2023   Lab Results  Component Value Date   HDL 52 05/23/2023   Lab Results  Component Value Date   LDLCALC 120 (H) 05/23/2023   Lab Results  Component Value Date   TRIG 92 05/23/2023   Lab Results  Component Value Date   CHOLHDL 3.6 05/23/2023   Lab Results  Component Value Date   HGBA1C 5.1 05/23/2023      Assessment & Plan:  Seborrheic dermatitis of scalp Assessment & Plan: With treat today with ketoconazole shampoo RX sent to the pharmacy No redness or signs of inflammation noted on the scalp\ Noted flaky white scalp   Orders: -     Ketoconazole; Apply 1 Application topically 2 (two) times a week.  Dispense: 120 mL; Refill: 0  Note: This chart has been completed using Engineer, civil (consulting) software, and while attempts have been made to ensure accuracy, certain words and phrases may not be transcribed as intended.    Follow-up: No follow-ups on file.   Gilmore Laroche, FNP

## 2023-06-12 DIAGNOSIS — Z419 Encounter for procedure for purposes other than remedying health state, unspecified: Secondary | ICD-10-CM | POA: Diagnosis not present

## 2023-06-13 DIAGNOSIS — L219 Seborrheic dermatitis, unspecified: Secondary | ICD-10-CM | POA: Insufficient documentation

## 2023-06-13 NOTE — Assessment & Plan Note (Addendum)
With treat today with ketoconazole shampoo RX sent to the pharmacy No redness or signs of inflammation noted on the scalp\ Noted flaky white scalp

## 2023-06-19 ENCOUNTER — Other Ambulatory Visit (INDEPENDENT_AMBULATORY_CARE_PROVIDER_SITE_OTHER): Payer: 59

## 2023-06-19 ENCOUNTER — Ambulatory Visit (INDEPENDENT_AMBULATORY_CARE_PROVIDER_SITE_OTHER): Payer: 59 | Admitting: Orthopedic Surgery

## 2023-06-19 ENCOUNTER — Encounter: Payer: Self-pay | Admitting: Orthopedic Surgery

## 2023-06-19 VITALS — BP 127/89 | HR 88 | Ht 62.0 in | Wt 165.0 lb

## 2023-06-19 DIAGNOSIS — G8929 Other chronic pain: Secondary | ICD-10-CM | POA: Diagnosis not present

## 2023-06-19 DIAGNOSIS — M5442 Lumbago with sciatica, left side: Secondary | ICD-10-CM

## 2023-06-19 DIAGNOSIS — M47816 Spondylosis without myelopathy or radiculopathy, lumbar region: Secondary | ICD-10-CM

## 2023-06-19 DIAGNOSIS — M545 Low back pain, unspecified: Secondary | ICD-10-CM

## 2023-06-19 NOTE — Progress Notes (Signed)
Orthopedic Spine Surgery Office Note  Assessment: Patient is a 42 y.o. female with low back pain. No radicular symptoms. Has positive SI joint physical exam findings   Plan: -Explained that initially conservative treatment is tried as a significant number of patients may experience relief with these treatment modalities. Discussed that the conservative treatments include:  -activity modification  -physical therapy  -over the counter pain medications  -medrol dosepak  -lumbar steroid injections -Patient has tried PT, meloxicam, Tylenol, gabapentin -Recommended diagnostic therapeutic SI joint ejections -Explained that she also has significant tenderness to even superficial palpation so discussed desensitization with her -Patient should return to office in 4 weeks, x-rays at next visit: none   Patient expressed understanding of the plan and all questions were answered to the patient's satisfaction.   ___________________________________________________________________________   History:  Patient is a 42 y.o. female who presents today for lumbar spine.  Patient has had about 5 months of low back pain.  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  She feels it in her lower back.  She feels on a daily basis.  She notes it is worse if she is particular active or is laying flat.  It sometimes radiates into the right hip and then at times she has pain in her right heel but that does not radiate down to the level of the foot.  She has no pain rating into her left lower extremity.   Weakness: Denies Symptoms of imbalance: Denies Paresthesias and numbness: Denies Bowel or bladder incontinence: Denies Saddle anesthesia: Denies  Treatments tried: PT, Tylenol, meloxicam, gabapentin  Review of systems: Denies fevers and chills, night sweats, unexplained weight loss, history of cancer.  Has had pain that wakes her at night  Past medical history: GERD  Allergies:  adhesive tape  Past surgical history:  D&C of uterus Tubal ligation  Social history: Denies use of nicotine product (smoking, vaping, patches, smokeless) Alcohol use: denies Denies recreational drug use   Physical Exam:  BMI of 30.2  General: no acute distress, appears stated age Neurologic: alert, answering questions appropriately, following commands Respiratory: unlabored breathing on room air, symmetric chest rise Psychiatric: appropriate affect, normal cadence to speech   MSK (spine):  -Strength exam      Left  Right EHL    5/5  5/5 TA    5/5  5/5 GSC    5/5  5/5 Knee extension  5/5  5/5 Hip flexion   5/5  5/5  -Sensory exam    Sensation intact to light touch in L3-S1 nerve distributions of bilateral lower extremities  -Achilles DTR: 2/4 on the left, 2/4 on the right -Patellar tendon DTR: 2/4 on the left, 2/4 on the right  -Straight leg raise: negative bilaterally -Femoral nerve stretch test: negative bilaterally -Clonus: no beats bilaterally -Significant tenderness to even light/superficial palpation over the lumbar spine and SI joints, groins and grimaces with this palpation  -Bilateral hip exam: TTP over the SI joint, positive SI joint compression test, positive Faber, positive SI joint distraction test, no pain through hip range of motion, negative Stinchfield  Imaging: XR of the lumbar spine from 06/19/2023 was independently reviewed and interpreted, showing small amount of disc height loss at L4/5. No other significant degenerative changes. No fracture or dislocation. No evidence of instability on flexion/extension views.   MRI of the lumbar spine from 05/17/2023 was independently reviewed and interpreted, showing mild right L4/5 foraminal stenosis. No other significant stenosis. Disc  desiccation at L4/5.    Patient name: Kristina Delacruz Patient MRN: [id number] Date of visit: 06/19/23

## 2023-06-23 ENCOUNTER — Encounter: Payer: Self-pay | Admitting: Adult Health

## 2023-06-23 ENCOUNTER — Ambulatory Visit (INDEPENDENT_AMBULATORY_CARE_PROVIDER_SITE_OTHER): Payer: 59 | Admitting: Adult Health

## 2023-06-23 VITALS — BP 132/88 | HR 82 | Ht 62.0 in | Wt 167.5 lb

## 2023-06-23 DIAGNOSIS — R232 Flushing: Secondary | ICD-10-CM | POA: Diagnosis not present

## 2023-06-23 DIAGNOSIS — R1032 Left lower quadrant pain: Secondary | ICD-10-CM

## 2023-06-23 NOTE — Progress Notes (Signed)
  Subjective:     Patient ID: Kristina Delacruz, female   DOB: 1981-11-02, 42 y.o.   MRN: [id number]  HPI Kristina Delacruz is a 42 year old white female, single, G1P1001, back in follow up on taking slynd for hot flashes they were better but they are back now.   Last pap was negative HPV, NILM 08/08/21  PCP is Gilmore Laroche, NP  Review of Systems Having hot flashes again Feels like left ovary swells if spots Reviewed past medical,surgical, social and family history. Reviewed medications and allergies.     Objective:   Physical Exam BP 132/88 (BP Location: Left Arm, Patient Position: Sitting, Cuff Size: Normal)   Pulse 82   Ht 5\' 2"  (1.575 m)   Wt 167 lb 8 oz (76 kg)   BMI 30.64 kg/m     Skin warm and dry. Lungs: clear to ausculation bilaterally. Cardiovascular: regular rate and rhythm.   Upstream - 06/23/23 1546       Pregnancy Intention Screening   Does the patient want to become pregnant in the next year? No    Does the patient's partner want to become pregnant in the next year? No    Would the patient like to discuss contraceptive options today? No      Contraception Wrap Up   Current Method Female Sterilization;Oral Contraceptive    End Method Female Sterilization;Oral Contraceptive    Contraception Counseling Provided Yes             Assessment:     1. Hot flashes Still having hot flashes with slynd, has refills  Will continue for now  2. LLQ pain Feels like left ovary swells when spots   Has had normal [place] 2023 Plan:     Follow up in 2 months for ROS

## 2023-06-24 ENCOUNTER — Ambulatory Visit: Payer: 59 | Admitting: Family Medicine

## 2023-06-24 ENCOUNTER — Encounter: Payer: Self-pay | Admitting: Orthopedic Surgery

## 2023-06-24 DIAGNOSIS — M7989 Other specified soft tissue disorders: Secondary | ICD-10-CM | POA: Insufficient documentation

## 2023-06-24 NOTE — Assessment & Plan Note (Signed)
Encouraged supportive care Encouraged to eat foods that have a lot of fiber. Good choices are fruits, vegetables, prune juice, and cereal  Drink plenty of water and other fluids at least 64 ounces . The recommended amount of fiber is 20 to 35 grams a day. The nutrition label on packaged foods can show you how much fiber you are getting in each serving

## 2023-06-24 NOTE — Assessment & Plan Note (Addendum)
No swelling noted on physical examination No cardiac symptoms reported Encouraged supportive care with a low sodium diet ,daily elevation of 20-30 minutes above level of heart, daily compression stocking use, exercise,refraining from prolonged sitting or standing

## 2023-06-24 NOTE — Assessment & Plan Note (Addendum)
Recommended:  Eat three meals per day at times discussed. Cut out all diet bevergages and drink only water Eat whole food plant based meals Cut out junk food, fast food and processed foods Exercise 150 minutes a week Lose 1-2 lbs per week. Keep a food journal Choose foods that grow in a garden or in a fruit orchard and protein of animals with fins or feathers.  Lifestyle Medicine  - Whole Food, Plant Predominant Nutrition is highly recommended: Eat Plenty of vegetables, Mushrooms, fruits, Legumes, Whole Grains, Nuts, seeds in lieu of processed meats, processed snacks/pastries red meat, poultry, eggs.   -It is better to avoid simple carbohydrates including: Cakes, Sweet Desserts, Ice Cream, Soda (diet and regular), Sweet Tea, Candies, Chips, Cookies, Store Bought Juices, Alcohol in Excess of  1-2 drinks a day, Lemonade,  Artificial Sweeteners, Doughnuts, Coffee Creamers, "Sugar-free" Products, etc, etc.  This is not a complete list..... Exercise: If you are able: 30 -60 minutes a day ,4 days a week, or 150 minutes a week.  The longer the better.  Combine stretch, strength, and aerobic activities.  If you were told in the past that you have high risk for cardiovascular diseases, you may seek evaluation by your heart doctor prior to initiating moderate to intense exercise programs.

## 2023-06-27 ENCOUNTER — Ambulatory Visit (INDEPENDENT_AMBULATORY_CARE_PROVIDER_SITE_OTHER): Payer: 59 | Admitting: Sports Medicine

## 2023-06-27 ENCOUNTER — Encounter: Payer: Self-pay | Admitting: Sports Medicine

## 2023-06-27 ENCOUNTER — Other Ambulatory Visit: Payer: Self-pay

## 2023-06-27 DIAGNOSIS — G8929 Other chronic pain: Secondary | ICD-10-CM | POA: Diagnosis not present

## 2023-06-27 DIAGNOSIS — M47816 Spondylosis without myelopathy or radiculopathy, lumbar region: Secondary | ICD-10-CM | POA: Diagnosis not present

## 2023-06-27 DIAGNOSIS — M533 Sacrococcygeal disorders, not elsewhere classified: Secondary | ICD-10-CM | POA: Diagnosis not present

## 2023-06-27 DIAGNOSIS — M5442 Lumbago with sciatica, left side: Secondary | ICD-10-CM | POA: Diagnosis not present

## 2023-06-27 NOTE — Progress Notes (Signed)
Kristina Delacruz - 42 y.o. female MRN [id number]  Date of birth: 1981/01/19  Office Visit Note: Visit Date: 06/27/2023 PCP: Gilmore Laroche, FNP Referred by: Gilmore Laroche, FNP  Subjective: Chief Complaint  Patient presents with   Lower Back - Pain   HPI: Kristina Delacruz is a pleasant 42 y.o. female who presents today for low back pain with bilateral SI joint pain.  She has been having chronic low back pain.  Saw our partners up in [place], Dr. Romeo Apple, recently saw Dr. Christell Constant.  She states her back pain is rather debilitating.  She was having difficulty even walking previously, gabapentin and formalized physical therapy has helped her symptoms somewhat but still gets pain over bilateral low back.  Denies any radicular symptoms today.   Patient has tried PT, meloxicam, Tylenol, gabapentin --> currently on only Gabapentin, does find somewhat helpful. PT has been helpful as well.  Pertinent ROS were reviewed with the patient and found to be negative unless otherwise specified above in HPI.   Assessment & Plan: Visit Diagnoses:  1. Chronic right SI joint pain   2. Chronic left SI joint pain   3. Lumbar spondylosis   4. 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  Her exam today and with previous evaluation from Dr. Christell Constant does emanate more so from the SI joints.  Through shared decision-making, we did proceed with ultrasound-guided right and left SI joint injection.  Patient tolerated well.  Discussed activity modification for the next 48 hours, may use ice and/or Tylenol.  She will see over the coming weeks what degree of improvement she receives, she will follow-up with Dr. Christell Constant to review this and discuss next steps in terms of treatment.  She may continue her gabapentin 200 mg nightly in the  meantime.  Follow-up: Return for with Dr. Christell Constant as indicated .   Meds & Orders: No orders of the defined types were placed in this encounter.   Orders Placed This Encounter  Procedures   US Guided Needle Placement - No Linked Charges     Procedures: U/S-guided SI-joint injection, Right   After discussion of risk/benefits/indications, informed verbal consent was obtained. A timeout was then performed. The patient was positioned in a prone position on exam room table with a pillow placed under the pelvis for mild hip flexion. The SI joint area was cleaned and prepped with betadine and alcohol swabs. Sterile ultrasound gel was applied and the ultrasound transducer was placed in an anatomic axial plane over the PSIS, then moved distally over the SI-joint. Using ultrasound guidance, a 22-gauge, 3.5" needle was inserted from a medial to lateral approach utilizing an in-plane approach and directed into the SI-joint. The SI-joint was then injected with a mixture of 4:1 lidocaine:depomedrol with visualization of the injectate flow into the SI-joint under ultrasound visualization. The patient tolerated the procedure well without immediate complications.  U/S-guided SI-joint injection, Left   After discussion of risk/benefits/indications, informed verbal consent was obtained. A timeout was then performed. The patient was positioned in a prone position on exam room table with a pillow placed under the pelvis for mild hip flexion. The SI joint area was cleaned and prepped with betadine and alcohol swabs. Sterile ultrasound gel was applied and the ultrasound transducer was placed in an anatomic axial plane over the PSIS,  then moved distally over the SI-joint. Using ultrasound guidance, a 22-gauge, 3.5" needle was inserted from a medial to lateral approach utilizing an in-plane approach and directed into the SI-joint. The SI-joint was then injected with a mixture of 4:1 lidocaine:depomedrol with visualization of  the injectate flow into the SI-joint under ultrasound visualization. The patient tolerated the procedure well without immediate complications.       Clinical History: No specialty comments available.  She reports that she has quit smoking. Her smoking use included cigarettes. She has never used smokeless tobacco.  Recent Labs    11/12/22 0841 05/23/23 1011  HGBA1C 5.3 5.1    Objective:   Vital Signs: There were no vitals taken for this visit.  Physical Exam  Gen: Well-appearing, in no acute distress; non-toxic CV: Well-perfused. Warm.  Resp: Breathing unlabored on room air; no wheezing. Psych: Fluid speech in conversation; appropriate affect; normal thought process Neuro: Sensation intact throughout. No gross coordination deficits.   Ortho Exam - Lumbar/SI joint: Positive TTP over both right and left SI joint regions.  Positive SI joint compression bilateral.  There is some generalized tenderness to palpation of the lower lumbar spine.  Full range of motion with flexion and extension, with some worsening of pain with extension.  There is equivocal strength of bilateral lower extremities.  Imaging:  XR Lumbar Spine Complete XR of the lumbar spine from 06/19/2023 was independently reviewed and  interpreted, showing small amount of disc height loss at L4/5. No other  significant degenerative changes. No fracture or dislocation. No evidence  of instability on flexion/extension views.    Narrative & Impression  CLINICAL DATA:  Low back pain with bilateral radiculopathy   EXAM: MRI LUMBAR SPINE WITHOUT CONTRAST   TECHNIQUE: Multiplanar, multisequence MR imaging of the lumbar spine was performed. No intravenous contrast was administered.   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 Lowest well developed disc space is labeled as L5-S1.   Alignment:  Trace  retrolisthesis at L4-5.   Vertebrae: No fracture, evidence of discitis, or bone lesion. Superior endplate Schmorl's node at L2.   Conus medullaris and cauda equina: Conus extends to the L1 level. Conus and cauda equina appear normal.   Paraspinal and other soft tissues: Negative.   Disc levels:   T12-L1 through L3-L4: No significant disc protrusion, foraminal stenosis, or canal stenosis.   L4-L5: Mild disc desiccation and minimal annular disc bulge. Mild bilateral facet hypertrophy. No canal stenosis. Mild bilateral foraminal stenosis.   L5-S1: No significant disc protrusion, foraminal stenosis, or canal stenosis.   IMPRESSION: 1. 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 2. No canal stenosis at any level. 3. 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     Electronically Signed   By: Duanne Guess D.O.   On: 05/22/2023 16:00    Past Medical/Family/Surgical/Social History: Medications & Allergies reviewed per EMR, new medications updated. Patient Active Problem List   Diagnosis Date Noted   Swelling of lower extremity 06/24/2023   Seborrheic dermatitis of scalp 06/13/2023   Aortic regurgitation 04/08/2023   Chronic pain of both hips 01/13/2023   Constipation 01/13/2023   Class 1 obesity due to excess calories without serious comorbidity with body mass index (BMI) of 30.0 to 30.9 in adult 11/12/2022   Syncope and collapse 11/12/2022   Viral sinusitis 10/08/2022   Pregnancy examination or test, negative result 06/12/2022  High blood pressure 05/23/2022   Insomnia 05/23/2022   Dizziness 05/23/2022   Depression, major, single episode, moderate (HCC) 05/23/2022   GERD (gastroesophageal reflux disease) 05/23/2022   Allergies 05/23/2022   Obesity (BMI 30-39.9) 05/23/2022   Hypokalemia 05/23/2022   Loss of hearing 05/23/2022   Abdominal pain 05/23/2022   Encounter for female sterilization procedure     Encounter for annual general medical examination with abnormal findings in adult    Menometrorrhagia    Sleep disturbance 02/05/2022   LLQ pain 02/05/2022   Moody 09/18/2021   Dysmenorrhea 09/18/2021   Trichimoniasis 08/13/2021   Routine Papanicolaou smear 08/08/2021   Irregular periods 08/08/2021   Night sweats 08/08/2021   Hot flashes 08/08/2021   Allergic rhinitis 04/07/2009   Disorder of thyroid 01/30/2009   Simple goiter 01/30/2009   Past Medical History:  Diagnosis Date   Aortic valve insufficiency    Arthritis    GERD (gastroesophageal reflux disease)    Mitral valve disorder    Stress fracture of cervical vertebra    Trichimoniasis 08/13/2021   08/13/21 treated with flagyl.   Family History  Problem Relation Age of Onset   Thyroid disease Mother    Hypertension Father        AVR   Heart murmur Father    Hypercholesterolemia Sister    Thyroid disease Sister    Diabetes type I Daughter    Thyroid disease Paternal Aunt    Thyroid disease Paternal Uncle    Hypertension Maternal Grandmother    Colon cancer Paternal Grandfather    Breast cancer Neg Hx    Cervical cancer Neg Hx    Past Surgical History:  Procedure Laterality Date   DILATATION AND CURETTAGE/HYSTEROSCOPY WITH MINERVA N/A 04/24/2022   Procedure: DILATATION AND CURETTAGE/HYSTEROSCOPY WITH MINERVA;  Surgeon: Lazaro Arms, MD;  Location: [facility];  Service: Gynecology;  Laterality: N/A;   ESSURE TUBAL LIGATION Bilateral    LAPAROSCOPIC BILATERAL SALPINGECTOMY N/A 04/24/2022   Procedure: LAPAROSCOPIC BILATERAL SALPINGECTOMY; removal foreign object;  Surgeon: Lazaro Arms, MD;  Location: [facility];  Service: Gynecology;  Laterality: N/A;   TUBAL LIGATION     Social History   Occupational History   Not on file  Tobacco Use   Smoking status: Former    Types: Cigarettes   Smokeless tobacco: Never  Vaping Use   Vaping status: Never Used  Substance and Sexual Activity   Alcohol use: Not Currently    Drug use: No   Sexual activity: Not Currently    Birth control/protection: Surgical    Comment: tubal & ablation

## 2023-07-01 ENCOUNTER — Encounter: Payer: Self-pay | Admitting: Orthopedic Surgery

## 2023-07-01 DIAGNOSIS — M545 Low back pain, unspecified: Secondary | ICD-10-CM

## 2023-07-12 ENCOUNTER — Encounter: Payer: Self-pay | Admitting: Family Medicine

## 2023-07-13 DIAGNOSIS — Z419 Encounter for procedure for purposes other than remedying health state, unspecified: Secondary | ICD-10-CM | POA: Diagnosis not present

## 2023-07-17 ENCOUNTER — Ambulatory Visit: Payer: 59 | Admitting: Orthopedic Surgery

## 2023-07-28 ENCOUNTER — Telehealth: Payer: Self-pay | Admitting: Orthopedic Surgery

## 2023-07-28 ENCOUNTER — Other Ambulatory Visit: Payer: Self-pay | Admitting: Radiology

## 2023-07-28 DIAGNOSIS — M47816 Spondylosis without myelopathy or radiculopathy, lumbar region: Secondary | ICD-10-CM

## 2023-07-28 DIAGNOSIS — M545 Low back pain, unspecified: Secondary | ICD-10-CM

## 2023-07-28 DIAGNOSIS — G8929 Other chronic pain: Secondary | ICD-10-CM

## 2023-07-28 DIAGNOSIS — M4306 Spondylolysis, lumbar region: Secondary | ICD-10-CM

## 2023-07-28 NOTE — Telephone Encounter (Signed)
Pt called stating Dr Christell Constant sent a referral to a pain management but they do not accept pt insurance. Pt is asking for referral to be sent to [facility]. Pt phone number is [phone]

## 2023-07-28 NOTE — Telephone Encounter (Signed)
Put a referral in for [facility] on [address] [place], [place].

## 2023-08-05 ENCOUNTER — Telehealth: Payer: Self-pay | Admitting: Orthopedic Surgery

## 2023-08-05 NOTE — Telephone Encounter (Signed)
Patient called and requested a referral to the pain management  clinic. They are still waiting for it. CB#[phone]

## 2023-08-08 NOTE — Telephone Encounter (Signed)
I called and lmom with the phone number to [facility] for her to call  them and make appt.  Their number is [phone]

## 2023-08-12 DIAGNOSIS — Z419 Encounter for procedure for purposes other than remedying health state, unspecified: Secondary | ICD-10-CM | POA: Diagnosis not present

## 2023-08-21 ENCOUNTER — Ambulatory Visit (INDEPENDENT_AMBULATORY_CARE_PROVIDER_SITE_OTHER): Payer: 59 | Admitting: Adult Health

## 2023-08-21 ENCOUNTER — Encounter: Payer: Self-pay | Admitting: Adult Health

## 2023-08-21 VITALS — BP 136/96 | HR 103 | Ht 62.0 in | Wt 168.5 lb

## 2023-08-21 DIAGNOSIS — R4589 Other symptoms and signs involving emotional state: Secondary | ICD-10-CM | POA: Diagnosis not present

## 2023-08-21 DIAGNOSIS — R030 Elevated blood-pressure reading, without diagnosis of hypertension: Secondary | ICD-10-CM | POA: Diagnosis not present

## 2023-08-21 DIAGNOSIS — R232 Flushing: Secondary | ICD-10-CM | POA: Diagnosis not present

## 2023-08-21 MED ORDER — VENLAFAXINE HCL ER 37.5 MG PO CP24: 37.5000 mg | ORAL_CAPSULE | Freq: Every day | ORAL | 3 refills | Status: DC

## 2023-08-21 NOTE — Progress Notes (Signed)
  Subjective:     Patient ID: Kristina Delacruz, female   DOB: [birth year], 42 y.o.   MRN: [id number]  HPI Kristina Delacruz is a 42 year old white female,single, G1P1001, back in follow up on taking slynd and is still having occasional hot flash during the day, and has one about 3 am and sweats, she is moody too. Has felt dizzy at times.     Component Value Date/Time   DIAGPAP  08/08/2021 1623    - Negative for intraepithelial lesion or malignancy (NILM)   HPVHIGH Negative 08/08/2021 1623   ADEQPAP  08/08/2021 1623    Satisfactory for evaluation; transformation zone component PRESENT.   PCP is Gilmore Laroche NP  Review of Systems Occasional hot flash Sweats about  3 am +moody Dizzy at times Reviewed past medical,surgical, social and family history. Reviewed medications and allergies.     Objective:   Physical Exam BP (!) 136/96 (BP Location: Right Arm, Patient Position: Sitting, Cuff Size: Normal)   Pulse (!) 103   Ht 5\' 2"  (1.575 m)   Wt 168 lb 8 oz (76.4 kg)   BMI 30.82 kg/m     Skin warm and dry.  Lungs: clear to ausculation bilaterally. Cardiovascular: regular rate and rhythm.  Fall risk is low  Upstream - 08/21/23 0900       Pregnancy Intention Screening   Does the patient want to become pregnant in the next year? No    Does the patient's partner want to become pregnant in the next year? No    Would the patient like to discuss contraceptive options today? No      Contraception Wrap Up   Current Method Female Sterilization    End Method Female Sterilization    Contraception Counseling Provided No             Assessment:     1. Hot flashes Occasional flashes during the day and sweats about 3 am, will continue slynd, has refills and add Effexor XR 37.5 mg 1 daily to see if helps   2. Moody +moody will try effexor XR 37.5 mg 1 daily to see if helps with moods and hot flashes   Meds ordered this encounter  Medications   venlafaxine XR (EFFEXOR XR) 37.5 MG 24 hr capsule     Sig: Take 1 capsule (37.5 mg total) by mouth daily with breakfast.    Dispense:  30 capsule    Refill:  3    Order Specific Question:   Supervising Provider    Answer:   Despina Hidden, LUTHER H [[id number]]     3. Elevated BP without diagnosis of hypertension Decrease salt and sugar and follow up with PCP on BP     Plan:     Follow up in 8 weeks for ROS

## 2023-08-22 ENCOUNTER — Ambulatory Visit (INDEPENDENT_AMBULATORY_CARE_PROVIDER_SITE_OTHER): Payer: 59 | Admitting: Family Medicine

## 2023-08-22 ENCOUNTER — Encounter: Payer: Self-pay | Admitting: Family Medicine

## 2023-08-22 ENCOUNTER — Ambulatory Visit: Payer: 59 | Admitting: Family Medicine

## 2023-08-22 VITALS — BP 125/87 | HR 47 | Ht 62.0 in | Wt 167.1 lb

## 2023-08-22 DIAGNOSIS — E559 Vitamin D deficiency, unspecified: Secondary | ICD-10-CM

## 2023-08-22 DIAGNOSIS — E038 Other specified hypothyroidism: Secondary | ICD-10-CM | POA: Diagnosis not present

## 2023-08-22 DIAGNOSIS — K219 Gastro-esophageal reflux disease without esophagitis: Secondary | ICD-10-CM

## 2023-08-22 DIAGNOSIS — E7849 Other hyperlipidemia: Secondary | ICD-10-CM

## 2023-08-22 DIAGNOSIS — L239 Allergic contact dermatitis, unspecified cause: Secondary | ICD-10-CM

## 2023-08-22 DIAGNOSIS — R7301 Impaired fasting glucose: Secondary | ICD-10-CM

## 2023-08-22 MED ORDER — PANTOPRAZOLE SODIUM 40 MG PO TBEC
40.0000 mg | DELAYED_RELEASE_TABLET | Freq: Two times a day (BID) | ORAL | 0 refills | Status: DC
Start: 2023-08-22 — End: 2023-09-03

## 2023-08-22 NOTE — Patient Instructions (Addendum)
I appreciate the opportunity to provide care to you today!    Follow up:  4 months  Labs: please stop by the lab during the week to get your blood drawn (CBC,   Start taking Protonix 40 mg twice daily  For managing GERD, I recommend the following lifestyle changes:  Avoid Certain Foods and Drinks: Limit or eliminate coffee, chocolate, onions, peppermint, spicy foods, carbonated beverages, citrus fruits, tomatoes, garlic, alcohol, and fatty foods such as bacon, burgers, sausages, steak, fried foods, and dairy products.  Recommended Foods: Increase your intake of high-fiber foods including whole grain cereals, oatmeal, brown rice, root vegetables, and non-citrus fruits. Opt for high-protein foods and healthy fats such as avocados, olive oil, nuts, and seeds.   Please continue to a heart-healthy diet and increase your physical activities. Try to exercise for at least five days a week.    It was a pleasure to see you and I look forward to continuing to work together on your health and well-being. Please do not hesitate to call the office if you need care or have questions about your care.  In case of emergency, please visit the Emergency Department for urgent care, or contact our clinic at [phone] to schedule an appointment. We're here to help you!   Have a wonderful day and week. With Gratitude, Gilmore Laroche MSN, FNP-BC

## 2023-08-22 NOTE — Assessment & Plan Note (Addendum)
No rash was noted today on his skin. The patient was advised to avoid foods that may cause an allergic reaction.

## 2023-08-22 NOTE — Assessment & Plan Note (Signed)
Encouraged to take Protonix 40 mg twice daily Encouraged adherence to GERD diet For managing GERD, I recommend the following lifestyle changes:  Avoid Certain Foods and Drinks: Limit or eliminate coffee, chocolate, onions, peppermint, spicy foods, carbonated beverages, citrus fruits, tomatoes, garlic, alcohol, and fatty foods such as bacon, burgers, sausages, steak, fried foods, and dairy products.  Recommended Foods: Increase your intake of high-fiber foods including whole grain cereals, oatmeal, brown rice, root vegetables, and non-citrus fruits. Opt for high-protein foods and healthy fats such as avocados, olive oil, nuts, and seeds.

## 2023-08-22 NOTE — Progress Notes (Signed)
Established Patient Office Visit  Subjective:  Patient ID: Kristina Delacruz, female    DOB: 08-14-1981  Age: 42 y.o. MRN: [id number]  CC:  Chief Complaint  Patient presents with   Diaper Rash    F/u    HPI Kristina Delacruz is a 42 y.o. female with past medical history of GERD presents for f/u of  chronic medical conditions.  Allergic Dermatitis: The patient has no rash noted today. She reports a history of allergies to onions and experienced a rash flare-up on her back and abdomen after unknowingly consuming onions. Currently, there is no rash present on her skin.  GERD: The patient states that her symptoms are relieved with Protonix 40 mg daily.   Past Medical History:  Diagnosis Date   Aortic valve insufficiency    Arthritis    GERD (gastroesophageal reflux disease)    Mitral valve disorder    Stress fracture of cervical vertebra    Trichimoniasis 08/13/2021   08/13/21 treated with flagyl.    Past Surgical History:  Procedure Laterality Date   DILATATION AND CURETTAGE/HYSTEROSCOPY WITH MINERVA N/A 04/24/2022   Procedure: DILATATION AND CURETTAGE/HYSTEROSCOPY WITH MINERVA;  Surgeon: Lazaro Arms, MD;  Location: [facility];  Service: Gynecology;  Laterality: N/A;   ESSURE TUBAL LIGATION Bilateral    LAPAROSCOPIC BILATERAL SALPINGECTOMY N/A 04/24/2022   Procedure: LAPAROSCOPIC BILATERAL SALPINGECTOMY; removal foreign object;  Surgeon: Lazaro Arms, MD;  Location: [facility];  Service: Gynecology;  Laterality: N/A;   TUBAL LIGATION      Family History  Problem Relation Age of Onset   Thyroid disease Mother    Hypertension Father        AVR   Heart murmur Father    Hypercholesterolemia Sister    Thyroid disease Sister    Diabetes type I Daughter    Thyroid disease Paternal Aunt    Thyroid disease Paternal Uncle    Hypertension Maternal Grandmother    Colon cancer Paternal Grandfather    Breast cancer Neg Hx    Cervical cancer Neg Hx     Social History   Socioeconomic  History   Marital status: Single    Spouse name: Not on file   Number of children: 1   Years of education: Not on file   Highest education level: Not on file  Occupational History   Not on file  Tobacco Use   Smoking status: Former    Types: Cigarettes   Smokeless tobacco: Never  Vaping Use   Vaping status: Never Used  Substance and Sexual Activity   Alcohol use: Not Currently   Drug use: No   Sexual activity: Not Currently    Birth control/protection: Surgical    Comment: tubal & ablation  Other Topics Concern   Not on file  Social History Narrative   Lives home alone   Social Determinants of Health   Financial Resource Strain: Low Risk  (08/08/2021)   Overall Financial Resource Strain (CARDIA)    Difficulty of Paying Living Expenses: Not very hard  Food Insecurity: No Food Insecurity (08/08/2021)   Hunger Vital Sign    Worried About Running Out of Food in the Last Year: Never true    Ran Out of Food in the Last Year: Never true  Transportation Needs: No Transportation Needs (08/08/2021)   PRAPARE - Administrator, Civil Service (Medical): No    Lack of Transportation (Non-Medical): No  Physical Activity: Sufficiently Active (08/08/2021)   Exercise Vital Sign  Days of Exercise per Week: 6 days    Minutes of Exercise per Session: 30 min  Stress: No Stress Concern Present (08/08/2021)   [employer] of Occupational Health - Occupational Stress Questionnaire    Feeling of Stress : Only a little  Social Connections: Moderately Isolated (08/08/2021)   Social Connection and Isolation Panel [NHANES]    Frequency of Communication with Friends and Family: More than three times a week    Frequency of Social Gatherings with Friends and Family: Three times a week    Attends Religious Services: More than 4 times per year    Active Member of Clubs or Organizations: No    Attends Banker Meetings: Never    Marital Status: Divorced  Catering manager  Violence: Not At Risk (08/08/2021)   Humiliation, Afraid, Rape, and Kick questionnaire    Fear of Current or Ex-Partner: No    Emotionally Abused: No    Physically Abused: No    Sexually Abused: No    Outpatient Medications Prior to Visit  Medication Sig Dispense Refill   Bacillus Coagulans-Inulin (PROBIOTIC-PREBIOTIC PO) Take by mouth.     Cetirizine HCl (ZYRTEC PO) Take by mouth.     Drospirenone (SLYND) 4 MG TABS Take 1 tablet (4 mg total) by mouth daily. 28 tablet 12   gabapentin (NEURONTIN) 100 MG capsule TAKE 2 CAPSULES BY MOUTH AT NIGHT 60 capsule 4   ketoconazole (NIZORAL) 2 % shampoo Apply 1 Application topically 2 (two) times a week. 120 mL 0   montelukast (SINGULAIR) 10 MG tablet Take 1 tablet (10 mg total) by mouth at bedtime. 30 tablet 3   traZODone (DESYREL) 150 MG tablet TAKE 1 TABLET BY MOUTH AT BEDTIME. 90 tablet 2   pantoprazole (PROTONIX) 40 MG tablet Take 1 tablet (40 mg total) by mouth as needed. 90 tablet 1   venlafaxine XR (EFFEXOR XR) 37.5 MG 24 hr capsule Take 1 capsule (37.5 mg total) by mouth daily with breakfast. (Patient not taking: Reported on 08/22/2023) 30 capsule 3   No facility-administered medications prior to visit.    Allergies  Allergen Reactions   Bee Venom Swelling   Onion Rash   Tape Rash    ROS Review of Systems  Constitutional:  Negative for chills and fever.  Eyes:  Negative for visual disturbance.  Respiratory:  Negative for chest tightness and shortness of breath.   Neurological:  Negative for dizziness and headaches.      Objective:    Physical Exam HENT:     Head: Normocephalic.     Mouth/Throat:     Mouth: Mucous membranes are moist.  Cardiovascular:     Rate and Rhythm: Normal rate.     Heart sounds: Normal heart sounds.  Pulmonary:     Effort: Pulmonary effort is normal.     Breath sounds: Normal breath sounds.  Neurological:     Mental Status: She is alert.     BP 125/87 (BP Location: Right Arm, Patient  Position: Sitting, Cuff Size: Normal)   Pulse (!) 47   Ht 5\' 2"  (1.575 m)   Wt 167 lb 1.3 oz (75.8 kg)   SpO2 (!) 68%   BMI 30.56 kg/m  Wt Readings from Last 3 Encounters:  08/22/23 167 lb 1.3 oz (75.8 kg)  08/21/23 168 lb 8 oz (76.4 kg)  06/23/23 167 lb 8 oz (76 kg)    Lab Results  Component Value Date   TSH 4.520 (H) 05/23/2023   Lab Results  Component Value Date   WBC 5.6 05/23/2023   HGB 14.4 05/23/2023   HCT 41.2 05/23/2023   MCV 86 05/23/2023   PLT 255 05/23/2023   Lab Results  Component Value Date   NA 138 05/23/2023   K 3.8 05/23/2023   CO2 18 (L) 05/23/2023   GLUCOSE 93 05/23/2023   BUN 11 05/23/2023   CREATININE 0.85 05/23/2023   BILITOT 0.4 05/23/2023   ALKPHOS 72 05/23/2023   AST 22 05/23/2023   ALT 16 05/23/2023   PROT 6.8 05/23/2023   ALBUMIN 4.2 05/23/2023   CALCIUM 9.4 05/23/2023   ANIONGAP 7 04/24/2022   EGFR 88 05/23/2023   Lab Results  Component Value Date   CHOL 189 05/23/2023   Lab Results  Component Value Date   HDL 52 05/23/2023   Lab Results  Component Value Date   LDLCALC 120 (H) 05/23/2023   Lab Results  Component Value Date   TRIG 92 05/23/2023   Lab Results  Component Value Date   CHOLHDL 3.6 05/23/2023   Lab Results  Component Value Date   HGBA1C 5.1 05/23/2023      Assessment & Plan:  Gastroesophageal reflux disease without esophagitis Assessment & Plan: Encouraged to take Protonix 40 mg twice daily Encouraged adherence to GERD diet For managing GERD, I recommend the following lifestyle changes:  Avoid Certain Foods and Drinks: Limit or eliminate coffee, chocolate, onions, peppermint, spicy foods, carbonated beverages, citrus fruits, tomatoes, garlic, alcohol, and fatty foods such as bacon, burgers, sausages, steak, fried foods, and dairy products.  Recommended Foods: Increase your intake of high-fiber foods including whole grain cereals, oatmeal, brown rice, root vegetables, and non-citrus fruits. Opt for  high-protein foods and healthy fats such as avocados, olive oil, nuts, and seeds.   Orders: -     Pantoprazole Sodium; Take 1 tablet (40 mg total) by mouth 2 (two) times daily.  Dispense: 90 tablet; Refill: 0  Allergic dermatitis Assessment & Plan: No rash was noted today on his skin. The patient was advised to avoid foods that may cause an allergic reaction.     IFG (impaired fasting glucose) -     Hemoglobin A1c  Vitamin D deficiency -     VITAMIN D 25 Hydroxy (Vit-D Deficiency, Fractures)  TSH (thyroid-stimulating hormone deficiency) -     TSH + free T4  Other hyperlipidemia -     Lipid panel -     CMP14+EGFR -     CBC with Differential/Platelet  Note: This chart has been completed using Engineer, civil (consulting) software, and while attempts have been made to ensure accuracy, certain words and phrases may not be transcribed as intended.    Follow-up: Return in about 4 months (around 12/23/2023).   Gilmore Laroche, FNP

## 2023-08-26 DIAGNOSIS — E559 Vitamin D deficiency, unspecified: Secondary | ICD-10-CM | POA: Diagnosis not present

## 2023-08-26 DIAGNOSIS — R7301 Impaired fasting glucose: Secondary | ICD-10-CM | POA: Diagnosis not present

## 2023-08-26 DIAGNOSIS — E038 Other specified hypothyroidism: Secondary | ICD-10-CM | POA: Diagnosis not present

## 2023-08-26 DIAGNOSIS — E7849 Other hyperlipidemia: Secondary | ICD-10-CM | POA: Diagnosis not present

## 2023-08-27 LAB — CBC WITH DIFFERENTIAL/PLATELET
Basophils Absolute: 0 10*3/uL (ref 0.0–0.2)
Basos: 1 %
EOS (ABSOLUTE): 0.1 10*3/uL (ref 0.0–0.4)
Eos: 2 %
Hematocrit: 42.4 % (ref 34.0–46.6)
Hemoglobin: 14.1 g/dL (ref 11.1–15.9)
Immature Grans (Abs): 0 10*3/uL (ref 0.0–0.1)
Immature Granulocytes: 0 %
Lymphocytes Absolute: 1.5 10*3/uL (ref 0.7–3.1)
Lymphs: 31 %
MCH: 30.4 pg (ref 26.6–33.0)
MCHC: 33.3 g/dL (ref 31.5–35.7)
MCV: 91 fL (ref 79–97)
Monocytes Absolute: 0.4 10*3/uL (ref 0.1–0.9)
Monocytes: 8 %
Neutrophils Absolute: 2.9 10*3/uL (ref 1.4–7.0)
Neutrophils: 58 %
Platelets: 271 10*3/uL (ref 150–450)
RBC: 4.64 x10E6/uL (ref 3.77–5.28)
RDW: 12.9 % (ref 11.7–15.4)
WBC: 4.9 10*3/uL (ref 3.4–10.8)

## 2023-08-27 LAB — TSH+FREE T4
Free T4: 1.31 ng/dL (ref 0.82–1.77)
TSH: 3.27 u[IU]/mL (ref 0.450–4.500)

## 2023-08-27 LAB — LIPID PANEL
Chol/HDL Ratio: 3.7 {ratio} (ref 0.0–4.4)
Cholesterol, Total: 178 mg/dL (ref 100–199)
HDL: 48 mg/dL (ref >39)
LDL Chol Calc (NIH): 115 mg/dL — ABNORMAL HIGH (ref 0–99)
Triglycerides: 78 mg/dL (ref 0–149)
VLDL Cholesterol Cal: 15 mg/dL (ref 5–40)

## 2023-08-27 LAB — HEMOGLOBIN A1C
Est. average glucose Bld gHb Est-mCnc: 103 mg/dL
Hgb A1c MFr Bld: 5.2 % (ref 4.8–5.6)

## 2023-08-27 LAB — CMP14+EGFR
ALT: 10 [IU]/L (ref 0–32)
AST: 14 [IU]/L (ref 0–40)
Albumin: 4.3 g/dL (ref 3.9–4.9)
Alkaline Phosphatase: 70 [IU]/L (ref 44–121)
BUN/Creatinine Ratio: 10 (ref 9–23)
BUN: 9 mg/dL (ref 6–24)
Bilirubin Total: 0.4 mg/dL (ref 0.0–1.2)
CO2: 20 mmol/L (ref 20–29)
Calcium: 9.5 mg/dL (ref 8.7–10.2)
Chloride: 108 mmol/L — ABNORMAL HIGH (ref 96–106)
Creatinine, Ser: 0.94 mg/dL (ref 0.57–1.00)
Globulin, Total: 2.8 g/dL (ref 1.5–4.5)
Glucose: 99 mg/dL (ref 70–99)
Potassium: 4.1 mmol/L (ref 3.5–5.2)
Sodium: 140 mmol/L (ref 134–144)
Total Protein: 7.1 g/dL (ref 6.0–8.5)
eGFR: 78 mL/min/{1.73_m2} (ref >59)

## 2023-08-27 LAB — VITAMIN D 25 HYDROXY (VIT D DEFICIENCY, FRACTURES): Vit D, 25-Hydroxy: 39 ng/mL (ref 30.0–100.0)

## 2023-09-03 ENCOUNTER — Encounter: Payer: Self-pay | Admitting: Family Medicine

## 2023-09-03 ENCOUNTER — Other Ambulatory Visit: Payer: Self-pay | Admitting: Family Medicine

## 2023-09-03 ENCOUNTER — Other Ambulatory Visit: Payer: Self-pay

## 2023-09-03 DIAGNOSIS — K219 Gastro-esophageal reflux disease without esophagitis: Secondary | ICD-10-CM

## 2023-09-03 MED ORDER — PANTOPRAZOLE SODIUM 40 MG PO TBEC: 40.0000 mg | DELAYED_RELEASE_TABLET | Freq: Two times a day (BID) | ORAL | 3 refills | Status: DC

## 2023-09-03 NOTE — Telephone Encounter (Signed)
Prior auth started

## 2023-09-05 DIAGNOSIS — M545 Low back pain, unspecified: Secondary | ICD-10-CM | POA: Diagnosis not present

## 2023-09-05 DIAGNOSIS — G894 Chronic pain syndrome: Secondary | ICD-10-CM | POA: Diagnosis not present

## 2023-09-05 DIAGNOSIS — M25551 Pain in right hip: Secondary | ICD-10-CM | POA: Diagnosis not present

## 2023-09-05 DIAGNOSIS — Z79891 Long term (current) use of opiate analgesic: Secondary | ICD-10-CM | POA: Diagnosis not present

## 2023-09-05 DIAGNOSIS — M25552 Pain in left hip: Secondary | ICD-10-CM | POA: Diagnosis not present

## 2023-09-12 DIAGNOSIS — Z419 Encounter for procedure for purposes other than remedying health state, unspecified: Secondary | ICD-10-CM | POA: Diagnosis not present

## 2023-09-13 ENCOUNTER — Other Ambulatory Visit: Payer: Self-pay | Admitting: Adult Health

## 2023-09-21 ENCOUNTER — Other Ambulatory Visit: Payer: Self-pay | Admitting: Family Medicine

## 2023-09-21 DIAGNOSIS — J3089 Other allergic rhinitis: Secondary | ICD-10-CM

## 2023-09-24 ENCOUNTER — Telehealth: Payer: Self-pay | Admitting: Family Medicine

## 2023-09-24 ENCOUNTER — Other Ambulatory Visit: Payer: Self-pay | Admitting: Family Medicine

## 2023-09-24 DIAGNOSIS — L03039 Cellulitis of unspecified toe: Secondary | ICD-10-CM

## 2023-09-24 MED ORDER — MUPIROCIN 2 % EX OINT
1.0000 | TOPICAL_OINTMENT | Freq: Two times a day (BID) | CUTANEOUS | 0 refills | Status: DC
Start: 2023-09-24 — End: 2023-11-22

## 2023-09-24 NOTE — Telephone Encounter (Signed)
Patient had a pedicure done and her big toe is raw and red and hurting she tried to schedule a appt but she doesn't get off until 1:30 today she wants to know if a medication for antibiotic can be called in for her

## 2023-09-24 NOTE — Telephone Encounter (Signed)
A prescription for the topical antibiotic Bactroban ointment has been sent to her pharmacy. I recommend warm soaks for 15 to 20 minutes, 3-4 times daily, to reduce inflammation and pain.

## 2023-09-25 ENCOUNTER — Encounter: Payer: Self-pay | Admitting: Family Medicine

## 2023-09-25 ENCOUNTER — Ambulatory Visit (INDEPENDENT_AMBULATORY_CARE_PROVIDER_SITE_OTHER): Payer: 59 | Admitting: Family Medicine

## 2023-09-25 VITALS — BP 129/85 | HR 107 | Ht 62.0 in | Wt 167.0 lb

## 2023-09-25 DIAGNOSIS — S90425A Blister (nonthermal), left lesser toe(s), initial encounter: Secondary | ICD-10-CM | POA: Diagnosis not present

## 2023-09-25 DIAGNOSIS — L089 Local infection of the skin and subcutaneous tissue, unspecified: Secondary | ICD-10-CM | POA: Insufficient documentation

## 2023-09-25 MED ORDER — DOXYCYCLINE HYCLATE 100 MG PO TABS: 100.0000 mg | ORAL_TABLET | Freq: Two times a day (BID) | ORAL | 0 refills | Status: AC

## 2023-09-25 NOTE — Patient Instructions (Addendum)
        Great to see you today.  I have refilled the medication(s) we provide.    - Please take medications as prescribed. - Follow up with your primary health provider if any health concerns arises. - If symptoms worsen please contact your primary care provider and/or visit the emergency department.  

## 2023-09-25 NOTE — Assessment & Plan Note (Signed)
Doxycyline 100 mg twice daily x 7 days Advised to keep the area clean and dry, and soak the toe in warm, soapy water for 15-20 minutes a few times daily to reduce inflammation. Avoid tight or closed-toe shoes, and monitor for any worsening symptoms, such as increased redness, swelling, or pus.

## 2023-09-25 NOTE — Telephone Encounter (Signed)
Left detailed vm for pt.

## 2023-09-25 NOTE — Progress Notes (Signed)
Established Patient Office Visit   Subjective  Patient ID: Kristina Delacruz, female    DOB: 08/14/1981  Age: 42 y.o. MRN: [id number]  Chief Complaint  Patient presents with   Follow-up    Left Toe infection since 09/20/23 infection started with a pedicure.     She  has a past medical history of Aortic valve insufficiency, Arthritis, GERD (gastroesophageal reflux disease), Mitral valve disorder, Stress fracture of cervical vertebra, and Trichimoniasis (08/13/2021).  HPI The patient has been experiencing an infection in the left toe since September 20, 2023, which began following a recent pedicure. Initial signs included redness, swelling, and tenderness around the toenail area, which have progressively worsened. The patient reports increased discomfort while walking or wearing closed-toe shoes, and the affected area has shown signs of inflammation. There is no history of prior infections in this area, and the patient denies any trauma to the toe.    Review of Systems  Constitutional:  Negative for chills and fever.  Respiratory:  Negative for shortness of breath.   Cardiovascular:  Negative for chest pain.  Gastrointestinal:  Negative for abdominal pain.  Genitourinary:  Negative for dysuria.  Neurological:  Negative for dizziness and headaches.      Objective:     BP 129/85   Pulse (!) 107   Ht 5\' 2"  (1.575 m)   Wt 167 lb 0.6 oz (75.8 kg)   SpO2 97%   BMI 30.55 kg/m  BP Readings from Last 3 Encounters:  09/25/23 129/85  08/22/23 125/87  08/21/23 (!) 136/96      Physical Exam Vitals reviewed.  Constitutional:      General: She is not in acute distress.    Appearance: Normal appearance. She is not ill-appearing, toxic-appearing or diaphoretic.  HENT:     Head: Normocephalic.  Eyes:     General:        Right eye: No discharge.        Left eye: No discharge.     Conjunctiva/sclera: Conjunctivae normal.  Cardiovascular:     Rate and Rhythm: Normal rate.      Pulses: Normal pulses.     Heart sounds: Normal heart sounds.  Pulmonary:     Effort: Pulmonary effort is normal. No respiratory distress.     Breath sounds: Normal breath sounds.  Musculoskeletal:        General: Normal range of motion.     Cervical back: Normal range of motion.  Feet:     Comments: Left toe shows a blister with surrounding redness and inflammation. The area is tender to palpation and has evidence of dried pus on the surface No active drainage noted at this time. Surrounding skin warm to touch; no streaking erythema extending proximally. Skin:    General: Skin is warm and dry.     Capillary Refill: Capillary refill takes less than 2 seconds.  Neurological:     Mental Status: She is alert.     Coordination: Coordination normal.     Gait: Gait normal.  Psychiatric:        Mood and Affect: Mood normal.        Behavior: Behavior normal.      No results found for any visits on 09/25/23.  The 10-year ASCVD risk score (Arnett DK, et al., 2019) is: 0.7%    Assessment & Plan:  Blister of toe of left foot with infection, initial encounter Assessment & Plan: Doxycyline 100 mg twice daily x 7 days Advised to keep  the area clean and dry, and soak the toe in warm, soapy water for 15-20 minutes a few times daily to reduce inflammation. Avoid tight or closed-toe shoes, and monitor for any worsening symptoms, such as increased redness, swelling, or pus.   Other orders -     Doxycycline Hyclate; Take 1 tablet (100 mg total) by mouth 2 (two) times daily for 7 days.  Dispense: 14 tablet; Refill: 0    Return if symptoms worsen or fail to improve.   Cruzita Lederer Newman Nip, FNP

## 2023-09-29 ENCOUNTER — Encounter: Payer: Self-pay | Admitting: Family Medicine

## 2023-10-06 NOTE — Telephone Encounter (Signed)
Please encourage the patient to schedule an appointment to further evaluate her wound.

## 2023-10-10 DIAGNOSIS — H5213 Myopia, bilateral: Secondary | ICD-10-CM | POA: Diagnosis not present

## 2023-10-12 DIAGNOSIS — Z419 Encounter for procedure for purposes other than remedying health state, unspecified: Secondary | ICD-10-CM | POA: Diagnosis not present

## 2023-10-13 ENCOUNTER — Encounter: Payer: Self-pay | Admitting: Internal Medicine

## 2023-10-13 ENCOUNTER — Ambulatory Visit: Payer: Self-pay | Admitting: Family Medicine

## 2023-10-13 ENCOUNTER — Ambulatory Visit (INDEPENDENT_AMBULATORY_CARE_PROVIDER_SITE_OTHER): Payer: 59 | Admitting: Internal Medicine

## 2023-10-13 VITALS — BP 103/74 | HR 134 | Ht 62.0 in | Wt 172.2 lb

## 2023-10-13 DIAGNOSIS — J189 Pneumonia, unspecified organism: Secondary | ICD-10-CM | POA: Insufficient documentation

## 2023-10-13 MED ORDER — AZITHROMYCIN 250 MG PO TABS: ORAL_TABLET | ORAL | 0 refills | Status: DC

## 2023-10-13 MED ORDER — AMOXICILLIN 500 MG PO CAPS: 1000.0000 mg | ORAL_CAPSULE | Freq: Three times a day (TID) | ORAL | 0 refills | Status: AC

## 2023-10-13 NOTE — Progress Notes (Signed)
Acute Office Visit  Subjective:     Patient ID: Kristina Delacruz, female    DOB: October 21, 1981, 42 y.o.   MRN: [id number]  Chief Complaint  Patient presents with   Cough    Coughing for over one week, covid negative last night, low grade fever ,with body aches    [name] presents today for an acute visit endorsing 1 week history of cough, sinus/nasal congestion, myalgias, and subjective fever/chills.  Overall she feels that her symptoms have worsened within the last 3-4 days.  She is unaware of any recent sick contacts.  She completed home COVID-19 testing yesterday and reports that the result was negative.  She endorses clear nasal secretions and scant sputum production with her cough.  She states that she feels congested in her chest but is unable to get secretions up.  Denies nausea/vomiting and diarrhea.  She reports a Tmax of 99.18F.  Review of Systems  Constitutional:  Positive for chills and malaise/fatigue. Negative for fever.  HENT:  Positive for congestion and sinus pain.   Respiratory:  Positive for cough, sputum production and shortness of breath.   Cardiovascular:  Negative for chest pain.  Gastrointestinal:  Negative for diarrhea, nausea and vomiting.  Musculoskeletal:  Positive for myalgias.      Objective:    BP 103/74 (BP Location: Right Arm, Patient Position: Sitting, Cuff Size: Normal)   Pulse (!) 134   Ht 5\' 2"  (1.575 m)   Wt 172 lb 3.2 oz (78.1 kg)   SpO2 97%   BMI 31.50 kg/m   Physical Exam Constitutional:      General: She is not in acute distress.    Appearance: Normal appearance. She is ill-appearing. She is not toxic-appearing.  HENT:     Head: Normocephalic and atraumatic.     Right Ear: External ear normal.     Left Ear: External ear normal.     Nose: Nose normal. No congestion or rhinorrhea.     Mouth/Throat:     Mouth: Mucous membranes are moist.     Pharynx: Oropharynx is clear. No oropharyngeal exudate or posterior oropharyngeal erythema.   Eyes:     General: No scleral icterus.    Extraocular Movements: Extraocular movements intact.     Conjunctiva/sclera: Conjunctivae normal.     Pupils: Pupils are equal, round, and reactive to light.  Cardiovascular:     Rate and Rhythm: Normal rate and regular rhythm.     Pulses: Normal pulses.     Heart sounds: Normal heart sounds. No murmur heard.    No friction rub. No gallop.  Pulmonary:     Effort: Pulmonary effort is normal.     Breath sounds: Rhonchi (bilateral rhonchi) present.  Abdominal:     General: Abdomen is flat. Bowel sounds are normal. There is no distension.     Palpations: Abdomen is soft.     Tenderness: There is no abdominal tenderness.  Musculoskeletal:        General: No swelling. Normal range of motion.     Cervical back: Normal range of motion.     Right lower leg: No edema.     Left lower leg: No edema.  Lymphadenopathy:     Cervical: No cervical adenopathy.  Skin:    General: Skin is warm and dry.     Capillary Refill: Capillary refill takes less than 2 seconds.     Coloration: Skin is not jaundiced.  Neurological:     General: No focal deficit  present.     Mental Status: She is alert and oriented to person, place, and time.  Psychiatric:        Mood and Affect: Mood normal.        Behavior: Behavior normal.       Assessment & Plan:   Problem List Items Addressed This Visit       CAP (community acquired pneumonia) - Primary    Present today for an acute visit endorsing a one week history of the symptoms described above. Symptoms have worsened within the last 3-4 days. Cough is her greatest concern. She feels congested. Bilateral rhonchi noted on exam. Home covid testing was negative. -Amoxicillin/Z-Pak prescribed for empiric treatment of community-acquired pneumonia.  Recommend as needed use of OTC cough/cold medications.  I specifically recommended Mucinex in the setting of chest congestion.  She was instructed return to care if symptoms  worsen or fail to improve.  Otherwise, she is scheduled for routine follow-up with her PCP in February.       Meds ordered this encounter  Medications   azithromycin (ZITHROMAX Z-PAK) 250 MG tablet    Sig: Take 2 tablets (500 mg) PO today, then 1 tablet (250 mg) PO daily x4 days.    Dispense:  6 tablet    Refill:  0   amoxicillin (AMOXIL) 500 MG capsule    Sig: Take 2 capsules (1,000 mg total) by mouth 3 (three) times daily for 5 days.    Dispense:  30 capsule    Refill:  0    Return if symptoms worsen or fail to improve.  Billie Lade, MD

## 2023-10-13 NOTE — Assessment & Plan Note (Signed)
Present today for an acute visit endorsing a one week history of the symptoms described above. Symptoms have worsened within the last 3-4 days. Cough is her greatest concern. She feels congested. Bilateral rhonchi noted on exam. Home covid testing was negative. -Amoxicillin/Z-Pak prescribed for empiric treatment of community-acquired pneumonia.  Recommend as needed use of OTC cough/cold medications.  I specifically recommended Mucinex in the setting of chest congestion.  She was instructed return to care if symptoms worsen or fail to improve.  Otherwise, she is scheduled for routine follow-up with her PCP in February.

## 2023-10-13 NOTE — Patient Instructions (Signed)
It was a pleasure to see you today.  Thank you for giving [place] the opportunity to be involved in your care.  Below is a brief recap of your visit and next steps.  We will plan to see you again in February.  Summary Amoxicillin / Zpak prescribed for treatment of pneumonia I do not recommend returning to work until next Monday 12/9 Return to care if symptoms worsen or fail to improve

## 2023-10-13 NOTE — Telephone Encounter (Signed)
Chief Complaint: difficulty breathing  Symptoms: sore throat, hoarse voice, cough, body aches, headache, "low grade fever", wheezing, "chest pain from cough/congestion"  Frequency: sore throat and losing voice started last week on Friday, symptoms progressed since. Difficulty breathing since 3:30 this morning.  Pertinent Negatives: Patient denies productive cough, recent travel, known exposure to COVID or flu, SOB when lying down/flat, CHF or asthma,COPD history, new foods/products/meds prior to symptoms (allergic reaction)   Disposition: [] ED /[] Urgent Care (no appt availability in office) / [x] Appointment(In office/virtual)/ []  [facility]/ [] Home Care/ [] Refused Recommended Disposition /[] [facility]/ []  Follow-up with PCP Additional Notes: Patient states last week symptoms started with "tickle in throat" and started losing her voice on Friday. Pt states she thought it was due to change in the weather until symptoms progressed yesterday to low grade fevers (99.8,99.2) body aches, headaches, wheezing and cough. Pt states she took a home COVID test yesterday that was negative. Pt states she "woke up sweating" and feeling SOB at 3:30 this morning. Pt agreeable to see Dr Durwin Nora in office this morning and verbalized understanding to go to ED if unable to make appt or if symptoms worsen.  Copied from CRM [id number]. Topic: Clinical - Red Word Triage >> Oct 13, 2023  8:16 AM Dennison Nancy wrote: Red Word that prompted transfer to Nurse Triage: Red word hard time breathing with cough and low grade , tested negative for covid Reason for Disposition  [1] MILD difficulty breathing (e.g., minimal/no SOB at rest, SOB with walking, pulse <100) AND [2] NEW-onset or WORSE than normal  Answer Assessment - Initial Assessment Questions 1. RESPIRATORY STATUS: "Describe your breathing?" (e.g., wheezing, shortness of breath, unable to speak, severe coughing)      Wheezing, SOB, and non productive  cough  2. ONSET: "When did this breathing problem begin?"      Difficulty breathing since 3:30 this morning  3. PATTERN "Does the difficult breathing come and go, or has it been constant since it started?"      Constant since this morning  4. SEVERITY: "How bad is your breathing?" (e.g., mild, moderate, severe)    - MILD: No SOB at rest, mild SOB with walking, speaks normally in sentences, can lie down, no retractions, pulse < 100.    - MODERATE: SOB at rest, SOB with minimal exertion and prefers to sit, cannot lie down flat, speaks in phrases, mild retractions, audible wheezing, pulse 100-120.    - SEVERE: Very SOB at rest, speaks in single words, struggling to breathe, sitting hunched forward, retractions, pulse > 120      Pt reports SOB at rest but denies SOB when lying flat; Pt able to speak without gasping or sounding SOB; hoarse voice from sore throat. No audible wheezing heard.  5. RECURRENT SYMPTOM: "Have you had difficulty breathing before?" If Yes, ask: "When was the last time?" and "What happened that time?"      No  6. 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 LUNG HISTORY: "Do you have any history of lung disease?"  (e.g., pulmonary embolus, asthma, emphysema)     No  8. CAUSE: "What do you think is causing the breathing problem?"      Pt reports cold/flu like symptoms, "I thought it was the weather change"   9.  OTHER SYMPTOMS: "Do you have any other symptoms? (e.g., dizziness, runny nose, cough, chest pain, fever)     Low grade fever, body aches, headache, wheezing, cough, "tickle in throat"  10. O2 SATURATION MONITOR:  "Do you use an oxygen saturation monitor (pulse oximeter) at home?" If Yes, ask: "What is your reading (oxygen level) today?" "What is your usual oxygen saturation reading?" (e.g.,  95%)       Pt states she does not have one to check  11. PREGNANCY: "Is there any chance you are pregnant?" "When was your last menstrual period?"       No 12. TRAVEL: "Have you traveled out of the country in the last month?" (e.g., travel history, exposures)       No  Protocols used: Breathing Difficulty-A-AH

## 2023-10-15 ENCOUNTER — Other Ambulatory Visit: Payer: Self-pay | Admitting: Family Medicine

## 2023-10-15 DIAGNOSIS — F321 Major depressive disorder, single episode, moderate: Secondary | ICD-10-CM

## 2023-10-16 ENCOUNTER — Ambulatory Visit: Payer: 59 | Admitting: Adult Health

## 2023-10-20 ENCOUNTER — Ambulatory Visit (HOSPITAL_COMMUNITY)
Admission: RE | Admit: 2023-10-20 | Discharge: 2023-10-20 | Disposition: A | Discharge status: Home / Self Care | Payer: 59 | Source: Ambulatory Visit | Attending: Internal Medicine | Admitting: Internal Medicine

## 2023-10-20 DIAGNOSIS — I351 Nonrheumatic aortic (valve) insufficiency: Secondary | ICD-10-CM

## 2023-10-20 LAB — ECHOCARDIOGRAM COMPLETE
AR max vel: 2.7 cm2
AV Area VTI: 2.85 cm2
AV Area mean vel: 2.58 cm2
AV Mean grad: 5 mm[Hg]
AV Peak grad: 8.9 mm[Hg]
Ao pk vel: 1.49 m/s
Area-P 1/2: 4.71 cm2
P 1/2 time: 1080 ms
S' Lateral: 2.6 cm

## 2023-10-21 ENCOUNTER — Encounter: Payer: Self-pay | Admitting: Family Medicine

## 2023-10-21 ENCOUNTER — Ambulatory Visit (INDEPENDENT_AMBULATORY_CARE_PROVIDER_SITE_OTHER): Payer: 59 | Admitting: Adult Health

## 2023-10-21 ENCOUNTER — Encounter: Payer: Self-pay | Admitting: Adult Health

## 2023-10-21 VITALS — BP 123/86 | HR 94 | Ht 62.0 in | Wt 172.0 lb

## 2023-10-21 DIAGNOSIS — R4589 Other symptoms and signs involving emotional state: Secondary | ICD-10-CM | POA: Diagnosis not present

## 2023-10-21 DIAGNOSIS — R232 Flushing: Secondary | ICD-10-CM | POA: Diagnosis not present

## 2023-10-21 DIAGNOSIS — K5900 Constipation, unspecified: Secondary | ICD-10-CM

## 2023-10-21 MED ORDER — VENLAFAXINE HCL ER 75 MG PO CP24: 75.0000 mg | ORAL_CAPSULE | Freq: Every day | ORAL | 3 refills | Status: DC

## 2023-10-21 NOTE — Progress Notes (Signed)
Subjective:     Patient ID: Kristina Delacruz, female   DOB: 1981-03-24, 42 y.o.   MRN: [id number]  HPI Kristina Delacruz is a 42 year old white female, single, G1P1001 in for follow up on starting Effexor XR 37.5 mg for moods and is better but feels like it needs increasing.     Component Value Date/Time   DIAGPAP  08/08/2021 1623    - Negative for intraepithelial lesion or malignancy (NILM)   HPVHIGH Negative 08/08/2021 1623   ADEQPAP  08/08/2021 1623    Satisfactory for evaluation; transformation zone component PRESENT.    PCP is Gilmore Laroche NP Review of Systems Still has hot flash about 3 am Moods better but feels like meds need increasing Has had pneumonia +constipation Reviewed past medical,surgical, social and family history. Reviewed medications and allergies.     Objective:   Physical Exam BP 123/86 (BP Location: Left Arm, Patient Position: Sitting, Cuff Size: Normal)   Pulse 94   Ht 5\' 2"  (1.575 m)   Wt 172 lb (78 kg)   LMP  (LMP Unknown) Comment: No periods since ablation  BMI 31.46 kg/m     Skin warm and dry.  Lungs: clear to ausculation bilaterally. Cardiovascular: regular rate and rhythm. Fall risk is low    10/21/2023    4:19 PM 09/25/2023    9:22 AM 08/22/2023    3:13 PM  Depression screen PHQ 2/9  Decreased Interest 1 1 1   Down, Depressed, Hopeless 1 0 1  PHQ - 2 Score 2 1 2   Altered sleeping 1 1 2   Tired, decreased energy 1 1 2   Change in appetite 1 0 0  Feeling bad or failure about yourself  0 0 0  Trouble concentrating 0 0 0  Moving slowly or fidgety/restless 0 0 0  Suicidal thoughts 0 0 0  PHQ-9 Score 5 3 6   Difficult doing work/chores Somewhat difficult Not difficult at all Not difficult at all        10/21/2023    4:22 PM 09/25/2023    9:22 AM 08/22/2023    3:14 PM 06/10/2023   10:03 AM  GAD 7 : Generalized Anxiety Score  Nervous, Anxious, on Edge 0 1 1 1   Control/stop worrying 0 1 0 1  Worry too much - different things 1 0 1 1  Trouble  relaxing 0 0 1 0  Restless 0 0  0  Easily annoyed or irritable 1 1 0 1  Afraid - awful might happen 0 0 0 1  Total GAD 7 Score 2 3  5   Anxiety Difficulty  Not difficult at all Not difficult at all Not difficult at all      Upstream - 10/21/23 1618       Pregnancy Intention Screening   Does the patient want to become pregnant in the next year? N/A    Does the patient's partner want to become pregnant in the next year? N/A    Would the patient like to discuss contraceptive options today? No      Contraception Wrap Up   Current Method Female Sterilization   tubal and ablation   End Method Female Sterilization    Contraception Counseling Provided No             Assessment:     1. Moody Better but thinks Effexor needs increasing, will increase to 75 mg  Meds ordered this encounter  Medications   venlafaxine XR (EFFEXOR-XR) 75 MG 24 hr capsule  Sig: Take 1 capsule (75 mg total) by mouth daily with breakfast.    Dispense:  30 capsule    Refill:  3    Order Specific Question:   Supervising Provider    Answer:   Despina Hidden, LUTHER H [[id number]]     2. Hot flashes Still has hot flash about 3 am Continue slynd has refills  3. Constipation, unspecified constipation type Try senokot S 1-2 daily      Plan:     Follow up in 8 weeks for ROS

## 2023-10-22 ENCOUNTER — Other Ambulatory Visit: Payer: Self-pay | Admitting: Family Medicine

## 2023-10-22 DIAGNOSIS — R058 Other specified cough: Secondary | ICD-10-CM

## 2023-10-22 MED ORDER — PROMETHAZINE-DM 6.25-15 MG/5ML PO SYRP: 5.0000 mL | ORAL_SOLUTION | Freq: Four times a day (QID) | ORAL | 0 refills | Status: DC | PRN

## 2023-10-28 ENCOUNTER — Ambulatory Visit (INDEPENDENT_AMBULATORY_CARE_PROVIDER_SITE_OTHER): Payer: Self-pay | Admitting: Family Medicine

## 2023-10-28 ENCOUNTER — Ambulatory Visit (HOSPITAL_COMMUNITY)
Admission: RE | Admit: 2023-10-28 | Discharge: 2023-10-28 | Disposition: A | Discharge status: Home / Self Care | Payer: 59 | Source: Ambulatory Visit | Attending: Family Medicine | Admitting: Family Medicine

## 2023-10-28 ENCOUNTER — Encounter: Payer: Self-pay | Admitting: Family Medicine

## 2023-10-28 VITALS — BP 137/90 | HR 111 | Temp 98.3°F | Resp 17 | Ht 62.0 in | Wt 168.0 lb

## 2023-10-28 DIAGNOSIS — R059 Cough, unspecified: Secondary | ICD-10-CM | POA: Insufficient documentation

## 2023-10-28 DIAGNOSIS — R918 Other nonspecific abnormal finding of lung field: Secondary | ICD-10-CM | POA: Diagnosis not present

## 2023-10-28 DIAGNOSIS — R9389 Abnormal findings on diagnostic imaging of other specified body structures: Secondary | ICD-10-CM | POA: Diagnosis not present

## 2023-10-28 DIAGNOSIS — R0602 Shortness of breath: Secondary | ICD-10-CM

## 2023-10-28 MED ORDER — PREDNISONE 5 MG (21) PO TBPK: 5.0000 mg | ORAL_TABLET | ORAL | 0 refills | Status: DC

## 2023-10-28 NOTE — Telephone Encounter (Signed)
Patient aware.

## 2023-10-28 NOTE — Patient Instructions (Addendum)
F/u as before with PCP as before, call if you need to be seen sooner  cBc and diff today as a stat  CXR today stat , result message will be sent via My Chart  Go to the ED if symptoms  persist or worsen please  Prednisone dose pack is prescribed for persistent cough   Heart rate has decreased since you first came in,which is good

## 2023-10-28 NOTE — Progress Notes (Signed)
   Kristina Delacruz     MRN: [id number]      DOB: February 03, 1981  Chief Complaint  Patient presents with   Pneumonia    Took amoxicillin and a zpak and cough syrup. Cough has gotten worse, has urinated on herself due to bad coughing spells. Has produced some greenish mucus but mostly unable to get it up. Was carrying groceries and got SOB and had to sit down bc she was out of breath     HPI Kristina Delacruz is here for follow up of a cough which has [persisted  and worsened despite antibiotics over the past 3 weeks. She rreports increased shortness of breath and sputum production which is thick and yellow/ green when she is able to expectorate ROS Denies recent fever or chills. Denies sinus pressure, nasal congestion, ear pain or sore throat.  Denies chest pains, palpitations and leg swelling Denies abdominal pain, nausea, vomiting,diarrhea or constipation.    incontinence.with cough Denies joint pain, swelling and limitation in mobility. =mild depression, and anxiety   PE  BP (!) 137/90   Pulse (!) 111   Temp 98.3 F (36.8 C) (Oral)   Resp 17   Ht 5\' 2"  (1.575 m)   Wt 168 lb (76.2 kg)   LMP  (LMP Unknown) Comment: No periods since ablation  SpO2 95%   BMI 30.73 kg/m   Patient alert and oriented and in mild  cardiopulmonary distress.Anxious  HEENT: No facial asymmetry, EOMI,     Neck supple .No sinus tenderness  Chest: decreased though adequate air entry,  no crackles or wheezes heard, bronchial breath sound sin RLL  CVS: S1, S2 systolic  murmur, no S3.Regular rate.  ABD: Soft non tender.   Ext: No edema  MS: Adequate ROM spine, shoulders, hips and knees.  Skin: Intact, no ulcerations or rash noted.  Psych: Good eye contact, normal affect. Memory intact not anxious or depressed appearing.  CNS: CN 2-12 intact, power,  normal throughout.no focal deficits noted.   Assessment & Plan  Cough in adult Persistent symptoms despite antibiotic course,  some improvement, obtainCXR,  no indication for additonal antibiotics currently, cBc is NLK no recent fever  Abnormal CXR Reported in 10/2023 with f/u imaging recommende by PCP , pt has f/u scheduled in Feb with her pCP  SOB (shortness of breath) Short course prednisone iuis prescribed

## 2023-10-29 LAB — CBC WITH DIFFERENTIAL/PLATELET
Basophils Absolute: 0 10*3/uL (ref 0.0–0.2)
Basos: 0 %
EOS (ABSOLUTE): 0.1 10*3/uL (ref 0.0–0.4)
Eos: 2 %
Hematocrit: 38.8 % (ref 34.0–46.6)
Hemoglobin: 13.2 g/dL (ref 11.1–15.9)
Lymphocytes Absolute: 1.2 10*3/uL (ref 0.7–3.1)
Lymphs: 22 %
MCH: 29.8 pg (ref 26.6–33.0)
MCHC: 34 g/dL (ref 31.5–35.7)
MCV: 88 fL (ref 79–97)
Monocytes Absolute: 0.5 10*3/uL (ref 0.1–0.9)
Monocytes: 9 %
Neutrophils Absolute: 3.6 10*3/uL (ref 1.4–7.0)
Neutrophils: 67 %
Platelets: 303 10*3/uL (ref 150–450)
RBC: 4.43 x10E6/uL (ref 3.77–5.28)
RDW: 13.2 % (ref 11.7–15.4)
WBC: 5.4 10*3/uL (ref 3.4–10.8)

## 2023-10-31 DIAGNOSIS — G894 Chronic pain syndrome: Secondary | ICD-10-CM | POA: Diagnosis not present

## 2023-10-31 DIAGNOSIS — M545 Low back pain, unspecified: Secondary | ICD-10-CM | POA: Diagnosis not present

## 2023-10-31 DIAGNOSIS — M25559 Pain in unspecified hip: Secondary | ICD-10-CM | POA: Diagnosis not present

## 2023-11-02 ENCOUNTER — Encounter: Payer: Self-pay | Admitting: Family Medicine

## 2023-11-02 DIAGNOSIS — R059 Cough, unspecified: Secondary | ICD-10-CM | POA: Insufficient documentation

## 2023-11-02 DIAGNOSIS — R0602 Shortness of breath: Secondary | ICD-10-CM | POA: Insufficient documentation

## 2023-11-02 DIAGNOSIS — R9389 Abnormal findings on diagnostic imaging of other specified body structures: Secondary | ICD-10-CM | POA: Insufficient documentation

## 2023-11-02 NOTE — Assessment & Plan Note (Signed)
Short course prednisone iuis prescribed

## 2023-11-02 NOTE — Assessment & Plan Note (Signed)
Persistent symptoms despite antibiotic course,  some improvement, obtainCXR, no indication for additonal antibiotics currently, cBc is NLK no recent fever

## 2023-11-02 NOTE — Assessment & Plan Note (Signed)
Reported in 10/2023 with f/u imaging recommende by PCP , pt has f/u scheduled in Feb with her pCP

## 2023-11-12 DIAGNOSIS — Z419 Encounter for procedure for purposes other than remedying health state, unspecified: Secondary | ICD-10-CM | POA: Diagnosis not present

## 2023-11-13 ENCOUNTER — Other Ambulatory Visit: Payer: Self-pay | Admitting: Adult Health

## 2023-11-19 ENCOUNTER — Encounter: Payer: Self-pay | Admitting: Family Medicine

## 2023-11-19 ENCOUNTER — Other Ambulatory Visit: Payer: Self-pay

## 2023-11-19 ENCOUNTER — Ambulatory Visit: Payer: Self-pay | Admitting: Family Medicine

## 2023-11-19 ENCOUNTER — Emergency Department (HOSPITAL_COMMUNITY)
Admission: EM | Admit: 2023-11-19 | Discharge: 2023-11-19 | Disposition: A | Discharge status: Home / Self Care | Payer: 59 | Source: Home / Self Care | Attending: Emergency Medicine | Admitting: Emergency Medicine

## 2023-11-19 ENCOUNTER — Encounter (HOSPITAL_COMMUNITY): Payer: Self-pay

## 2023-11-19 ENCOUNTER — Emergency Department (HOSPITAL_COMMUNITY): Payer: 59

## 2023-11-19 DIAGNOSIS — K859 Acute pancreatitis without necrosis or infection, unspecified: Secondary | ICD-10-CM | POA: Diagnosis not present

## 2023-11-19 DIAGNOSIS — R0989 Other specified symptoms and signs involving the circulatory and respiratory systems: Secondary | ICD-10-CM | POA: Diagnosis not present

## 2023-11-19 DIAGNOSIS — R079 Chest pain, unspecified: Secondary | ICD-10-CM | POA: Diagnosis not present

## 2023-11-19 DIAGNOSIS — R072 Precordial pain: Secondary | ICD-10-CM | POA: Insufficient documentation

## 2023-11-19 DIAGNOSIS — K851 Biliary acute pancreatitis without necrosis or infection: Secondary | ICD-10-CM | POA: Diagnosis not present

## 2023-11-19 DIAGNOSIS — J9811 Atelectasis: Secondary | ICD-10-CM | POA: Diagnosis not present

## 2023-11-19 LAB — CBC
HCT: 41.4 % (ref 36.0–46.0)
Hemoglobin: 13.9 g/dL (ref 12.0–15.0)
MCH: 30.1 pg (ref 26.0–34.0)
MCHC: 33.6 g/dL (ref 30.0–36.0)
MCV: 89.6 fL (ref 80.0–100.0)
Platelets: 280 10*3/uL (ref 150–400)
RBC: 4.62 MIL/uL (ref 3.87–5.11)
RDW: 12.7 % (ref 11.5–15.5)
WBC: 6.1 10*3/uL (ref 4.0–10.5)
nRBC: 0 % (ref 0.0–0.2)

## 2023-11-19 LAB — BASIC METABOLIC PANEL
Anion gap: 8 (ref 5–15)
BUN: 9 mg/dL (ref 6–20)
CO2: 21 mmol/L — ABNORMAL LOW (ref 22–32)
Calcium: 9.2 mg/dL (ref 8.9–10.3)
Chloride: 106 mmol/L (ref 98–111)
Creatinine, Ser: 0.72 mg/dL (ref 0.44–1.00)
GFR, Estimated: >60 mL/min (ref >60)
Glucose, Bld: 94 mg/dL (ref 70–99)
Potassium: 4 mmol/L (ref 3.5–5.1)
Sodium: 135 mmol/L (ref 135–145)

## 2023-11-19 LAB — HCG, SERUM, QUALITATIVE: Preg, Serum: NEGATIVE

## 2023-11-19 LAB — TROPONIN I (HIGH SENSITIVITY)
Troponin I (High Sensitivity): <2 ng/L (ref <18)
Troponin I (High Sensitivity): <2 ng/L (ref <18)

## 2023-11-19 NOTE — ED Provider Notes (Signed)
 [place] AT [facility] Provider Note   CSN: [id number] Arrival date & time: 11/19/23  1720     History  Chief Complaint  Patient presents with   Chest Pain    Kristina Delacruz is a 43 y.o. female.  Patient history of GERD, aortic valve insufficiency, mitral valve disorder presents today with complaints of chest pain.  She states that same has been occurring intermittently for the past 1 year.  She has seen several specialist for this including cardiology and has had an echocardiogram that showed aortic valve insufficiency and mitral valve disorder, she had a normal stress test.  She has been here several times for this as well with a negative workup.  She states that her pain is intermittent in nature and occurs without a discernible trigger.  Her patient is substernal and beneath her bilateral breasts and does not radiate.  It feels sharp in nature.  She does note that she occasionally has nausea and diaphoresis as well.  That she normally tries to lay down in a cold floor and it resolves after 15 to 45 minutes without any other intervention.  She denies any shortness of breath with this.  Her last episode of this was at 1130 this morning and lasted about 45 minutes and then resolved spontaneously.  Since then, she has been completely asymptomatic.  She does not smoke and denies recreational drug use.  No leg pain or leg swelling.  No history of blood clots.  No recent travel or surgeries.  No PND or orthopnea.  She is on progesterone only birth control.  The history is provided by the patient. No language interpreter was used.  Chest Pain      Home Medications Prior to Admission medications   Medication Sig Start Date End Date Taking? Authorizing Provider  Bacillus Coagulans-Inulin (PROBIOTIC-PREBIOTIC PO) Take by mouth.    [provider]  Cetirizine  HCl (ZYRTEC  PO) Take by mouth.    [provider]  cyclobenzaprine  (FLEXERIL ) 5 MG  tablet Take 5 mg by mouth daily. 09/05/23   [provider]  Drospirenone  (SLYND ) 4 MG TABS Take 1 tablet (4 mg total) by mouth daily. 12/23/22   Signa Delon LABOR, NP  gabapentin  (NEURONTIN ) 100 MG capsule TAKE 2 CAPSULES BY MOUTH AT NIGHT 06/02/23   Margrette Taft BRAVO, MD  ketoconazole  (NIZORAL ) 2 % shampoo Apply 1 Application topically 2 (two) times a week. 06/12/23   Zarwolo, Gloria, FNP  montelukast  (SINGULAIR ) 10 MG tablet TAKE 1 TABLET BY MOUTH EVERYDAY AT BEDTIME 09/22/23   Zarwolo, Gloria, FNP  mupirocin  ointment (BACTROBAN ) 2 % Apply 1 Application topically 2 (two) times daily. 09/24/23   Zarwolo, Gloria, FNP  oxyCODONE -acetaminophen  (PERCOCET/ROXICET) 5-325 MG tablet SMARTSIG:1 Tablet(s) By Mouth Every 12 Hours 10/03/23   [provider]  pantoprazole  (PROTONIX ) 40 MG tablet Take 1 tablet (40 mg total) by mouth 2 (two) times daily. 09/03/23 01/01/24  Zarwolo, Gloria, FNP  predniSONE  (STERAPRED UNI-PAK 21 TAB) 5 MG (21) TBPK tablet Take 1 tablet (5 mg total) by mouth as directed. Use as directed 10/28/23   Antonetta Rollene BRAVO, MD  promethazine -dextromethorphan (PROMETHAZINE -DM) 6.25-15 MG/5ML syrup Take 5 mLs by mouth 4 (four) times daily as needed. 10/22/23   Zarwolo, Gloria, FNP  traZODone  (DESYREL ) 150 MG tablet TAKE 1 TABLET BY MOUTH EVERYDAY AT BEDTIME 10/17/23   Zarwolo, Gloria, FNP  venlafaxine  XR (EFFEXOR -XR) 75 MG 24 hr capsule TAKE 1 CAPSULE BY MOUTH DAILY WITH BREAKFAST. 11/13/23  Signa Nest A, NP  diphenhydrAMINE  (BENADRYL ) 25 MG tablet Take 25 mg by mouth every 6 (six) hours as needed.  05/14/13  [provider]      Allergies    Bee venom, Onion, and Tape    Review of Systems   Review of Systems  Cardiovascular:  Positive for chest pain (Resolved).  All other systems reviewed and are negative.   Physical Exam Updated Vital Signs BP 121/85   Pulse 83   Temp 98.3 F (36.8 C) (Oral)   Resp 18   Ht 5' 2 (1.575 m)   Wt 74.8 kg   LMP   (LMP Unknown) Comment: No periods since ablation  SpO2 98%   BMI 30.18 kg/m  Physical Exam Vitals and nursing note reviewed.  Constitutional:      General: She is not in acute distress.    Appearance: Normal appearance. She is normal weight. She is not ill-appearing, toxic-appearing or diaphoretic.  HENT:     Head: Normocephalic and atraumatic.  Cardiovascular:     Rate and Rhythm: Normal rate and regular rhythm.     Heart sounds: Murmur heard.  Pulmonary:     Effort: Pulmonary effort is normal. No respiratory distress.     Breath sounds: Normal breath sounds.  Chest:     Chest wall: No tenderness.  Abdominal:     Palpations: Abdomen is soft.     Tenderness: There is no abdominal tenderness.  Musculoskeletal:        General: Normal range of motion.     Cervical back: Normal range of motion.     Right lower leg: No tenderness. No edema.     Left lower leg: No tenderness. No edema.  Skin:    General: Skin is warm and dry.  Neurological:     General: No focal deficit present.     Mental Status: She is alert.  Psychiatric:        Mood and Affect: Mood normal.        Behavior: Behavior normal.     ED Results / Procedures / Treatments   Labs (all labs ordered are listed, but only abnormal results are displayed) Labs Reviewed  BASIC METABOLIC PANEL - Abnormal; Notable for the following components:      Result Value   CO2 21 (*)    All other components within normal limits  CBC  HCG, SERUM, QUALITATIVE  POC URINE PREG, ED  TROPONIN I (HIGH SENSITIVITY)  TROPONIN I (HIGH SENSITIVITY)    EKG None  Radiology DG Chest 2 View Result Date: 11/19/2023 CLINICAL DATA:  Chest pain EXAM: CHEST - 2 VIEW COMPARISON:  10/28/2023 FINDINGS: The heart size and mediastinal contours are within normal limits. Low lung volumes with basilar atelectasis. Otherwise no focal consolidation, pleural effusion, or pneumothorax. No displaced rib fractures. IMPRESSION: No active cardiopulmonary  disease. Electronically Signed   By: Norman Gatlin M.D.   On: 11/19/2023 19:12    Procedures Procedures    Medications Ordered in ED Medications - No data to display  ED Course/ Medical Decision Making/ A&P             HEART Score: 1                    Medical Decision Making Amount and/or Complexity of Data Reviewed Labs: ordered. Radiology: ordered.   This patient is a 43 y.o. female who presents to the ED for concern of chest pain, this involves an extensive  number of treatment options, and is a complaint that carries with it a high risk of complications and morbidity. The emergent differential diagnosis prior to evaluation includes, but is not limited to,  ACS, pericarditis, myocarditis, aortic dissection, PE, pneumothorax, esophageal spasm or rupture, chronic angina, pneumonia, bronchitis, GERD, reflux/PUD, biliary disease, pancreatitis, costochondritis, anxiety   This is not an exhaustive differential.   Past Medical History / Co-morbidities / Social History:  has a past medical history of Aortic valve insufficiency, Arthritis, GERD (gastroesophageal reflux disease), Mitral valve disorder, Stress fracture of cervical vertebra, and Trichimoniasis (08/13/2021).  Additional history: Chart reviewed. Pertinent results include: had negative PE study in 06/23. Also had pneumonia on CXR on 10/28/23. Echo on 12/09 showed EF 60-65% with aortic valve regurgitation  Physical Exam: Physical exam performed. The pertinent findings include: no acute physical exam abnormalities  Lab Tests: I ordered, and personally interpreted labs.  The pertinent results include:  delta troponin WNL, no acute laboratory abnormalities   Imaging Studies: I ordered imaging studies including CXR. I independently visualized and interpreted imaging which showed NAD. I agree with the radiologist interpretation.   Cardiac Monitoring:  The patient was maintained on a cardiac monitor.  Cardiac monitor showed  an underlying rhythm of: sinus rhythm, no STEMI. I agree with this interpretation.  Disposition: After consideration of the diagnostic results and the patients response to treatment, I feel that emergency department workup does not suggest an emergent condition requiring admission or immediate intervention beyond what has been performed at this time. The plan is: discharge with close outpatient follow-up and return precautions. HEART score 1, PERC negative. Symptoms resolved several hours ago and have not returned. Work-up benign. She has appointment with cardiology for follow-up on 1/31. Recommend she discuss these symptoms further at this appointment. Evaluation and diagnostic testing in the emergency department does not suggest an emergent condition requiring admission or immediate intervention beyond what has been performed at this time.  Plan for discharge with close PCP follow-up.  Patient is understanding and amenable with plan, educated on red flag symptoms that would prompt immediate return.  Patient discharged in stable condition.  Final Clinical Impression(s) / ED Diagnoses Final diagnoses:  Precordial chest pain    Rx / DC Orders ED Discharge Orders     None     An After Visit Summary was printed and given to the patient.     Nora Lauraine DELENA DEVONNA 11/19/23 2300    Mannie Pac T, DO 11/20/23 2244

## 2023-11-19 NOTE — Discharge Instructions (Signed)
 As we discussed, your workup in the ER today was reassuring for acute findings.  Laboratory evaluation, chest x-ray, and EKG did not reveal any emergent cause of your pain.  I recommend that you follow-up closely with your cardiologist and your primary doctor at your next scheduled appointment.  Return if development of any new or worsening symptoms.

## 2023-11-19 NOTE — ED Triage Notes (Signed)
 Pt states she started having chest pain yesterday and it went away, this morning she was getting ready for work and chest started hurting with nausea and clammy feeling, lasted about 15 minutes and went away. Pt states she had another pain around 1130 this morning. Pt called PCP he recommended to be checked out.

## 2023-11-19 NOTE — Telephone Encounter (Signed)
  Chief Complaint: Chest Pain Symptoms: Chest Pain, Nausea, cold sweats Frequency: 3 times in the last 24 hours. Pertinent Negatives: Patient denies fever Disposition: [x] ED /[] Urgent Care (no appt availability in office) / [] Appointment(In office/virtual)/ []  [facility]/ [] Home Care/ [] Refused Recommended Disposition /[] [facility]/ []  Follow-up with PCP Additional Notes: patient c/o chest pain, nausea and cold sweats. Patient endorses 3 episodes since yesterday. Patient states pain at her breast line area. Pain radiates from side to side. Per protocol, the recommendation is ED. Patient verbalizes understand of plan and all questions answered.    Copied from CRM [id number]. Topic: Clinical - Red Word Triage >> Nov 19, 2023  4:40 PM Renea ORN wrote: Red Word that prompted transfer to Nurse Triage: Chest Pain Reason for Disposition  [1] Chest pain (or angina) comes and goes AND [2] is happening more often (increasing in frequency) or getting worse (increasing in severity)  (Exception: Chest pains that last only a few seconds.)  Answer Assessment - Initial Assessment Questions 1. LOCATION: Where does it hurt?       Right under breast area-mainly on the right side. 2. RADIATION: Does the pain go anywhere else? (e.g., into neck, jaw, arms, back)     Radiates across chest 3. ONSET: When did the chest pain begin? (Minutes, hours or days)      Started this morning 4. PATTERN: Does the pain come and go, or has it been constant since it started?  Does it get worse with exertion?      Comes and goes 5. DURATION: How long does it last (e.g., seconds, minutes, hours)     45 mins 6. SEVERITY: How bad is the pain?  (e.g., Scale 1-10; mild, moderate, or severe)    - MILD (1-3): doesn't interfere with normal activities     - MODERATE (4-7): interferes with normal activities or awakens from sleep    - SEVERE (8-10): excruciating pain, unable to do any normal  activities       7 out of 10 7. CARDIAC RISK FACTORS: Do you have any history of heart problems or risk factors for heart disease? (e.g., angina, prior heart attack; diabetes, high blood pressure, high cholesterol, smoker, or strong family history of heart disease)     Valve issues 8. PULMONARY RISK FACTORS: Do you have any history of lung disease?  (e.g., blood clots in lung, asthma, emphysema, birth control pills)     no 9. CAUSE: What do you think is causing the chest pain?     Unsure 10. OTHER SYMPTOMS: Do you have any other symptoms? (e.g., dizziness, nausea, vomiting, sweating, fever, difficulty breathing, cough)       Nausea, clammy feeling  Protocols used: Chest Pain-A-AH

## 2023-11-20 NOTE — Telephone Encounter (Signed)
Pt seen by ED

## 2023-11-22 ENCOUNTER — Emergency Department (HOSPITAL_COMMUNITY): Payer: 59

## 2023-11-22 ENCOUNTER — Inpatient Hospital Stay (HOSPITAL_COMMUNITY)
Admission: EM | Admit: 2023-11-22 | Discharge: 2023-11-28 | DRG: 418 | Disposition: A | Discharge status: Home / Self Care | Payer: 59 | Attending: Family Medicine | Admitting: Family Medicine

## 2023-11-22 ENCOUNTER — Other Ambulatory Visit: Payer: Self-pay

## 2023-11-22 ENCOUNTER — Encounter (HOSPITAL_COMMUNITY): Payer: Self-pay | Admitting: Internal Medicine

## 2023-11-22 DIAGNOSIS — E876 Hypokalemia: Secondary | ICD-10-CM | POA: Diagnosis not present

## 2023-11-22 DIAGNOSIS — Z6831 Body mass index (BMI) 31.0-31.9, adult: Secondary | ICD-10-CM | POA: Diagnosis not present

## 2023-11-22 DIAGNOSIS — Z87891 Personal history of nicotine dependence: Secondary | ICD-10-CM

## 2023-11-22 DIAGNOSIS — Z833 Family history of diabetes mellitus: Secondary | ICD-10-CM

## 2023-11-22 DIAGNOSIS — F419 Anxiety disorder, unspecified: Secondary | ICD-10-CM | POA: Diagnosis present

## 2023-11-22 DIAGNOSIS — K219 Gastro-esophageal reflux disease without esophagitis: Secondary | ICD-10-CM | POA: Diagnosis not present

## 2023-11-22 DIAGNOSIS — R110 Nausea: Secondary | ICD-10-CM | POA: Diagnosis not present

## 2023-11-22 DIAGNOSIS — Z91018 Allergy to other foods: Secondary | ICD-10-CM | POA: Diagnosis not present

## 2023-11-22 DIAGNOSIS — Z885 Allergy status to narcotic agent status: Secondary | ICD-10-CM

## 2023-11-22 DIAGNOSIS — Z91048 Other nonmedicinal substance allergy status: Secondary | ICD-10-CM

## 2023-11-22 DIAGNOSIS — K858 Other acute pancreatitis without necrosis or infection: Secondary | ICD-10-CM | POA: Diagnosis not present

## 2023-11-22 DIAGNOSIS — K8010 Calculus of gallbladder with chronic cholecystitis without obstruction: Secondary | ICD-10-CM | POA: Diagnosis present

## 2023-11-22 DIAGNOSIS — R1084 Generalized abdominal pain: Secondary | ICD-10-CM | POA: Diagnosis not present

## 2023-11-22 DIAGNOSIS — E66811 Obesity, class 1: Secondary | ICD-10-CM | POA: Diagnosis present

## 2023-11-22 DIAGNOSIS — R1111 Vomiting without nausea: Secondary | ICD-10-CM | POA: Diagnosis not present

## 2023-11-22 DIAGNOSIS — Z743 Need for continuous supervision: Secondary | ICD-10-CM | POA: Diagnosis not present

## 2023-11-22 DIAGNOSIS — N83292 Other ovarian cyst, left side: Secondary | ICD-10-CM | POA: Diagnosis not present

## 2023-11-22 DIAGNOSIS — Z800 Family history of malignant neoplasm of digestive organs: Secondary | ICD-10-CM | POA: Diagnosis not present

## 2023-11-22 DIAGNOSIS — K859 Acute pancreatitis without necrosis or infection, unspecified: Secondary | ICD-10-CM | POA: Diagnosis not present

## 2023-11-22 DIAGNOSIS — K851 Biliary acute pancreatitis without necrosis or infection: Principal | ICD-10-CM | POA: Diagnosis present

## 2023-11-22 DIAGNOSIS — Z79899 Other long term (current) drug therapy: Secondary | ICD-10-CM

## 2023-11-22 DIAGNOSIS — F321 Major depressive disorder, single episode, moderate: Secondary | ICD-10-CM | POA: Diagnosis present

## 2023-11-22 DIAGNOSIS — Z79891 Long term (current) use of opiate analgesic: Secondary | ICD-10-CM

## 2023-11-22 DIAGNOSIS — E6609 Other obesity due to excess calories: Secondary | ICD-10-CM | POA: Diagnosis present

## 2023-11-22 DIAGNOSIS — Z8249 Family history of ischemic heart disease and other diseases of the circulatory system: Secondary | ICD-10-CM

## 2023-11-22 DIAGNOSIS — K660 Peritoneal adhesions (postprocedural) (postinfection): Secondary | ICD-10-CM | POA: Diagnosis present

## 2023-11-22 DIAGNOSIS — I080 Rheumatic disorders of both mitral and aortic valves: Secondary | ICD-10-CM | POA: Diagnosis not present

## 2023-11-22 DIAGNOSIS — Z8701 Personal history of pneumonia (recurrent): Secondary | ICD-10-CM

## 2023-11-22 DIAGNOSIS — Z83438 Family history of other disorder of lipoprotein metabolism and other lipidemia: Secondary | ICD-10-CM

## 2023-11-22 DIAGNOSIS — K8050 Calculus of bile duct without cholangitis or cholecystitis without obstruction: Secondary | ICD-10-CM

## 2023-11-22 DIAGNOSIS — K828 Other specified diseases of gallbladder: Secondary | ICD-10-CM | POA: Diagnosis present

## 2023-11-22 DIAGNOSIS — R9431 Abnormal electrocardiogram [ECG] [EKG]: Secondary | ICD-10-CM | POA: Diagnosis not present

## 2023-11-22 DIAGNOSIS — K8020 Calculus of gallbladder without cholecystitis without obstruction: Secondary | ICD-10-CM | POA: Diagnosis not present

## 2023-11-22 DIAGNOSIS — E871 Hypo-osmolality and hyponatremia: Secondary | ICD-10-CM | POA: Diagnosis not present

## 2023-11-22 DIAGNOSIS — R109 Unspecified abdominal pain: Secondary | ICD-10-CM | POA: Diagnosis not present

## 2023-11-22 DIAGNOSIS — R4589 Other symptoms and signs involving emotional state: Secondary | ICD-10-CM | POA: Diagnosis present

## 2023-11-22 DIAGNOSIS — K5900 Constipation, unspecified: Secondary | ICD-10-CM | POA: Diagnosis not present

## 2023-11-22 DIAGNOSIS — R1011 Right upper quadrant pain: Secondary | ICD-10-CM | POA: Diagnosis not present

## 2023-11-22 DIAGNOSIS — N926 Irregular menstruation, unspecified: Secondary | ICD-10-CM | POA: Diagnosis not present

## 2023-11-22 DIAGNOSIS — I351 Nonrheumatic aortic (valve) insufficiency: Secondary | ICD-10-CM | POA: Diagnosis present

## 2023-11-22 DIAGNOSIS — Z91030 Bee allergy status: Secondary | ICD-10-CM | POA: Diagnosis not present

## 2023-11-22 DIAGNOSIS — R112 Nausea with vomiting, unspecified: Secondary | ICD-10-CM | POA: Diagnosis not present

## 2023-11-22 DIAGNOSIS — Z8349 Family history of other endocrine, nutritional and metabolic diseases: Secondary | ICD-10-CM

## 2023-11-22 LAB — LIPID PANEL
Cholesterol: 207 mg/dL — ABNORMAL HIGH (ref 0–200)
HDL: 61 mg/dL (ref >40)
LDL Cholesterol: 131 mg/dL — ABNORMAL HIGH (ref 0–99)
Total CHOL/HDL Ratio: 3.4 {ratio}
Triglycerides: 77 mg/dL (ref <150)
VLDL: 15 mg/dL (ref 0–40)

## 2023-11-22 LAB — CBC
HCT: 43.3 % (ref 36.0–46.0)
Hemoglobin: 15 g/dL (ref 12.0–15.0)
MCH: 30.2 pg (ref 26.0–34.0)
MCHC: 34.6 g/dL (ref 30.0–36.0)
MCV: 87.1 fL (ref 80.0–100.0)
Platelets: 269 10*3/uL (ref 150–400)
RBC: 4.97 MIL/uL (ref 3.87–5.11)
RDW: 12.5 % (ref 11.5–15.5)
WBC: 11.1 10*3/uL — ABNORMAL HIGH (ref 4.0–10.5)
nRBC: 0 % (ref 0.0–0.2)

## 2023-11-22 LAB — COMPREHENSIVE METABOLIC PANEL WITH GFR
ALT: 1192 U/L — ABNORMAL HIGH (ref 0–44)
AST: 1406 U/L — ABNORMAL HIGH (ref 15–41)
Albumin: 4.1 g/dL (ref 3.5–5.0)
Alkaline Phosphatase: 145 U/L — ABNORMAL HIGH (ref 38–126)
Anion gap: 8 (ref 5–15)
BUN: 8 mg/dL (ref 6–20)
CO2: 21 mmol/L — ABNORMAL LOW (ref 22–32)
Calcium: 9.2 mg/dL (ref 8.9–10.3)
Chloride: 106 mmol/L (ref 98–111)
Creatinine, Ser: 0.68 mg/dL (ref 0.44–1.00)
GFR, Estimated: >60 mL/min
Glucose, Bld: 159 mg/dL — ABNORMAL HIGH (ref 70–99)
Potassium: 4.3 mmol/L (ref 3.5–5.1)
Sodium: 135 mmol/L (ref 135–145)
Total Bilirubin: 2.9 mg/dL — ABNORMAL HIGH (ref 0.0–1.2)
Total Protein: 7.8 g/dL (ref 6.5–8.1)

## 2023-11-22 LAB — URINALYSIS, ROUTINE W REFLEX MICROSCOPIC
Bilirubin Urine: NEGATIVE
Glucose, UA: NEGATIVE mg/dL
Hgb urine dipstick: NEGATIVE
Ketones, ur: 5 mg/dL — AB
Leukocytes,Ua: NEGATIVE
Nitrite: NEGATIVE
Protein, ur: NEGATIVE mg/dL
Specific Gravity, Urine: 1.041 — ABNORMAL HIGH (ref 1.005–1.030)
pH: 5 (ref 5.0–8.0)

## 2023-11-22 LAB — LIPASE, BLOOD: Lipase: 3303 U/L — ABNORMAL HIGH (ref 11–51)

## 2023-11-22 LAB — PREGNANCY, URINE: Preg Test, Ur: NEGATIVE

## 2023-11-22 MED ORDER — MORPHINE SULFATE (PF) 4 MG/ML IV SOLN
4.0000 mg | Freq: Once | INTRAVENOUS | Status: AC
  Administered 2023-11-22: 4 mg via INTRAVENOUS
  Filled 2023-11-22: qty 1

## 2023-11-22 MED ORDER — DROSPIRENONE 4 MG PO TABS
1.0000 | ORAL_TABLET | Freq: Every day | ORAL | Status: DC
  Administered 2023-11-22 – 2023-11-26 (×5): 4 mg via ORAL

## 2023-11-22 MED ORDER — MORPHINE SULFATE (PF) 2 MG/ML IV SOLN
2.0000 mg | INTRAVENOUS | Status: DC | PRN
  Administered 2023-11-22 – 2023-11-28 (×32): 2 mg via INTRAVENOUS
  Filled 2023-11-22 (×31): qty 1

## 2023-11-22 MED ORDER — HEPARIN SODIUM (PORCINE) 5000 UNIT/ML IJ SOLN
5000.0000 [IU] | Freq: Three times a day (TID) | INTRAMUSCULAR | Status: DC
  Administered 2023-11-22 – 2023-11-28 (×16): 5000 [IU] via SUBCUTANEOUS
  Filled 2023-11-22 (×16): qty 1

## 2023-11-22 MED ORDER — SODIUM CHLORIDE 0.9 % IV SOLN: INTRAVENOUS | Status: AC

## 2023-11-22 MED ORDER — SODIUM CHLORIDE 0.9 % IV BOLUS: 1000.0000 mL | Freq: Once | INTRAVENOUS | Status: DC

## 2023-11-22 MED ORDER — ONDANSETRON HCL 4 MG/2ML IJ SOLN
4.0000 mg | Freq: Four times a day (QID) | INTRAMUSCULAR | Status: DC | PRN
  Administered 2023-11-22 – 2023-11-27 (×11): 4 mg via INTRAVENOUS
  Filled 2023-11-22 (×11): qty 2

## 2023-11-22 MED ORDER — IOHEXOL 300 MG/ML  SOLN
100.0000 mL | Freq: Once | INTRAMUSCULAR | Status: AC | PRN
  Administered 2023-11-22: 100 mL via INTRAVENOUS

## 2023-11-22 MED ORDER — CYCLOSPORINE 0.05 % OP EMUL
1.0000 [drp] | Freq: Two times a day (BID) | OPHTHALMIC | Status: DC
  Administered 2023-11-22 – 2023-11-28 (×12): 1 [drp] via OPHTHALMIC
  Filled 2023-11-22 (×12): qty 30

## 2023-11-22 MED ORDER — PANTOPRAZOLE SODIUM 40 MG PO TBEC
40.0000 mg | DELAYED_RELEASE_TABLET | Freq: Two times a day (BID) | ORAL | Status: DC
  Administered 2023-11-22 – 2023-11-28 (×12): 40 mg via ORAL
  Filled 2023-11-22 (×12): qty 1

## 2023-11-22 MED ORDER — MORPHINE SULFATE (PF) 4 MG/ML IV SOLN
INTRAVENOUS | Status: AC
  Filled 2023-11-22: qty 1

## 2023-11-22 MED ORDER — DIPHENHYDRAMINE HCL 25 MG PO CAPS: 25.0000 mg | ORAL_CAPSULE | Freq: Once | ORAL | Status: DC

## 2023-11-22 MED ORDER — ALUM & MAG HYDROXIDE-SIMETH 200-200-20 MG/5ML PO SUSP
30.0000 mL | Freq: Once | ORAL | Status: AC
  Administered 2023-11-22: 30 mL via ORAL
  Filled 2023-11-22: qty 30

## 2023-11-22 MED ORDER — LACTATED RINGERS IV SOLN: INTRAVENOUS | Status: AC

## 2023-11-22 MED ORDER — SODIUM CHLORIDE 0.9 % IV BOLUS
2000.0000 mL | Freq: Once | INTRAVENOUS | Status: AC
  Administered 2023-11-22: 2000 mL via INTRAVENOUS

## 2023-11-22 MED ORDER — DICYCLOMINE HCL 10 MG PO CAPS
10.0000 mg | ORAL_CAPSULE | Freq: Once | ORAL | Status: AC
  Administered 2023-11-22: 10 mg via ORAL
  Filled 2023-11-22: qty 1

## 2023-11-22 MED ORDER — ONDANSETRON HCL 4 MG PO TABS
4.0000 mg | ORAL_TABLET | Freq: Four times a day (QID) | ORAL | Status: DC | PRN
  Administered 2023-11-24 – 2023-11-26 (×2): 4 mg via ORAL
  Filled 2023-11-22 (×2): qty 1

## 2023-11-22 MED ORDER — ONDANSETRON HCL 4 MG/2ML IJ SOLN
4.0000 mg | Freq: Once | INTRAMUSCULAR | Status: AC | PRN
  Administered 2023-11-22: 4 mg via INTRAVENOUS
  Filled 2023-11-22: qty 2

## 2023-11-22 MED ORDER — KETOROLAC TROMETHAMINE 15 MG/ML IJ SOLN
15.0000 mg | Freq: Once | INTRAMUSCULAR | Status: AC
  Administered 2023-11-22: 15 mg via INTRAVENOUS
  Filled 2023-11-22: qty 1

## 2023-11-22 MED ORDER — VENLAFAXINE HCL ER 75 MG PO CP24
75.0000 mg | ORAL_CAPSULE | Freq: Every day | ORAL | Status: DC
  Administered 2023-11-23 – 2023-11-28 (×6): 75 mg via ORAL
  Filled 2023-11-22 (×6): qty 1

## 2023-11-22 MED ORDER — TRAZODONE HCL 50 MG PO TABS
150.0000 mg | ORAL_TABLET | Freq: Every day | ORAL | Status: DC
  Administered 2023-11-22 – 2023-11-27 (×6): 150 mg via ORAL
  Filled 2023-11-22 (×6): qty 3

## 2023-11-22 MED ORDER — DIPHENHYDRAMINE HCL 50 MG/ML IJ SOLN
25.0000 mg | Freq: Once | INTRAMUSCULAR | Status: AC
  Administered 2023-11-22: 25 mg via INTRAVENOUS
  Filled 2023-11-22: qty 1

## 2023-11-22 NOTE — ED Triage Notes (Signed)
 Pt arrived [facility] for ABD pain on right side since weds. Pt began vomiting and nausea last night with sweats and bowel movements this morning.

## 2023-11-22 NOTE — H&P (Signed)
 History and Physical    Kristina Delacruz FMW:[id number] DOB: [birth year] DOA: 11/22/2023  PCP: Zarwolo, Gloria, FNP   Patient coming from: Home  Chief Complaint: Abdominal pain with N/V  HPI: Kristina Delacruz is a 43 y.o. female with medical history significant for GERD, anxiety/depression, aortic valve insufficiency, and mitral valve disorder who presented to the ED with complaints of right-sided abdominal pain that started 3 days ago.  This was later accompanied by nausea and vomiting that started about 1 day ago.  She denies any fevers or chills, but has had some mild sweats.  She has had some mild diarrhea as well.  She denies any hematemesis or blood in the stools or dark stools.  She denies any alcohol use or tobacco abuse or other illicit drugs.   ED Course: Vital signs stable and patient afebrile.  Leukocytosis of 11,100 noted along with significant AST/ALT and lipase elevation.  CT of the abdomen pelvis with findings of stranding near the pancreas along with cholelithiasis.  Right upper quadrant ultrasound with no common bile duct dilation.  She has been given some antiemetics and pain medications in the ED.  Review of Systems: Reviewed as noted above, otherwise negative.  Past Medical History:  Diagnosis Date   Aortic valve insufficiency    Arthritis    GERD (gastroesophageal reflux disease)    Mitral valve disorder    Stress fracture of cervical vertebra    Trichimoniasis 08/13/2021   08/13/21 treated with flagyl .    Past Surgical History:  Procedure Laterality Date   DILATATION AND CURETTAGE/HYSTEROSCOPY WITH MINERVA N/A 04/24/2022   Procedure: DILATATION AND CURETTAGE/HYSTEROSCOPY WITH MINERVA;  Surgeon: Jayne Vonn DEL, MD;  Location: [facility];  Service: Gynecology;  Laterality: N/A;   ESSURE TUBAL LIGATION Bilateral    LAPAROSCOPIC BILATERAL SALPINGECTOMY N/A 04/24/2022   Procedure: LAPAROSCOPIC BILATERAL SALPINGECTOMY; removal foreign object;  Surgeon: Jayne Vonn DEL, MD;   Location: [facility];  Service: Gynecology;  Laterality: N/A;   TUBAL LIGATION       reports that she has quit smoking. Her smoking use included cigarettes. She has never used smokeless tobacco. She reports that she does not currently use alcohol. She reports that she does not use drugs.  Allergies  Allergen Reactions   Bee Venom Swelling   Onion Rash   Tape Rash    Family History  Problem Relation Age of Onset   Thyroid  disease Mother    Hypertension Father        AVR   Heart murmur Father    Hypercholesterolemia Sister    Thyroid  disease Sister    Diabetes type I Daughter    Thyroid  disease Paternal Aunt    Thyroid  disease Paternal Uncle    Hypertension Maternal Grandmother    Colon cancer Paternal Grandfather    Breast cancer Neg Hx    Cervical cancer Neg Hx     Prior to Admission medications   Medication Sig Start Date End Date Taking? Authorizing Provider  Bacillus Coagulans-Inulin (PROBIOTIC-PREBIOTIC PO) Take by mouth.    [provider]  Cetirizine  HCl (ZYRTEC  PO) Take by mouth.    [provider]  cyclobenzaprine  (FLEXERIL ) 5 MG tablet Take 5 mg by mouth daily. 09/05/23   [provider]  Drospirenone  (SLYND ) 4 MG TABS Take 1 tablet (4 mg total) by mouth daily. 12/23/22   Signa Delon LABOR, NP  gabapentin  (NEURONTIN ) 100 MG capsule TAKE 2 CAPSULES BY MOUTH AT NIGHT 06/02/23   Margrette Taft BRAVO, MD  ketoconazole  (NIZORAL ) 2 % shampoo Apply 1 Application topically 2 (two) times a week. 06/12/23   Zarwolo, Gloria, FNP  montelukast  (SINGULAIR ) 10 MG tablet TAKE 1 TABLET BY MOUTH EVERYDAY AT BEDTIME 09/22/23   Zarwolo, Gloria, FNP  mupirocin  ointment (BACTROBAN ) 2 % Apply 1 Application topically 2 (two) times daily. 09/24/23   Zarwolo, Gloria, FNP  oxyCODONE -acetaminophen  (PERCOCET/ROXICET) 5-325 MG tablet SMARTSIG:1 Tablet(s) By Mouth Every 12 Hours 10/03/23   [provider]  pantoprazole  (PROTONIX ) 40 MG tablet Take 1 tablet (40 mg  total) by mouth 2 (two) times daily. 09/03/23 01/01/24  Zarwolo, Gloria, FNP  predniSONE  (STERAPRED UNI-PAK 21 TAB) 5 MG (21) TBPK tablet Take 1 tablet (5 mg total) by mouth as directed. Use as directed 10/28/23   Antonetta Rollene BRAVO, MD  promethazine -dextromethorphan (PROMETHAZINE -DM) 6.25-15 MG/5ML syrup Take 5 mLs by mouth 4 (four) times daily as needed. 10/22/23   Zarwolo, Gloria, FNP  traZODone  (DESYREL ) 150 MG tablet TAKE 1 TABLET BY MOUTH EVERYDAY AT BEDTIME 10/17/23   Zarwolo, Gloria, FNP  venlafaxine  XR (EFFEXOR -XR) 75 MG 24 hr capsule TAKE 1 CAPSULE BY MOUTH DAILY WITH BREAKFAST. 11/13/23   Signa Delon LABOR, NP  diphenhydrAMINE  (BENADRYL ) 25 MG tablet Take 25 mg by mouth every 6 (six) hours as needed.  05/14/13  [provider]    Physical Exam: Vitals:   11/22/23 1005 11/22/23 1245 11/22/23 1345 11/22/23 1400  BP:  (!) 166/93 (!) 125/93 130/84  Pulse:  65 81 86  Resp:  (!) 27 12 19   Temp: 99 F (37.2 C)     TempSrc: Temporal     SpO2:  98% 99% 99%  Weight:      Height:        Constitutional: NAD, calm, comfortable Vitals:   11/22/23 1005 11/22/23 1245 11/22/23 1345 11/22/23 1400  BP:  (!) 166/93 (!) 125/93 130/84  Pulse:  65 81 86  Resp:  (!) 27 12 19   Temp: 99 F (37.2 C)     TempSrc: Temporal     SpO2:  98% 99% 99%  Weight:      Height:       Eyes: lids and conjunctivae normal Neck: normal, supple Respiratory: clear to auscultation bilaterally. Normal respiratory effort. No accessory muscle use.  Cardiovascular: Regular rate and rhythm, no murmurs. Abdomen: Tender to palpation over right upper quadrant, no distention. Bowel sounds positive.  Musculoskeletal:  No edema. Skin: no rashes, lesions, ulcers.  Psychiatric: Flat affect  Labs on Admission: I have personally reviewed following labs and imaging studies  CBC: Recent Labs  Lab 11/19/23 1819 11/22/23 1140  WBC 6.1 11.1*  HGB 13.9 15.0  HCT 41.4 43.3  MCV 89.6 87.1  PLT 280 269   Basic  Metabolic Panel: Recent Labs  Lab 11/19/23 1819 11/22/23 1140  NA 135 135  K 4.0 4.3  CL 106 106  CO2 21* 21*  GLUCOSE 94 159*  BUN 9 8  CREATININE 0.72 0.68  CALCIUM 9.2 9.2   GFR: Estimated Creatinine Clearance: 87.8 mL/min (by C-G formula based on SCr of 0.68 mg/dL). Liver Function Tests: Recent Labs  Lab 11/22/23 1140  AST 1,406*  ALT 1,192*  ALKPHOS 145*  BILITOT 2.9*  PROT 7.8  ALBUMIN 4.1   Recent Labs  Lab 11/22/23 1140  LIPASE 3,303*   No results for input(s): AMMONIA in the last 168 hours. Coagulation Profile: No results for input(s): INR, PROTIME in the last 168 hours. Cardiac Enzymes: No results for input(s): CKTOTAL,  CKMB, CKMBINDEX, TROPONINI in the last 168 hours. BNP (last 3 results) No results for input(s): PROBNP in the last 8760 hours. HbA1C: No results for input(s): HGBA1C in the last 72 hours. CBG: No results for input(s): GLUCAP in the last 168 hours. Lipid Profile: No results for input(s): CHOL, HDL, LDLCALC, TRIG, CHOLHDL, LDLDIRECT in the last 72 hours. Thyroid  Function Tests: No results for input(s): TSH, T4TOTAL, FREET4, T3FREE, THYROIDAB in the last 72 hours. Anemia Panel: No results for input(s): VITAMINB12, FOLATE, FERRITIN, TIBC, IRON, RETICCTPCT in the last 72 hours. Urine analysis:    Component Value Date/Time   COLORURINE YELLOW 11/22/2023 0941   APPEARANCEUR CLEAR 11/22/2023 0941   LABSPEC 1.041 (H) 11/22/2023 0941   PHURINE 5.0 11/22/2023 0941   GLUCOSEU NEGATIVE 11/22/2023 0941   HGBUR NEGATIVE 11/22/2023 0941   BILIRUBINUR NEGATIVE 11/22/2023 0941   KETONESUR 5 (A) 11/22/2023 0941   PROTEINUR NEGATIVE 11/22/2023 0941   UROBILINOGEN 0.2 01/08/2015 0148   NITRITE NEGATIVE 11/22/2023 0941   LEUKOCYTESUR NEGATIVE 11/22/2023 0941    Radiological Exams on Admission: CT ABDOMEN PELVIS W CONTRAST Result Date: 11/22/2023 CLINICAL DATA:  Acute nonlocalized abdominal  pain. Right-sided abdominal pain since Wednesday. Vomiting and nausea last night with sweats and bowel movements this morning. EXAM: CT ABDOMEN AND PELVIS WITH CONTRAST TECHNIQUE: Multidetector CT imaging of the abdomen and pelvis was performed using the standard protocol following bolus administration of intravenous contrast. RADIATION DOSE REDUCTION: This exam was performed according to the departmental dose-optimization program which includes automated exposure control, adjustment of the mA and/or kV according to patient size and/or use of iterative reconstruction technique. CONTRAST:  OMNIPAQUE  IOHEXOL  300 MG/ML  SOLN COMPARISON:  Ultrasound abdomen 11/22/2023. CT abdomen and pelvis 04/25/2022 FINDINGS: Lower chest: Dependent atelectasis in the lung bases. Hepatobiliary: Mild diffuse fatty infiltration of the liver with focal fatty infiltration in the left. Diffusely increased density in the gallbladder with multiple stones present as seen on prior ultrasound. Mild pericholecystic edema is nonspecific. No bile duct dilatation. Pancreas: Pancreatic head is enlarged and hypoenhancing with stranding around the pancreas, porta hepatis, and extending into the anterior pericolic gutters with fluid along the right pericolic gutter almost to the pelvis. Changes are likely to indicate acute interstitial edematous pancreatitis. No abscess or necrosis identified. Spleen: Normal in size without focal abnormality. Adrenals/Urinary Tract: Adrenal glands are unremarkable. Kidneys are normal, without renal calculi, focal lesion, or hydronephrosis. Bladder is unremarkable. Stomach/Bowel: Stomach, small bowel, and colon are not abnormally distended. No wall thickening or inflammatory changes are appreciated. Appendix is normal. Vascular/Lymphatic: No significant vascular findings are present. No enlarged abdominal or pelvic lymph nodes. Reproductive: Uterus and ovaries are not enlarged. Small left ovarian cysts measuring up  to about 1.6 cm diameter, likely physiologic. No imaging follow-up is indicated. Other: No free air in the abdomen. Abdominal wall musculature appears intact. Musculoskeletal: No acute or significant osseous findings. IMPRESSION: 1. Inflammatory changes centered at the head of the pancreas with stranding and fluid tracking along the pericolic gutters, more prominent on the right. Changes are consistent with acute interstitial edematous pancreatitis. No abscess or necrosis identified. 2. Cholelithiasis. Mild pericholecystic edema is likely reactive. No bile duct dilatation. 3. Left ovarian simple-appearing cyst measuring 1.6 cm. No follow-up imaging is recommended. Reference: JACR 2020 Feb;17(2):248-254 Electronically Signed   By: Elsie Gravely M.D.   On: 11/22/2023 13:11   US  Abdomen Limited RUQ (LIVER/GB) Result Date: 11/22/2023 CLINICAL DATA:  RIGHT upper quadrant pain.  Nausea and vomiting.  EXAM: ULTRASOUND ABDOMEN LIMITED RIGHT UPPER QUADRANT COMPARISON:  CT abdomen 04/25/2022 FINDINGS: Gallbladder: There is dense posterior shadowing obscuring much of the gallbladder with hyper echogenic superficial wall (so-called wall echo sign). Findings are consistent with multiple gallstones packed within lumen the gallbladder. Gallbladder wall is mildly thickened 7 mm. No clear sonographic Murphy's sign however sonography reports generalized abdominal pain. Common bile duct: Diameter: Upper limits of normal at 7 mm. Liver: No focal lesion identified. Within normal limits in parenchymal echogenicity. Portal vein is patent on color Doppler imaging with normal direction of blood flow towards the liver. Other: None. IMPRESSION: 1. Multiple gallstones pack the lumen of the gallbladder without clear evidence acute cholecystitis. 2. Common bile duct upper limits of normal. 3. No clear sonographic Murphy's sign. 4. Additional imaging with CT or MRI/MRCP may be appropriate. Electronically Signed   By: Jackquline Boxer M.D.    On: 11/22/2023 11:16    EKG: Independently reviewed. SR 64bpm.  Assessment/Plan Principal Problem:   Acute pancreatitis Active Problems:   Irregular periods   Moody   Depression, major, single episode, moderate (HCC)   Class 1 obesity due to excess calories without serious comorbidity with body mass index (BMI) of 30.0 to 30.9 in adult   Aortic regurgitation    Acute pancreatitis and cholelithiasis with transaminitis -2 L IV fluid bolus ordered -Continue aggressive IV fluid -IV morphine  as needed for pain management -Zofran  for nausea and vomiting -Keep n.p.o. except sips and medications for now -Check lipid panel -Continue to monitor repeat labs -Appreciate GI evaluation for further recommendations on imaging -Likely will require cholecystectomy inpatient  Anxiety/depression -Continue home trazodone  and venlafaxine   GERD -IV PPI  Perimenopausal -Needs to continue drospirenone  4 mg daily for ongoing symptoms  Obesity, class I -BMI 30.91   DVT prophylaxis: Heparin  Code Status: Full Family Communication: Roommate at bedside 1/11 Disposition Plan:Admit for acute pancreatitis Consults called:GI Admission status:Inpatient, Tele   Severity of Illness: The appropriate patient status for this patient is INPATIENT. Inpatient status is judged to be reasonable and necessary in order to provide the required intensity of service to ensure the patient's safety. The patient's presenting symptoms, physical exam findings, and initial radiographic and laboratory data in the context of their chronic comorbidities is felt to place them at high risk for further clinical deterioration. Furthermore, it is not anticipated that the patient will be medically stable for discharge from the hospital within 2 midnights of admission.   * I certify that at the point of admission it is my clinical judgment that the patient will require inpatient hospital care spanning beyond 2 midnights from the  point of admission due to high intensity of service, high risk for further deterioration and high frequency of surveillance required.*   Pau Banh D Kendria Halberg DO [facility]  If 7PM-7AM, please contact night-coverage [email or web address]  11/22/2023, 2:27 PM

## 2023-11-22 NOTE — Progress Notes (Signed)
   11/22/23 1715  TOC Brief Assessment  Insurance and Status Reviewed  Patient has primary care physician Yes  Home environment has been reviewed From Home  Prior level of function: Independent  Prior/Current Home Services No current home services  Social Drivers of Health Review SDOH reviewed no interventions necessary  Readmission risk has been reviewed Yes  Transition of care needs no transition of care needs at this time     Transition of Care Department [facility]) has reviewed patient and no TOC needs have been identified at this time. We will continue to monitor patient advancement through interdisciplinary progression rounds. If new patient transition needs arise, please place a TOC consult.

## 2023-11-22 NOTE — Progress Notes (Signed)
 Pt arrived to room 328 via WC from ED. Pt ambulatory from chair to bed, gait stable, requires no assistance. VSS, pt rates RUQ abd and back pain 5/10 after receiving IV morphine  in ED just prior to admission. IVF bolus continues to infuse without s/s infiltration. No nausea at present. Pt oriented to room and safety procedures, states understanding.

## 2023-11-22 NOTE — Plan of Care (Signed)

## 2023-11-22 NOTE — ED Notes (Signed)
 [place] at bedside

## 2023-11-22 NOTE — ED Notes (Signed)
 Attempted to call report to Crystal, she will call me back when she can.

## 2023-11-22 NOTE — ED Notes (Signed)
 Morphine  4 Mg was taken from [facility] med pyxis and wasted with Jordan and 2 Mg was given to Pt. Orders were not linked and it would not let me scan the vial because it stated it was the wrong medication. It was the correct medication, just 2 MG were wasted. 0.5 Ml were given before pt transported upstairs to room 328.

## 2023-11-22 NOTE — ED Provider Notes (Addendum)
 [facility] AT [facility] Provider Note   CSN: [id number] Arrival date & time: 11/22/23  9072     History  Chief Complaint  Patient presents with   Abdominal Pain    Kristina Delacruz is a 43 y.o. female who presents with abdominal pain since Wednesday that has significantly worsened since last night.  Endorses emesis without diarrhea.  Describes generalized abdominal pain, worse right upper quadrant.  No urinary symptoms.  No flank pain.  Endorses chills without objective fever.  No cough or congestion.  Was seen here on 1/8 for chest pain and shortness of breath.  Workup unremarkable at that time.   Abdominal Pain      Home Medications Prior to Admission medications   Medication Sig Start Date End Date Taking? Authorizing Provider  Bacillus Coagulans-Inulin (PROBIOTIC-PREBIOTIC PO) Take 1 capsule by mouth daily.   Yes [provider]  calcium carbonate (TUMS - DOSED IN MG ELEMENTAL CALCIUM) 500 MG chewable tablet Chew 1 tablet by mouth daily as needed for indigestion or heartburn.   Yes [provider]  Cetirizine  HCl (ZYRTEC  PO) Take 1 tablet by mouth daily.   Yes [provider]  cyclobenzaprine  (FLEXERIL ) 5 MG tablet Take 5 mg by mouth daily. 09/05/23  Yes [provider]  Drospirenone  (SLYND ) 4 MG TABS Take 1 tablet (4 mg total) by mouth daily. 12/23/22  Yes Signa Nest A, NP  gabapentin  (NEURONTIN ) 100 MG capsule TAKE 2 CAPSULES BY MOUTH AT NIGHT 06/02/23  Yes Margrette Taft BRAVO, MD  MAGNESIUM PO Take 1 tablet by mouth daily as needed (acid reflux).   Yes [provider]  montelukast  (SINGULAIR ) 10 MG tablet TAKE 1 TABLET BY MOUTH EVERYDAY AT BEDTIME 09/22/23  Yes Zarwolo, Gloria, FNP  oxyCODONE -acetaminophen  (PERCOCET/ROXICET) 5-325 MG tablet Take 1 tablet by mouth daily as needed for moderate pain (pain score 4-6). 10/03/23  Yes [provider]  pantoprazole  (PROTONIX ) 40 MG tablet  Take 1 tablet (40 mg total) by mouth 2 (two) times daily. 09/03/23 01/01/24 Yes Zarwolo, Gloria, FNP  RESTASIS  0.05 % ophthalmic emulsion Place 1 drop into both eyes 2 (two) times daily. 10/17/23  Yes [provider]  traZODone  (DESYREL ) 150 MG tablet TAKE 1 TABLET BY MOUTH EVERYDAY AT BEDTIME 10/17/23  Yes Zarwolo, Gloria, FNP  venlafaxine  XR (EFFEXOR -XR) 75 MG 24 hr capsule TAKE 1 CAPSULE BY MOUTH DAILY WITH BREAKFAST. 11/13/23  Yes Signa Nest LABOR, NP  diphenhydrAMINE  (BENADRYL ) 25 MG tablet Take 25 mg by mouth every 6 (six) hours as needed.  05/14/13  [provider]      Allergies    Bee venom, Fentanyl , Onion, and Tape    Review of Systems   Review of Systems  Gastrointestinal:  Positive for abdominal pain.    Physical Exam Updated Vital Signs BP 130/84   Pulse 86   Temp 99 F (37.2 C) (Temporal)   Resp 19   Ht 5' 2 (1.575 m)   Wt 76.7 kg   SpO2 99%   BMI 30.91 kg/m  Physical Exam Vitals and nursing note reviewed.  Constitutional:      General: She is in acute distress.     Appearance: She is well-developed.  HENT:     Head: Normocephalic and atraumatic.  Eyes:     Conjunctiva/sclera: Conjunctivae normal.  Cardiovascular:     Rate and Rhythm: Normal rate and regular rhythm.     Heart sounds: No murmur heard. Pulmonary:  Effort: Pulmonary effort is normal. No respiratory distress.     Breath sounds: Normal breath sounds.  Abdominal:     Palpations: Abdomen is soft.     Tenderness: There is abdominal tenderness. There is right CVA tenderness, left CVA tenderness and guarding. Positive signs include Murphy's sign.     Comments: Generalized abdominal tenderness, worse right upper quadrant and right lower quadrant  Musculoskeletal:        General: No swelling.     Cervical back: Neck supple.  Skin:    General: Skin is warm and dry.     Capillary Refill: Capillary refill takes less than 2 seconds.  Neurological:     Mental Status: She is alert.   Psychiatric:        Mood and Affect: Mood normal.     ED Results / Procedures / Treatments   Labs (all labs ordered are listed, but only abnormal results are displayed) Labs Reviewed  LIPASE, BLOOD - Abnormal; Notable for the following components:      Result Value   Lipase 3,303 (*)    All other components within normal limits  COMPREHENSIVE METABOLIC PANEL - Abnormal; Notable for the following components:   CO2 21 (*)    Glucose, Bld 159 (*)    AST 1,406 (*)    ALT 1,192 (*)    Alkaline Phosphatase 145 (*)    Total Bilirubin 2.9 (*)    All other components within normal limits  CBC - Abnormal; Notable for the following components:   WBC 11.1 (*)    All other components within normal limits  URINALYSIS, ROUTINE W REFLEX MICROSCOPIC - Abnormal; Notable for the following components:   Specific Gravity, Urine 1.041 (*)    Ketones, ur 5 (*)    All other components within normal limits  PREGNANCY, URINE  LIPID PANEL    EKG None  Radiology CT ABDOMEN PELVIS W CONTRAST Result Date: 11/22/2023 CLINICAL DATA:  Acute nonlocalized abdominal pain. Right-sided abdominal pain since Wednesday. Vomiting and nausea last night with sweats and bowel movements this morning. EXAM: CT ABDOMEN AND PELVIS WITH CONTRAST TECHNIQUE: Multidetector CT imaging of the abdomen and pelvis was performed using the standard protocol following bolus administration of intravenous contrast. RADIATION DOSE REDUCTION: This exam was performed according to the departmental dose-optimization program which includes automated exposure control, adjustment of the mA and/or kV according to patient size and/or use of iterative reconstruction technique. CONTRAST:  OMNIPAQUE  IOHEXOL  300 MG/ML  SOLN COMPARISON:  Ultrasound abdomen 11/22/2023. CT abdomen and pelvis 04/25/2022 FINDINGS: Lower chest: Dependent atelectasis in the lung bases. Hepatobiliary: Mild diffuse fatty infiltration of the liver with focal fatty  infiltration in the left. Diffusely increased density in the gallbladder with multiple stones present as seen on prior ultrasound. Mild pericholecystic edema is nonspecific. No bile duct dilatation. Pancreas: Pancreatic head is enlarged and hypoenhancing with stranding around the pancreas, porta hepatis, and extending into the anterior pericolic gutters with fluid along the right pericolic gutter almost to the pelvis. Changes are likely to indicate acute interstitial edematous pancreatitis. No abscess or necrosis identified. Spleen: Normal in size without focal abnormality. Adrenals/Urinary Tract: Adrenal glands are unremarkable. Kidneys are normal, without renal calculi, focal lesion, or hydronephrosis. Bladder is unremarkable. Stomach/Bowel: Stomach, small bowel, and colon are not abnormally distended. No wall thickening or inflammatory changes are appreciated. Appendix is normal. Vascular/Lymphatic: No significant vascular findings are present. No enlarged abdominal or pelvic lymph nodes. Reproductive: Uterus and ovaries are not enlarged.  Small left ovarian cysts measuring up to about 1.6 cm diameter, likely physiologic. No imaging follow-up is indicated. Other: No free air in the abdomen. Abdominal wall musculature appears intact. Musculoskeletal: No acute or significant osseous findings. IMPRESSION: 1. Inflammatory changes centered at the head of the pancreas with stranding and fluid tracking along the pericolic gutters, more prominent on the right. Changes are consistent with acute interstitial edematous pancreatitis. No abscess or necrosis identified. 2. Cholelithiasis. Mild pericholecystic edema is likely reactive. No bile duct dilatation. 3. Left ovarian simple-appearing cyst measuring 1.6 cm. No follow-up imaging is recommended. Reference: JACR 2020 Feb;17(2):248-254 Electronically Signed   By: Elsie Gravely M.D.   On: 11/22/2023 13:11   US  Abdomen Limited RUQ (LIVER/GB) Result Date:  11/22/2023 CLINICAL DATA:  RIGHT upper quadrant pain.  Nausea and vomiting. EXAM: ULTRASOUND ABDOMEN LIMITED RIGHT UPPER QUADRANT COMPARISON:  CT abdomen 04/25/2022 FINDINGS: Gallbladder: There is dense posterior shadowing obscuring much of the gallbladder with hyper echogenic superficial wall (so-called wall echo sign). Findings are consistent with multiple gallstones packed within lumen the gallbladder. Gallbladder wall is mildly thickened 7 mm. No clear sonographic Murphy's sign however sonography reports generalized abdominal pain. Common bile duct: Diameter: Upper limits of normal at 7 mm. Liver: No focal lesion identified. Within normal limits in parenchymal echogenicity. Portal vein is patent on color Doppler imaging with normal direction of blood flow towards the liver. Other: None. IMPRESSION: 1. Multiple gallstones pack the lumen of the gallbladder without clear evidence acute cholecystitis. 2. Common bile duct upper limits of normal. 3. No clear sonographic Murphy's sign. 4. Additional imaging with CT or MRI/MRCP may be appropriate. Electronically Signed   By: Jackquline Boxer M.D.   On: 11/22/2023 11:16    Procedures Procedures    Medications Ordered in ED Medications  venlafaxine  XR (EFFEXOR -XR) 24 hr capsule 75 mg (has no administration in time range)  traZODone  (DESYREL ) tablet 150 mg (has no administration in time range)  Drospirenone  TABS 4 mg (has no administration in time range)  cycloSPORINE  (RESTASIS ) 0.05 % ophthalmic emulsion 1 drop (has no administration in time range)  heparin  injection 5,000 Units (has no administration in time range)  0.9 %  sodium chloride  infusion (has no administration in time range)  morphine  (PF) 2 MG/ML injection 2 mg (has no administration in time range)  ondansetron  (ZOFRAN ) tablet 4 mg (has no administration in time range)    Or  ondansetron  (ZOFRAN ) injection 4 mg (has no administration in time range)  ondansetron  (ZOFRAN ) injection 4 mg (4 mg  Intravenous Given 11/22/23 1136)  alum & mag hydroxide-simeth (MAALOX/MYLANTA) 200-200-20 MG/5ML suspension 30 mL (30 mLs Oral Given 11/22/23 1053)  dicyclomine  (BENTYL ) capsule 10 mg (10 mg Oral Given 11/22/23 1053)  morphine  (PF) 4 MG/ML injection 4 mg (4 mg Intravenous Given 11/22/23 1136)  morphine  (PF) 4 MG/ML injection 4 mg (4 mg Intravenous Given 11/22/23 1333)  ketorolac  (TORADOL ) 15 MG/ML injection 15 mg (15 mg Intravenous Given 11/22/23 1331)  iohexol  (OMNIPAQUE ) 300 MG/ML solution 100 mL (100 mLs Intravenous Contrast Given 11/22/23 1256)  sodium chloride  0.9 % bolus 2,000 mL (2,000 mLs Intravenous New Bag/Given 11/22/23 1419)    ED Course/ Medical Decision Making/ A&P                                 Medical Decision Making Amount and/or Complexity of Data Reviewed Labs: ordered. Radiology: ordered.  Risk OTC drugs.  Prescription drug management. Decision regarding hospitalization.   This patient presents to the ED with chief complaint(s) of ABD pain.  The complaint involves an extensive differential diagnosis and also carries with it a high risk of complications and morbidity.   pertinent past medical history as listed in HPI  The differential diagnosis includes  Cholecystitis, appendicitis, diverticulitis, nephrolithiasis, gastroenteritis, AAA, mesenteric ischemia The initial plan is to  Will start with basic labs and imaging Additional history obtained: Additional history obtained from EMS  Records reviewed previous admission documents  Initial Assessment:   Patient is uncomfortable and nauseous but nontoxic appearing,  afebrile, mildly tachypneic 21, mildly hypertensive 145/87, she is generalized abdominal tenderness with guarding, worse right upper quadrant.   Independent ECG interpretation:  Sinus rhythm without ischemic changes  Independent labs interpretation:  The following labs were independently interpreted:  CBC with mild leukocytosis of 11.1, CMP notable for  transaminitis of AST with 1400 and ALT of 1100, alk phos 145 and total bili of 2.9.  Lipase significantly elevated 3300, UA with elevated specific gravity  Independent visualization and interpretation of imaging: I independently visualized the following imaging with scope of interpretation limited to determining acute life threatening conditions related to emergency care:  RUQ US , which revealed multiple gall stones without evidence of cholecystitis.   Treatment and Reassessment: Patient given GI cocktail, Zofran  and 4 mg of morphine  following first assessment.  Started on fluid bolus.  Consultations obtained:   GI Casteneda returned call at 1:15pm recommended hospitalist admission, monitor LFTs, pain and nausea management. If continues to elevate will consider MRCP Hospitalist Dr Maree: received return call at 1:33, agreed to admit for pancreatitis and to discuss patient with general surgery.  Disposition:   Patient will be admitted for pancreatitis, likely gallstone related.  Patient is agreeable to this.  Social Determinants of Health:   none  This note was dictated with voice recognition software.  Despite best efforts at proofreading, errors may have occurred which can change the documentation meaning.          Final Clinical Impression(s) / ED Diagnoses Final diagnoses:  Acute pancreatitis, unspecified complication status, unspecified pancreatitis type    Rx / DC Orders ED Discharge Orders     None         Donnajean Lynwood DEL, PA-C 11/22/23 1518    Donnajean Lynwood DEL, PA-C 11/22/23 1521    Suzette Pac, MD 11/24/23 1456

## 2023-11-22 NOTE — Consult Note (Signed)
 [name], M.D. Gastroenterology & Hepatology                                           Patient Name: Kristina Delacruz Account #: @[id number] @   MRN: [id number] Admission Date: 11/22/2023 Date of Evaluation:  11/22/2023 Time of Evaluation: 1:17 PM   Referring Physician: Lynwood Roosevelt. PA-C  Chief Complaint: Acute pancreatitis  HPI:  This is a 43 y.o. female with history of arctic valve insufficiency, GERD, mitral valve insufficiency, anxiety, who came to the hospital after presenting worsening abdominal pain, nausea and vomiting.  Gastroenterology was consulted for evaluation of acute pancreatitis.  Patient reports that for the last 5 years she has presented intermittent episodes of upper abdominal pain and feeling cold and clammy with nausea when this occurs.  These episodes have been intermittent and last for a few days.  They may happen every 4 to 5 months but are never severe enough to have an evaluation in the ER.  However, she reports that on Wednesday morning she presented recurrent pain in her upper abdomen that radiated to her back.  Reports that the pain worsened through the days and it was severe yesterday so she decided to come to the ER.  She also presented recurrent episodes of nausea and vomiting multiple episodes.  No fever but had some chills.  No melena, hematochezia, changes in weight, jaundice, changes in urine.  The patient came to the ER on 11/18/2022.  She was evaluated for precordial pain and was discharged home but given recurrent persistent symptoms she decided to come to the ER again today.  The patient states that she has not had any recent changes in her medication but only had increasing the dose of her Effexor  recently (2 months ago).  Patient does not drink alcohol or smoke.  No use of drugs.  Grandfather has history of prostate cancer.  Surgical history remarkable for tubal ligation.  In the ED, he was HD stable and afebrile. Labs were remarkable for lipase  3303, AST 1406, ALT 1192, total bilirubin 2.9, alkaline phosphatase 145, normal renal function and electrolytes with calcium of 9.2.  CBC with WBC 11.1, rest within normal limits.  Ultrasound of the abdomen/right upper quadrant showed multiple gallstones in the gallbladder without cholecystitis, normal CBD.  A CT of the abdomen and pelvis with IV contrast showed inflammation at the head of the pancreas with stranding and fluid tracking in the pericolic gutters consistent with interstitial pancreatitis, cholelithiasis.  Normal CBD.   Past Medical History: SEE CHRONIC ISSSUES: Past Medical History:  Diagnosis Date   Aortic valve insufficiency    Arthritis    GERD (gastroesophageal reflux disease)    Mitral valve disorder    Stress fracture of cervical vertebra    Trichimoniasis 08/13/2021   08/13/21 treated with flagyl .   Past Surgical History:  Past Surgical History:  Procedure Laterality Date   DILATATION AND CURETTAGE/HYSTEROSCOPY WITH MINERVA N/A 04/24/2022   Procedure: DILATATION AND CURETTAGE/HYSTEROSCOPY WITH MINERVA;  Surgeon: Jayne Vonn DEL, MD;  Location: [facility];  Service: Gynecology;  Laterality: N/A;   ESSURE TUBAL LIGATION Bilateral    LAPAROSCOPIC BILATERAL SALPINGECTOMY N/A 04/24/2022   Procedure: LAPAROSCOPIC BILATERAL SALPINGECTOMY; removal foreign object;  Surgeon: Jayne Vonn DEL, MD;  Location: [facility];  Service: Gynecology;  Laterality: N/A;   TUBAL LIGATION     Family History:  Family  History  Problem Relation Age of Onset   Thyroid  disease Mother    Hypertension Father        AVR   Heart murmur Father    Hypercholesterolemia Sister    Thyroid  disease Sister    Diabetes type I Daughter    Thyroid  disease Paternal Aunt    Thyroid  disease Paternal Uncle    Hypertension Maternal Grandmother    Colon cancer Paternal Grandfather    Breast cancer Neg Hx    Cervical cancer Neg Hx    Social History:  Social History   Tobacco Use   Smoking status: Former    Types:  Cigarettes   Smokeless tobacco: Never  Vaping Use   Vaping status: Never Used  Substance Use Topics   Alcohol use: Not Currently   Drug use: No    Home Medications:  Prior to Admission medications   Medication Sig Start Date End Date Taking? Authorizing Provider  Bacillus Coagulans-Inulin (PROBIOTIC-PREBIOTIC PO) Take by mouth.    [provider]  Cetirizine  HCl (ZYRTEC  PO) Take by mouth.    [provider]  cyclobenzaprine  (FLEXERIL ) 5 MG tablet Take 5 mg by mouth daily. 09/05/23   [provider]  Drospirenone  (SLYND ) 4 MG TABS Take 1 tablet (4 mg total) by mouth daily. 12/23/22   Signa Delon LABOR, NP  gabapentin  (NEURONTIN ) 100 MG capsule TAKE 2 CAPSULES BY MOUTH AT NIGHT 06/02/23   Margrette Taft BRAVO, MD  ketoconazole  (NIZORAL ) 2 % shampoo Apply 1 Application topically 2 (two) times a week. 06/12/23   Zarwolo, Gloria, FNP  montelukast  (SINGULAIR ) 10 MG tablet TAKE 1 TABLET BY MOUTH EVERYDAY AT BEDTIME 09/22/23   Zarwolo, Gloria, FNP  mupirocin  ointment (BACTROBAN ) 2 % Apply 1 Application topically 2 (two) times daily. 09/24/23   Zarwolo, Gloria, FNP  oxyCODONE -acetaminophen  (PERCOCET/ROXICET) 5-325 MG tablet SMARTSIG:1 Tablet(s) By Mouth Every 12 Hours 10/03/23   [provider]  pantoprazole  (PROTONIX ) 40 MG tablet Take 1 tablet (40 mg total) by mouth 2 (two) times daily. 09/03/23 01/01/24  Zarwolo, Gloria, FNP  predniSONE  (STERAPRED UNI-PAK 21 TAB) 5 MG (21) TBPK tablet Take 1 tablet (5 mg total) by mouth as directed. Use as directed 10/28/23   Antonetta Rollene BRAVO, MD  promethazine -dextromethorphan (PROMETHAZINE -DM) 6.25-15 MG/5ML syrup Take 5 mLs by mouth 4 (four) times daily as needed. 10/22/23   Zarwolo, Gloria, FNP  traZODone  (DESYREL ) 150 MG tablet TAKE 1 TABLET BY MOUTH EVERYDAY AT BEDTIME 10/17/23   Zarwolo, Gloria, FNP  venlafaxine  XR (EFFEXOR -XR) 75 MG 24 hr capsule TAKE 1 CAPSULE BY MOUTH DAILY WITH BREAKFAST. 11/13/23   Signa Delon LABOR, NP   diphenhydrAMINE  (BENADRYL ) 25 MG tablet Take 25 mg by mouth every 6 (six) hours as needed.  05/14/13  [provider]    Inpatient Medications:  Current Facility-Administered Medications:    ketorolac  (TORADOL ) 15 MG/ML injection 15 mg, 15 mg, Intravenous, Once, Templeton, James H, PA-C   morphine  (PF) 4 MG/ML injection 4 mg, 4 mg, Intravenous, Once, Donnajean Lynwood DEL, PA-C  Current Outpatient Medications:    Bacillus Coagulans-Inulin (PROBIOTIC-PREBIOTIC PO), Take by mouth., Disp: , Rfl:    Cetirizine  HCl (ZYRTEC  PO), Take by mouth., Disp: , Rfl:    cyclobenzaprine  (FLEXERIL ) 5 MG tablet, Take 5 mg by mouth daily., Disp: , Rfl:    Drospirenone  (SLYND ) 4 MG TABS, Take 1 tablet (4 mg total) by mouth daily., Disp: 28 tablet, Rfl: 12   gabapentin  (NEURONTIN ) 100 MG capsule, TAKE 2 CAPSULES BY  MOUTH AT NIGHT, Disp: 60 capsule, Rfl: 4   ketoconazole  (NIZORAL ) 2 % shampoo, Apply 1 Application topically 2 (two) times a week., Disp: 120 mL, Rfl: 0   montelukast  (SINGULAIR ) 10 MG tablet, TAKE 1 TABLET BY MOUTH EVERYDAY AT BEDTIME, Disp: 30 tablet, Rfl: 3   mupirocin  ointment (BACTROBAN ) 2 %, Apply 1 Application topically 2 (two) times daily., Disp: 22 g, Rfl: 0   oxyCODONE -acetaminophen  (PERCOCET/ROXICET) 5-325 MG tablet, SMARTSIG:1 Tablet(s) By Mouth Every 12 Hours, Disp: , Rfl:    pantoprazole  (PROTONIX ) 40 MG tablet, Take 1 tablet (40 mg total) by mouth 2 (two) times daily., Disp: 60 tablet, Rfl: 3   predniSONE  (STERAPRED UNI-PAK 21 TAB) 5 MG (21) TBPK tablet, Take 1 tablet (5 mg total) by mouth as directed. Use as directed, Disp: 21 tablet, Rfl: 0   promethazine -dextromethorphan (PROMETHAZINE -DM) 6.25-15 MG/5ML syrup, Take 5 mLs by mouth 4 (four) times daily as needed., Disp: 118 mL, Rfl: 0   traZODone  (DESYREL ) 150 MG tablet, TAKE 1 TABLET BY MOUTH EVERYDAY AT BEDTIME, Disp: 90 tablet, Rfl: 3   venlafaxine  XR (EFFEXOR -XR) 75 MG 24 hr capsule, TAKE 1 CAPSULE BY MOUTH DAILY WITH BREAKFAST.,  Disp: 90 capsule, Rfl: 2 Allergies: Bee venom, Onion, and Tape  Complete Review of Systems: GENERAL: negative for malaise, night sweats HEENT: No changes in hearing or vision, no nose bleeds or other nasal problems. NECK: Negative for lumps, goiter, pain and significant neck swelling RESPIRATORY: Negative for cough, wheezing CARDIOVASCULAR: Negative for chest pain, leg swelling, palpitations, orthopnea GI: SEE HPI MUSCULOSKELETAL: Negative for joint pain or swelling, back pain, and muscle pain. SKIN: Negative for lesions, rash PSYCH: Negative for sleep disturbance, mood disorder and recent psychosocial stressors. HEMATOLOGY Negative for prolonged bleeding, bruising easily, and swollen nodes. ENDOCRINE: Negative for cold or heat intolerance, polyuria, polydipsia and goiter. NEURO: negative for tremor, gait imbalance, syncope and seizures. The remainder of the review of systems is noncontributory.  Physical Exam: BP (!) 145/87   Pulse 66   Temp 99 F (37.2 C) (Temporal)   Resp (!) 21   Ht 5' 2 (1.575 m)   Wt 76.7 kg   SpO2 100%   BMI 30.91 kg/m  GENERAL: The patient is AO x3, in no acute distress. HEENT: Head is normocephalic and atraumatic. EOMI are intact. Mouth is well hydrated and without lesions. NECK: Supple. No masses LUNGS: Clear to auscultation. No presence of rhonchi/wheezing/rales. Adequate chest expansion HEART: RRR, normal s1 and s2. ABDOMEN: diffusely tender, no guarding, no peritoneal signs, and nondistended. BS +. No masses. EXTREMITIES: Without any cyanosis, clubbing, rash, lesions or edema. NEUROLOGIC: AOx3, no focal motor deficit. SKIN: no jaundice, no rashes  Laboratory Data CBC:     Component Value Date/Time   WBC 11.1 (H) 11/22/2023 1140   RBC 4.97 11/22/2023 1140   HGB 15.0 11/22/2023 1140   HGB 13.2 10/28/2023 0940   HCT 43.3 11/22/2023 1140   HCT 38.8 10/28/2023 0940   PLT 269 11/22/2023 1140   PLT 303 10/28/2023 0940   MCV 87.1 11/22/2023  1140   MCV 88 10/28/2023 0940   MCH 30.2 11/22/2023 1140   MCHC 34.6 11/22/2023 1140   RDW 12.5 11/22/2023 1140   RDW 13.2 10/28/2023 0940   LYMPHSABS 1.2 10/28/2023 0940   MONOABS 0.7 01/08/2015 0200   EOSABS 0.1 10/28/2023 0940   BASOSABS 0.0 10/28/2023 0940   COAG: No results found for: INR, PROTIME  BMP:     Latest Ref Rng & Units  11/22/2023   11:40 AM 11/19/2023    6:19 PM 08/26/2023    8:43 AM  BMP  Glucose 70 - 99 mg/dL 840  94  99   BUN 6 - 20 mg/dL 8  9  9    Creatinine 0.44 - 1.00 mg/dL 9.31  9.27  9.05   BUN/Creat Ratio 9 - 23   10   Sodium 135 - 145 mmol/L 135  135  140   Potassium 3.5 - 5.1 mmol/L 4.3  4.0  4.1   Chloride 98 - 111 mmol/L 106  106  108   CO2 22 - 32 mmol/L 21  21  20    Calcium 8.9 - 10.3 mg/dL 9.2  9.2  9.5     HEPATIC:     Latest Ref Rng & Units 11/22/2023   11:40 AM 08/26/2023    8:43 AM 05/23/2023   10:11 AM  Hepatic Function  Total Protein 6.5 - 8.1 g/dL 7.8  7.1  6.8   Albumin 3.5 - 5.0 g/dL 4.1  4.3  4.2   AST 15 - 41 U/L 1,406  14  22   ALT 0 - 44 U/L 1,192  10  16   Alk Phosphatase 38 - 126 U/L 145  70  72   Total Bilirubin 0.0 - 1.2 mg/dL 2.9  0.4  0.4     CARDIAC: No results found for: CKTOTAL, CKMB, CKMBINDEX, TROPONINI   Imaging: I personally reviewed and interpreted the available imaging.  Assessment & Plan: AIRABELLA BARLEY is a 43 y.o. female with history of arctic valve insufficiency, GERD, mitral valve insufficiency, anxiety, who came to the hospital after presenting worsening abdominal pain, nausea and vomiting.  Gastroenterology was consulted for evaluation of acute pancreatitis.  Patient has presented intermittent episodes of abdominal pain which appear to be related to biliary colic.  Her imaging was consistent with presence of multiple gallstones and changes consistent with pancreatitis.  Notably, her CBD was completely normal.  However, she had large increase in her aminotransferases and mildly increasing  total bilirubin and alkaline phosphatase.  I suspect that she may have passed a stone but this led to acute pancreatitis.  We had a thorough discussion with the patient and her family member regarding the possible etiology of her pancreatitis.  She does not drink any alcohol and her calcium was normal.  Will check her 2 glycerides in the morning for completion of her workup.  I discussed with the patient that she may require an eventual cholecystectomy to prevent recurrent episodes of pancreatitis.  She understood this.  General surgery can be consulted not emergently to discuss this.  For now, we will advance her diet slowly to clear liquid diet and she will be given conservative management with IV fluids, pain control and antiemetics as needed.  - Start clear liquid diet and advance diet as tolerated.  If patient is having recurrent vomiting or worsening abdominal pain, we will need to put him n.p.o. -Start lactated ringer at least@ 150 cc/h -Pain control by primary team -Zofran  4 mg every 6 hours as needed for nausea, can add Reglan as needed if nausea persists - Non emergent surgical evaluation for eventual cholecystectomy - Check TGC levels  [name], MD Gastroenterology and Hepatology [facility] Gastroenterology

## 2023-11-23 DIAGNOSIS — K859 Acute pancreatitis without necrosis or infection, unspecified: Secondary | ICD-10-CM | POA: Diagnosis not present

## 2023-11-23 DIAGNOSIS — K851 Biliary acute pancreatitis without necrosis or infection: Secondary | ICD-10-CM | POA: Diagnosis not present

## 2023-11-23 LAB — COMPREHENSIVE METABOLIC PANEL
ALT: 719 U/L — ABNORMAL HIGH (ref 0–44)
AST: 369 U/L — ABNORMAL HIGH (ref 15–41)
Albumin: 3.5 g/dL (ref 3.5–5.0)
Alkaline Phosphatase: 119 U/L (ref 38–126)
Anion gap: 9 (ref 5–15)
BUN: 6 mg/dL (ref 6–20)
CO2: 21 mmol/L — ABNORMAL LOW (ref 22–32)
Calcium: 8.3 mg/dL — ABNORMAL LOW (ref 8.9–10.3)
Chloride: 106 mmol/L (ref 98–111)
Creatinine, Ser: 0.55 mg/dL (ref 0.44–1.00)
GFR, Estimated: >60 mL/min (ref >60)
Glucose, Bld: 93 mg/dL (ref 70–99)
Potassium: 3.6 mmol/L (ref 3.5–5.1)
Sodium: 136 mmol/L (ref 135–145)
Total Bilirubin: 1.2 mg/dL (ref 0.0–1.2)
Total Protein: 6.5 g/dL (ref 6.5–8.1)

## 2023-11-23 LAB — CBC
HCT: 40.7 % (ref 36.0–46.0)
Hemoglobin: 13.4 g/dL (ref 12.0–15.0)
MCH: 29.8 pg (ref 26.0–34.0)
MCHC: 32.9 g/dL (ref 30.0–36.0)
MCV: 90.6 fL (ref 80.0–100.0)
Platelets: 226 10*3/uL (ref 150–400)
RBC: 4.49 MIL/uL (ref 3.87–5.11)
RDW: 13.2 % (ref 11.5–15.5)
WBC: 8.1 10*3/uL (ref 4.0–10.5)
nRBC: 0 % (ref 0.0–0.2)

## 2023-11-23 LAB — MAGNESIUM: Magnesium: 2 mg/dL (ref 1.7–2.4)

## 2023-11-23 LAB — LIPASE, BLOOD: Lipase: 569 U/L — ABNORMAL HIGH (ref 11–51)

## 2023-11-23 LAB — TRIGLYCERIDES: Triglycerides: 61 mg/dL (ref <150)

## 2023-11-23 NOTE — Progress Notes (Signed)
 Kristina Delacruz, M.D. Gastroenterology & Hepatology   Interval History:  Patient reports that her abdominal pain has improved.  No vomiting with very mild nausea.  No fever or chills.  Inpatient Medications:  Current Facility-Administered Medications:    0.9 %  sodium chloride  infusion, , Intravenous, Continuous, Maree, Pratik D, DO, Last Rate: 150 mL/hr at 11/23/23 0743, New Bag at 11/23/23 0743   cycloSPORINE  (RESTASIS ) 0.05 % ophthalmic emulsion 1 drop, 1 drop, Both Eyes, BID, Maree, Pratik D, DO, 1 drop at 11/22/23 2110   Drospirenone  TABS 4 mg, 1 tablet, Oral, Daily, Maree, Pratik D, DO, 4 mg at 11/22/23 1910   heparin  injection 5,000 Units, 5,000 Units, Subcutaneous, Q8H, Shah, Pratik D, DO, 5,000 Units at 11/23/23 0630   lactated ringers  infusion, , Intravenous, Continuous, Kristina Delacruz, Kristina Mutchler, MD   morphine  (PF) 2 MG/ML injection 2 mg, 2 mg, Intravenous, Q2H PRN, Maree, Pratik D, DO, 2 mg at 11/23/23 0746   ondansetron  (ZOFRAN ) tablet 4 mg, 4 mg, Oral, Q6H PRN **OR** ondansetron  (ZOFRAN ) injection 4 mg, 4 mg, Intravenous, Q6H PRN, Maree, Pratik D, DO, 4 mg at 11/23/23 9367   pantoprazole  (PROTONIX ) EC tablet 40 mg, 40 mg, Oral, BID, Maree, Pratik D, DO, 40 mg at 11/22/23 2110   traZODone  (DESYREL ) tablet 150 mg, 150 mg, Oral, QHS, Shah, Pratik D, DO, 150 mg at 11/22/23 2230   venlafaxine  XR (EFFEXOR -XR) 24 hr capsule 75 mg, 75 mg, Oral, Q breakfast, Maree, Pratik D, DO   I/O    Intake/Output Summary (Last 24 hours) at 11/23/2023 0909 Last data filed at 11/23/2023 0800 Gross per 24 hour  Intake 1816.49 ml  Output --  Net 1816.49 ml     Physical Exam: Temp:  [97.7 F (36.5 C)-99 F (37.2 C)] 98.4 F (36.9 C) (01/12 0628) Pulse Rate:  [65-89] 77 (01/12 0628) Resp:  [12-27] 18 (01/12 0628) BP: (125-166)/(77-93) 137/77 (01/12 0628) SpO2:  [98 %-100 %] 98 % (01/12 0628) Weight:  [76.7 kg-77.5 kg] 77.5 kg (01/11 1652)  Temp (24hrs), Avg:98.2 F (36.8 C), Min:97.7 F (36.5  C), Max:99 F (37.2 C) GENERAL: The patient is AO x3, in no acute distress. HEENT: Head is normocephalic and atraumatic. EOMI are intact. Mouth is well hydrated and without lesions. NECK: Supple. No masses LUNGS: Clear to auscultation. No presence of rhonchi/wheezing/rales. Adequate chest expansion HEART: RRR, normal s1 and s2. ABDOMEN: diffusely tender, no guarding, no peritoneal signs, and nondistended. BS +. No masses. EXTREMITIES: Without any cyanosis, clubbing, rash, lesions or edema. NEUROLOGIC: AOx3, no focal motor deficit. SKIN: no jaundice, no rashes  Laboratory Data: CBC:     Component Value Date/Time   WBC 8.1 11/23/2023 0347   RBC 4.49 11/23/2023 0347   HGB 13.4 11/23/2023 0347   HGB 13.2 10/28/2023 0940   HCT 40.7 11/23/2023 0347   HCT 38.8 10/28/2023 0940   PLT 226 11/23/2023 0347   PLT 303 10/28/2023 0940   MCV 90.6 11/23/2023 0347   MCV 88 10/28/2023 0940   MCH 29.8 11/23/2023 0347   MCHC 32.9 11/23/2023 0347   RDW 13.2 11/23/2023 0347   RDW 13.2 10/28/2023 0940   LYMPHSABS 1.2 10/28/2023 0940   MONOABS 0.7 01/08/2015 0200   EOSABS 0.1 10/28/2023 0940   BASOSABS 0.0 10/28/2023 0940   COAG: No results found for: INR, PROTIME  BMP:     Latest Ref Rng & Units 11/23/2023    3:47 AM 11/22/2023   11:40 AM 11/19/2023    6:19 PM  BMP  Glucose 70 - 99 mg/dL 93  840  94   BUN 6 - 20 mg/dL 6  8  9    Creatinine 0.44 - 1.00 mg/dL 9.44  9.31  9.27   Sodium 135 - 145 mmol/L 136  135  135   Potassium 3.5 - 5.1 mmol/L 3.6  4.3  4.0   Chloride 98 - 111 mmol/L 106  106  106   CO2 22 - 32 mmol/L 21  21  21    Calcium 8.9 - 10.3 mg/dL 8.3  9.2  9.2     HEPATIC:     Latest Ref Rng & Units 11/23/2023    3:47 AM 11/22/2023   11:40 AM 08/26/2023    8:43 AM  Hepatic Function  Total Protein 6.5 - 8.1 g/dL 6.5  7.8  7.1   Albumin 3.5 - 5.0 g/dL 3.5  4.1  4.3   AST 15 - 41 U/L 369  1,406  14   ALT 0 - 44 U/L 719  1,192  10   Alk Phosphatase 38 - 126 U/L 119  145  70    Total Bilirubin 0.0 - 1.2 mg/dL 1.2  2.9  0.4     CARDIAC: No results found for: CKTOTAL, CKMB, CKMBINDEX, TROPONINI    Imaging: I personally reviewed and interpreted the available labs, imaging and endoscopic files.   Assessment/Plan: Kristina Delacruz is a 43 y.o. female with history of arctic valve insufficiency, GERD, mitral valve insufficiency, anxiety, who came to the hospital after presenting worsening abdominal pain, nausea and vomiting.  Gastroenterology was consulted for evaluation of acute pancreatitis.   Patient has presented intermittent episodes of abdominal pain which appear to be related to biliary colic.  Her imaging was consistent with presence of multiple gallstones and changes consistent with pancreatitis.  Notably, her CBD was completely normal.  However, she had large increase in her aminotransferases and mildly increasing total bilirubin and alkaline phosphatase.  Today, her LFTs dramatically decreased.  I suspect that she may have passed a stone but this led to acute pancreatitis.  We had a thorough discussion with the patient and her family member regarding the possible etiology of her pancreatitis.  She does not drink any alcohol and her calcium/triglycerides were normal.  She was evaluated by general surgery today and will undergo cholecystectomy during the current admission.   For now, we will advance her diet slowly to clear liquid diet and she will be given conservative management with IV fluids, pain control and antiemetics as needed.   - Start clear liquid diet and advance diet as tolerated.  If patient is having recurrent vomiting or worsening abdominal pain, we will need to put him n.p.o. -Continue with IV fluids at least@ 150 cc/h, if patient tolerates diet this can be increased to 75 cc/h -Pain control by primary team -Zofran  4 mg every 6 hours as needed for nausea, can add Reglan as needed if nausea persists -Appreciate general surgery recommendations  and plan for cholecystectomy   Kristina Fortune, MD Gastroenterology and Hepatology [facility] Gastroenterology

## 2023-11-23 NOTE — Congregational Nurse Program (Signed)
 MD Sherryll Burger in to see patient.

## 2023-11-23 NOTE — Plan of Care (Signed)
 Received morphine  and zofran  through out the night for nausea and abdominal pain. Morphine  did help pain level to decrease. Zofran  relieved nausea. One person assist to bathroom.  Problem: Pain Management: Goal: General experience of comfort will improve Outcome: Not Progressing   Problem: Education: Goal: Knowledge of General Education information will improve Description: Including pain rating scale, medication(s)/side effects and non-pharmacologic comfort measures Outcome: Progressing   Problem: Health Behavior/Discharge Planning: Goal: Ability to manage health-related needs will improve Outcome: Progressing   Problem: Clinical Measurements: Goal: Ability to maintain clinical measurements within normal limits will improve Outcome: Progressing Goal: Will remain free from infection Outcome: Progressing Goal: Diagnostic test results will improve Outcome: Progressing Goal: Respiratory complications will improve Outcome: Progressing Goal: Cardiovascular complication will be avoided Outcome: Progressing   Problem: Activity: Goal: Risk for activity intolerance will decrease Outcome: Progressing   Problem: Nutrition: Goal: Adequate nutrition will be maintained Outcome: Progressing   Problem: Coping: Goal: Level of anxiety will decrease Outcome: Progressing   Problem: Elimination: Goal: Will not experience complications related to bowel motility Outcome: Progressing Goal: Will not experience complications related to urinary retention Outcome: Progressing   Problem: Safety: Goal: Ability to remain free from injury will improve Outcome: Progressing   Problem: Skin Integrity: Goal: Risk for impaired skin integrity will decrease Outcome: Progressing

## 2023-11-23 NOTE — Progress Notes (Signed)
 PROGRESS NOTE    Kristina Delacruz  FMW:[id number] DOB: 08-18-1981 DOA: 11/22/2023 PCP: Zarwolo, Gloria, FNP   Brief Narrative:    Kristina Delacruz is a 43 y.o. female with medical history significant for GERD, anxiety/depression, aortic valve insufficiency, and mitral valve disorder who presented to the ED with complaints of right-sided abdominal pain that started 3 days ago.  Patient was admitted with presumed acute gallstone pancreatitis.  Diet is being advanced and laparoscopic cholecystectomy is being planned for 1/14 versus 1/15.  Assessment & Plan:   Principal Problem:   Acute pancreatitis Active Problems:   Irregular periods   Moody   Depression, major, single episode, moderate (HCC)   Class 1 obesity due to excess calories without serious comorbidity with body mass index (BMI) of 30.0 to 30.9 in adult   Aortic regurgitation   Gallstone pancreatitis  Assessment and Plan:   Acute gallstone pancreatitis -Appears to be improving -Continue aggressive IV fluid -IV morphine  as needed for pain management -Zofran  for nausea and vomiting -Okay for clear liquid diet -Lipid panel with no triglyceride elevation -Continue to monitor repeat labs -Appreciate plans for laparoscopic cholecystectomy while inpatient   Anxiety/depression -Continue home trazodone  and venlafaxine    GERD -IV PPI   Perimenopausal -Needs to continue drospirenone  4 mg daily for ongoing symptoms   Obesity, class I -BMI 30.91    DVT prophylaxis: Heparin  Code Status: Full Family Communication: None at bedside Disposition Plan: Continue IV medications and plan for laparoscopic cholecystectomy Status is: Inpatient Remains inpatient appropriate because: Need for IV medications.   Consultants:  GI GS  Procedures:  None  Antimicrobials:  None   Subjective: Patient seen and evaluated today with improved abdominal pain symptoms.  No vomiting and mild nausea noted.  Objective: Vitals:    11/22/23 1652 11/22/23 2057 11/23/23 0045 11/23/23 0628  BP: 129/89 131/78 128/77 137/77  Pulse: 89 75 78 77  Resp: 20 18 18 18   Temp: 97.8 F (36.6 C) 97.7 F (36.5 C) 98 F (36.7 C) 98.4 F (36.9 C)  TempSrc: Oral Oral Oral Oral  SpO2: 100% 100% 99% 98%  Weight: 77.5 kg     Height: 5' 2 (1.575 m)       Intake/Output Summary (Last 24 hours) at 11/23/2023 1234 Last data filed at 11/23/2023 0800 Gross per 24 hour  Intake 1816.49 ml  Output --  Net 1816.49 ml   Filed Weights   11/22/23 0938 11/22/23 1652  Weight: 76.7 kg 77.5 kg    Examination:  General exam: Appears calm and comfortable  Respiratory system: Clear to auscultation. Respiratory effort normal. Cardiovascular system: S1 & S2 heard, RRR.  Gastrointestinal system: Abdomen is soft Central nervous system: Alert and awake Extremities: No edema Skin: No significant lesions noted Psychiatry: Flat affect.    Data Reviewed: I have personally reviewed following labs and imaging studies  CBC: Recent Labs  Lab 11/19/23 1819 11/22/23 1140 11/23/23 0347  WBC 6.1 11.1* 8.1  HGB 13.9 15.0 13.4  HCT 41.4 43.3 40.7  MCV 89.6 87.1 90.6  PLT 280 269 226   Basic Metabolic Panel: Recent Labs  Lab 11/19/23 1819 11/22/23 1140 11/23/23 0347  NA 135 135 136  K 4.0 4.3 3.6  CL 106 106 106  CO2 21* 21* 21*  GLUCOSE 94 159* 93  BUN 9 8 6   CREATININE 0.72 0.68 0.55  CALCIUM 9.2 9.2 8.3*  MG  --   --  2.0   GFR: Estimated Creatinine Clearance: 88.4 mL/min (  by C-G formula based on SCr of 0.55 mg/dL). Liver Function Tests: Recent Labs  Lab 11/22/23 1140 11/23/23 0347  AST 1,406* 369*  ALT 1,192* 719*  ALKPHOS 145* 119  BILITOT 2.9* 1.2  PROT 7.8 6.5  ALBUMIN 4.1 3.5   Recent Labs  Lab 11/22/23 1140 11/23/23 0547  LIPASE 3,303* 569*   No results for input(s): AMMONIA in the last 168 hours. Coagulation Profile: No results for input(s): INR, PROTIME in the last 168 hours. Cardiac Enzymes: No  results for input(s): CKTOTAL, CKMB, CKMBINDEX, TROPONINI in the last 168 hours. BNP (last 3 results) No results for input(s): PROBNP in the last 8760 hours. HbA1C: No results for input(s): HGBA1C in the last 72 hours. CBG: No results for input(s): GLUCAP in the last 168 hours. Lipid Profile: Recent Labs    11/22/23 1140 11/23/23 0347  CHOL 207*  --   HDL 61  --   LDLCALC 131*  --   TRIG 77 61  CHOLHDL 3.4  --    Thyroid  Function Tests: No results for input(s): TSH, T4TOTAL, FREET4, T3FREE, THYROIDAB in the last 72 hours. Anemia Panel: No results for input(s): VITAMINB12, FOLATE, FERRITIN, TIBC, IRON, RETICCTPCT in the last 72 hours. Sepsis Labs: No results for input(s): PROCALCITON, LATICACIDVEN in the last 168 hours.  No results found for this or any previous visit (from the past 240 hours).       Radiology Studies: CT ABDOMEN PELVIS W CONTRAST Result Date: 11/22/2023 CLINICAL DATA:  Acute nonlocalized abdominal pain. Right-sided abdominal pain since Wednesday. Vomiting and nausea last night with sweats and bowel movements this morning. EXAM: CT ABDOMEN AND PELVIS WITH CONTRAST TECHNIQUE: Multidetector CT imaging of the abdomen and pelvis was performed using the standard protocol following bolus administration of intravenous contrast. RADIATION DOSE REDUCTION: This exam was performed according to the departmental dose-optimization program which includes automated exposure control, adjustment of the mA and/or kV according to patient size and/or use of iterative reconstruction technique. CONTRAST:  OMNIPAQUE  IOHEXOL  300 MG/ML  SOLN COMPARISON:  Ultrasound abdomen 11/22/2023. CT abdomen and pelvis 04/25/2022 FINDINGS: Lower chest: Dependent atelectasis in the lung bases. Hepatobiliary: Mild diffuse fatty infiltration of the liver with focal fatty infiltration in the left. Diffusely increased density in the gallbladder with multiple stones  present as seen on prior ultrasound. Mild pericholecystic edema is nonspecific. No bile duct dilatation. Pancreas: Pancreatic head is enlarged and hypoenhancing with stranding around the pancreas, porta hepatis, and extending into the anterior pericolic gutters with fluid along the right pericolic gutter almost to the pelvis. Changes are likely to indicate acute interstitial edematous pancreatitis. No abscess or necrosis identified. Spleen: Normal in size without focal abnormality. Adrenals/Urinary Tract: Adrenal glands are unremarkable. Kidneys are normal, without renal calculi, focal lesion, or hydronephrosis. Bladder is unremarkable. Stomach/Bowel: Stomach, small bowel, and colon are not abnormally distended. No wall thickening or inflammatory changes are appreciated. Appendix is normal. Vascular/Lymphatic: No significant vascular findings are present. No enlarged abdominal or pelvic lymph nodes. Reproductive: Uterus and ovaries are not enlarged. Small left ovarian cysts measuring up to about 1.6 cm diameter, likely physiologic. No imaging follow-up is indicated. Other: No free air in the abdomen. Abdominal wall musculature appears intact. Musculoskeletal: No acute or significant osseous findings. IMPRESSION: 1. Inflammatory changes centered at the head of the pancreas with stranding and fluid tracking along the pericolic gutters, more prominent on the right. Changes are consistent with acute interstitial edematous pancreatitis. No abscess or necrosis identified. 2. Cholelithiasis. Mild  pericholecystic edema is likely reactive. No bile duct dilatation. 3. Left ovarian simple-appearing cyst measuring 1.6 cm. No follow-up imaging is recommended. Reference: JACR 2020 Feb;17(2):248-254 Electronically Signed   By: Elsie Gravely M.D.   On: 11/22/2023 13:11   US  Abdomen Limited RUQ (LIVER/GB) Result Date: 11/22/2023 CLINICAL DATA:  RIGHT upper quadrant pain.  Nausea and vomiting. EXAM: ULTRASOUND ABDOMEN LIMITED  RIGHT UPPER QUADRANT COMPARISON:  CT abdomen 04/25/2022 FINDINGS: Gallbladder: There is dense posterior shadowing obscuring much of the gallbladder with hyper echogenic superficial wall (so-called wall echo sign). Findings are consistent with multiple gallstones packed within lumen the gallbladder. Gallbladder wall is mildly thickened 7 mm. No clear sonographic Murphy's sign however sonography reports generalized abdominal pain. Common bile duct: Diameter: Upper limits of normal at 7 mm. Liver: No focal lesion identified. Within normal limits in parenchymal echogenicity. Portal vein is patent on color Doppler imaging with normal direction of blood flow towards the liver. Other: None. IMPRESSION: 1. Multiple gallstones pack the lumen of the gallbladder without clear evidence acute cholecystitis. 2. Common bile duct upper limits of normal. 3. No clear sonographic Murphy's sign. 4. Additional imaging with CT or MRI/MRCP may be appropriate. Electronically Signed   By: Jackquline Boxer M.D.   On: 11/22/2023 11:16        Scheduled Meds:  cycloSPORINE   1 drop Both Eyes BID   Drospirenone   1 tablet Oral Daily   heparin   5,000 Units Subcutaneous Q8H   pantoprazole   40 mg Oral BID   traZODone   150 mg Oral QHS   venlafaxine  XR  75 mg Oral Q breakfast   Continuous Infusions:  sodium chloride  150 mL/hr at 11/23/23 0743   lactated ringers        LOS: 1 day    Time spent: 55 minutes    Lindsie Simar JONETTA Fairly, DO [facility]  If 7PM-7AM, please contact night-coverage [email or web address] 11/23/2023, 12:34 PM

## 2023-11-23 NOTE — Consult Note (Signed)
 [facility] Consult  Reason for Consult: Gallstone pancreatitis, need for cholecystectomy Referring Physician: Dr. Loreli  Chief Complaint   Abdominal Pain     HPI: Kristina Delacruz is a 43 y.o. female who presents with a 4 to 5-day history of upper abdominal pain, nausea, and vomiting.  She was initially evaluated in the emergency department last Wednesday for chest pain, and her workup was negative.  She continued to have worsening chest pain/epigastric pain with nausea and vomiting, which prompted her to return to the emergency department on Saturday.  She has a past medical history significant for GERD and chronic back pain for which she is on narcotic pain medications.  Her surgical history is significant for a laparoscopic bilateral salpingectomy.  In the ED, she was noted to have significantly elevated LFTs into the thousands and an elevated lipase.  She had a mild leukocytosis of 11.1.  She initially underwent abdominal ultrasound which demonstrated multiple gallstones within the lumen of the gallbladder without evidence of acute cholecystitis.  She subsequently underwent a CT of the abdomen and pelvis which demonstrated acute interstitial edematous pancreatitis with no abscess or necrosis noted and cholelithiasis.  Today, she is still having abdominal pain, though it is slightly improved.  She is less nauseous as well.  Past Medical History:  Diagnosis Date   Aortic valve insufficiency    Arthritis    GERD (gastroesophageal reflux disease)    Mitral valve disorder    Stress fracture of cervical vertebra    Trichimoniasis 08/13/2021   08/13/21 treated with flagyl .    Past Surgical History:  Procedure Laterality Date   DILATATION AND CURETTAGE/HYSTEROSCOPY WITH MINERVA N/A 04/24/2022   Procedure: DILATATION AND CURETTAGE/HYSTEROSCOPY WITH MINERVA;  Surgeon: Jayne Vonn DEL, MD;  Location: [facility];  Service: Gynecology;  Laterality: N/A;   ESSURE TUBAL LIGATION  Bilateral    LAPAROSCOPIC BILATERAL SALPINGECTOMY N/A 04/24/2022   Procedure: LAPAROSCOPIC BILATERAL SALPINGECTOMY; removal foreign object;  Surgeon: Jayne Vonn DEL, MD;  Location: [facility];  Service: Gynecology;  Laterality: N/A;   TUBAL LIGATION      Family History  Problem Relation Age of Onset   Thyroid  disease Mother    Hypertension Father        AVR   Heart murmur Father    Hypercholesterolemia Sister    Thyroid  disease Sister    Diabetes type I Daughter    Thyroid  disease Paternal Aunt    Thyroid  disease Paternal Uncle    Hypertension Maternal Grandmother    Colon cancer Paternal Grandfather    Breast cancer Neg Hx    Cervical cancer Neg Hx     Social History   Tobacco Use   Smoking status: Former    Types: Cigarettes   Smokeless tobacco: Never  Vaping Use   Vaping status: Never Used  Substance Use Topics   Alcohol use: Not Currently   Drug use: No    Medications: I have reviewed the patient's current medications.  Allergies  Allergen Reactions   Bee Venom Swelling   Fentanyl  Nausea And Vomiting    Prefers morphine  if need be   Onion Rash   Tape Rash     ROS:  Pertinent items are noted in HPI.  Blood pressure 137/77, pulse 77, temperature 98.4 F (36.9 C), temperature source Oral, resp. rate 18, height 5' 2 (1.575 m), weight 77.5 kg, SpO2 98%. Physical Exam Vitals reviewed.  Constitutional:      Appearance: She is well-developed.  HENT:  Head: Normocephalic and atraumatic.  Eyes:     Extraocular Movements: Extraocular movements intact.     Pupils: Pupils are equal, round, and reactive to light.  Cardiovascular:     Rate and Rhythm: Normal rate.  Pulmonary:     Effort: Pulmonary effort is normal.  Abdominal:     Comments: Abdomen soft, nondistended, no percussion tenderness, tenderness to palpation in bilateral upper quadrants and in the epigastrium; no rigidity, guarding, rebound tenderness; negative Murphy sign  Skin:    General: Skin is  warm and dry.  Neurological:     General: No focal deficit present.     Mental Status: She is alert and oriented to person, place, and time.  Psychiatric:        Mood and Affect: Mood normal.        Behavior: Behavior normal.     Results: Results for orders placed or performed during the hospital encounter of 11/22/23 (from the past 48 hours)  Urinalysis, Routine w reflex microscopic -Urine, Clean Catch     Status: Abnormal   Collection Time: 11/22/23  9:41 AM  Result Value Ref Range   Color, Urine YELLOW YELLOW   APPearance CLEAR CLEAR   Specific Gravity, Urine 1.041 (H) 1.005 - 1.030   pH 5.0 5.0 - 8.0   Glucose, UA NEGATIVE NEGATIVE mg/dL   Hgb urine dipstick NEGATIVE NEGATIVE   Bilirubin Urine NEGATIVE NEGATIVE   Ketones, ur 5 (A) NEGATIVE mg/dL   Protein, ur NEGATIVE NEGATIVE mg/dL   Nitrite NEGATIVE NEGATIVE   Leukocytes,Ua NEGATIVE NEGATIVE    Comment: Performed at [facility], [address], [place], [place] [address]  Pregnancy, urine     Status: None   Collection Time: 11/22/23  9:55 AM  Result Value Ref Range   Preg Test, Ur NEGATIVE NEGATIVE    Comment:        THE SENSITIVITY OF THIS METHODOLOGY IS >25 mIU/mL. Performed at [facility], [address] [place], [place] [address]   Lipase, blood     Status: Abnormal   Collection Time: 11/22/23 11:40 AM  Result Value Ref Range   Lipase 3,303 (H) 11 - 51 U/L    Comment: RESULT CONFIRMED BY MANUAL DILUTION Performed at [facility], [address], [place], [place] [address]   Comprehensive metabolic panel     Status: Abnormal   Collection Time: 11/22/23 11:40 AM  Result Value Ref Range   Sodium 135 135 - 145 mmol/L   Potassium 4.3 3.5 - 5.1 mmol/L   Chloride 106 98 - 111 mmol/L   CO2 21 (L) 22 - 32 mmol/L   Glucose, Bld 159 (H) 70 - 99 mg/dL    Comment: Glucose reference range applies only to samples taken after fasting for at least 8 hours.   BUN 8 6 - 20 mg/dL   Creatinine, Ser 9.31 0.44 - 1.00 mg/dL    Calcium 9.2 8.9 - 89.6 mg/dL   Total Protein 7.8 6.5 - 8.1 g/dL   Albumin 4.1 3.5 - 5.0 g/dL   AST 8,593 (H) 15 - 41 U/L   ALT 1,192 (H) 0 - 44 U/L   Alkaline Phosphatase 145 (H) 38 - 126 U/L   Total Bilirubin 2.9 (H) 0.0 - 1.2 mg/dL   GFR, Estimated >39 >39 mL/min    Comment: (NOTE) Calculated using the CKD-EPI Creatinine Equation (2021)    Anion gap 8 5 - 15    Comment: Performed at [facility], [address] [place],  [place] [address]  CBC     Status: Abnormal   Collection Time: 11/22/23 11:40 AM  Result Value Ref Range   WBC 11.1 (H) 4.0 - 10.5 K/uL   RBC 4.97 3.87 - 5.11 MIL/uL   Hemoglobin 15.0 12.0 - 15.0 g/dL   HCT 56.6 63.9 - 53.9 %   MCV 87.1 80.0 - 100.0 fL   MCH 30.2 26.0 - 34.0 pg   MCHC 34.6 30.0 - 36.0 g/dL   RDW 87.4 88.4 - 84.4 %   Platelets 269 150 - 400 K/uL   nRBC 0.0 0.0 - 0.2 %    Comment: Performed at [facility], [address] [place], [place] [address]  Lipid panel     Status: Abnormal   Collection Time: 11/22/23 11:40 AM  Result Value Ref Range   Cholesterol 207 (H) 0 - 200 mg/dL   Triglycerides 77 <849 mg/dL   HDL 61 >59 mg/dL   Total CHOL/HDL Ratio 3.4 RATIO   VLDL 15 0 - 40 mg/dL   LDL Cholesterol 868 (H) 0 - 99 mg/dL    Comment:        Total Cholesterol/HDL:CHD Risk Coronary Heart Disease Risk Table                     Men   Women  1/2 Average Risk   3.4   3.3  Average Risk       5.0   4.4  2 X Average Risk   9.6   7.1  3 X Average Risk  23.4   11.0        Use the calculated Patient Ratio above and the CHD Risk Table to determine the patient's CHD Risk.        ATP III CLASSIFICATION (LDL):  <100     mg/dL   Optimal  899-870  mg/dL   Near or Above                    Optimal  130-159  mg/dL   Borderline  839-810  mg/dL   High  >809     mg/dL   Very High Performed at [facility], [address], [place], [place] [address]   Triglycerides     Status: None   Collection Time: 11/23/23  3:47 AM  Result Value Ref Range    Triglycerides 61 <150 mg/dL    Comment: Performed at [facility], [address], [place], [place] [address]  Magnesium     Status: None   Collection Time: 11/23/23  3:47 AM  Result Value Ref Range   Magnesium 2.0 1.7 - 2.4 mg/dL    Comment: Performed at [facility], [address], [place], [place] [address]  Comprehensive metabolic panel     Status: Abnormal   Collection Time: 11/23/23  3:47 AM  Result Value Ref Range   Sodium 136 135 - 145 mmol/L   Potassium 3.6 3.5 - 5.1 mmol/L   Chloride 106 98 - 111 mmol/L   CO2 21 (L) 22 - 32 mmol/L   Glucose, Bld 93 70 - 99 mg/dL    Comment: Glucose reference range applies only to samples taken after fasting for at least 8 hours.   BUN 6 6 - 20 mg/dL   Creatinine, Ser 9.44 0.44 - 1.00 mg/dL   Calcium 8.3 (L) 8.9 - 10.3 mg/dL   Total Protein 6.5 6.5 - 8.1 g/dL   Albumin 3.5 3.5 - 5.0 g/dL   AST  369 (H) 15 - 41 U/L   ALT 719 (H) 0 - 44 U/L   Alkaline Phosphatase 119 38 - 126 U/L   Total Bilirubin 1.2 0.0 - 1.2 mg/dL   GFR, Estimated >39 >39 mL/min    Comment: (NOTE) Calculated using the CKD-EPI Creatinine Equation (2021)    Anion gap 9 5 - 15    Comment: Performed at [facility], [address] [place], [place] [address]  CBC     Status: None   Collection Time: 11/23/23  3:47 AM  Result Value Ref Range   WBC 8.1 4.0 - 10.5 K/uL   RBC 4.49 3.87 - 5.11 MIL/uL   Hemoglobin 13.4 12.0 - 15.0 g/dL   HCT 59.2 63.9 - 53.9 %   MCV 90.6 80.0 - 100.0 fL   MCH 29.8 26.0 - 34.0 pg   MCHC 32.9 30.0 - 36.0 g/dL   RDW 86.7 88.4 - 84.4 %   Platelets 226 150 - 400 K/uL   nRBC 0.0 0.0 - 0.2 %    Comment: Performed at [facility], [address], [place], [place] [address]  Lipase, blood     Status: Abnormal   Collection Time: 11/23/23  5:47 AM  Result Value Ref Range   Lipase 569 (H) 11 - 51 U/L    Comment: RESULTS CONFIRMED BY MANUAL DILUTION Performed at [facility], [address], [place], [place] [address]     CT ABDOMEN PELVIS W  CONTRAST Result Date: 11/22/2023 CLINICAL DATA:  Acute nonlocalized abdominal pain. Right-sided abdominal pain since Wednesday. Vomiting and nausea last night with sweats and bowel movements this morning. EXAM: CT ABDOMEN AND PELVIS WITH CONTRAST TECHNIQUE: Multidetector CT imaging of the abdomen and pelvis was performed using the standard protocol following bolus administration of intravenous contrast. RADIATION DOSE REDUCTION: This exam was performed according to the departmental dose-optimization program which includes automated exposure control, adjustment of the mA and/or kV according to patient size and/or use of iterative reconstruction technique. CONTRAST:  OMNIPAQUE  IOHEXOL  300 MG/ML  SOLN COMPARISON:  Ultrasound abdomen 11/22/2023. CT abdomen and pelvis 04/25/2022 FINDINGS: Lower chest: Dependent atelectasis in the lung bases. Hepatobiliary: Mild diffuse fatty infiltration of the liver with focal fatty infiltration in the left. Diffusely increased density in the gallbladder with multiple stones present as seen on prior ultrasound. Mild pericholecystic edema is nonspecific. No bile duct dilatation. Pancreas: Pancreatic head is enlarged and hypoenhancing with stranding around the pancreas, porta hepatis, and extending into the anterior pericolic gutters with fluid along the right pericolic gutter almost to the pelvis. Changes are likely to indicate acute interstitial edematous pancreatitis. No abscess or necrosis identified. Spleen: Normal in size without focal abnormality. Adrenals/Urinary Tract: Adrenal glands are unremarkable. Kidneys are normal, without renal calculi, focal lesion, or hydronephrosis. Bladder is unremarkable. Stomach/Bowel: Stomach, small bowel, and colon are not abnormally distended. No wall thickening or inflammatory changes are appreciated. Appendix is normal. Vascular/Lymphatic: No significant vascular findings are present. No enlarged abdominal or pelvic lymph nodes.  Reproductive: Uterus and ovaries are not enlarged. Small left ovarian cysts measuring up to about 1.6 cm diameter, likely physiologic. No imaging follow-up is indicated. Other: No free air in the abdomen. Abdominal wall musculature appears intact. Musculoskeletal: No acute or significant osseous findings. IMPRESSION: 1. Inflammatory changes centered at the head of the pancreas with stranding and fluid tracking along the pericolic gutters, more prominent on the right. Changes are consistent with acute interstitial edematous pancreatitis. No abscess or necrosis identified. 2.  Cholelithiasis. Mild pericholecystic edema is likely reactive. No bile duct dilatation. 3. Left ovarian simple-appearing cyst measuring 1.6 cm. No follow-up imaging is recommended. Reference: JACR 2020 Feb;17(2):248-254 Electronically Signed   By: Elsie Gravely M.D.   On: 11/22/2023 13:11   US  Abdomen Limited RUQ (LIVER/GB) Result Date: 11/22/2023 CLINICAL DATA:  RIGHT upper quadrant pain.  Nausea and vomiting. EXAM: ULTRASOUND ABDOMEN LIMITED RIGHT UPPER QUADRANT COMPARISON:  CT abdomen 04/25/2022 FINDINGS: Gallbladder: There is dense posterior shadowing obscuring much of the gallbladder with hyper echogenic superficial wall (so-called wall echo sign). Findings are consistent with multiple gallstones packed within lumen the gallbladder. Gallbladder wall is mildly thickened 7 mm. No clear sonographic Murphy's sign however sonography reports generalized abdominal pain. Common bile duct: Diameter: Upper limits of normal at 7 mm. Liver: No focal lesion identified. Within normal limits in parenchymal echogenicity. Portal vein is patent on color Doppler imaging with normal direction of blood flow towards the liver. Other: None. IMPRESSION: 1. Multiple gallstones pack the lumen of the gallbladder without clear evidence acute cholecystitis. 2. Common bile duct upper limits of normal. 3. No clear sonographic Murphy's sign. 4. Additional imaging  with CT or MRI/MRCP may be appropriate. Electronically Signed   By: Jackquline Boxer M.D.   On: 11/22/2023 11:16     Assessment & Plan:  Kristina Delacruz is a 43 y.o. female who was admitted with presumed gallstone pancreatitis.  Imaging and blood work evaluated by myself.  -We discussed the pathophysiology of gallbladder disease and gallstone pancreatitis.  We discussed treatment options for both pancreatitis and gallstone pancreatitis.  I explained to the patient that we will plan for cholecystectomy most likely while she is inpatient, though we want to wait until the inflammation from her pancreas has slightly improved prior to taking her to surgery -I will discuss the details of surgery with the patient tomorrow -Likely plan for cholecystectomy either Tuesday or Wednesday pending OR availability -Continue to monitor LFTs with daily labs -Okay for diet from surgical standpoint.  Currently with clears ordered, may advance as tolerated -PRN pain control and antiemetics -IV fluids per primary team -Appreciate GI and hospitalist recommendations  All questions were answered to the satisfaction of the patient.  -- Dorothyann Brittle, DO [facility] [address] [place], [place] [address] [phone] (office)

## 2023-11-24 DIAGNOSIS — K851 Biliary acute pancreatitis without necrosis or infection: Secondary | ICD-10-CM | POA: Diagnosis not present

## 2023-11-24 DIAGNOSIS — K859 Acute pancreatitis without necrosis or infection, unspecified: Secondary | ICD-10-CM | POA: Diagnosis not present

## 2023-11-24 LAB — CBC
HCT: 40.2 % (ref 36.0–46.0)
Hemoglobin: 13.4 g/dL (ref 12.0–15.0)
MCH: 29.1 pg (ref 26.0–34.0)
MCHC: 33.3 g/dL (ref 30.0–36.0)
MCV: 87.4 fL (ref 80.0–100.0)
Platelets: 218 10*3/uL (ref 150–400)
RBC: 4.6 MIL/uL (ref 3.87–5.11)
RDW: 12.6 % (ref 11.5–15.5)
WBC: 13.5 10*3/uL — ABNORMAL HIGH (ref 4.0–10.5)
nRBC: 0 % (ref 0.0–0.2)

## 2023-11-24 LAB — COMPREHENSIVE METABOLIC PANEL
ALT: 383 U/L — ABNORMAL HIGH (ref 0–44)
AST: 77 U/L — ABNORMAL HIGH (ref 15–41)
Albumin: 3.5 g/dL (ref 3.5–5.0)
Alkaline Phosphatase: 102 U/L (ref 38–126)
Anion gap: 10 (ref 5–15)
BUN: <5 mg/dL — ABNORMAL LOW (ref 6–20)
CO2: 18 mmol/L — ABNORMAL LOW (ref 22–32)
Calcium: 8.6 mg/dL — ABNORMAL LOW (ref 8.9–10.3)
Chloride: 104 mmol/L (ref 98–111)
Creatinine, Ser: 0.48 mg/dL (ref 0.44–1.00)
GFR, Estimated: >60 mL/min (ref >60)
Glucose, Bld: 109 mg/dL — ABNORMAL HIGH (ref 70–99)
Potassium: 3.2 mmol/L — ABNORMAL LOW (ref 3.5–5.1)
Sodium: 132 mmol/L — ABNORMAL LOW (ref 135–145)
Total Bilirubin: 1.1 mg/dL (ref 0.0–1.2)
Total Protein: 6.7 g/dL (ref 6.5–8.1)

## 2023-11-24 LAB — MAGNESIUM: Magnesium: 1.9 mg/dL (ref 1.7–2.4)

## 2023-11-24 MED ORDER — CYCLOBENZAPRINE HCL 10 MG PO TABS
5.0000 mg | ORAL_TABLET | Freq: Three times a day (TID) | ORAL | Status: DC | PRN
  Administered 2023-11-24 – 2023-11-28 (×7): 5 mg via ORAL
  Filled 2023-11-24 (×8): qty 1

## 2023-11-24 MED ORDER — SENNOSIDES-DOCUSATE SODIUM 8.6-50 MG PO TABS
1.0000 | ORAL_TABLET | Freq: Two times a day (BID) | ORAL | Status: DC
Start: 2023-11-24 — End: 2023-11-28
  Administered 2023-11-24 – 2023-11-28 (×9): 1 via ORAL
  Filled 2023-11-24 (×9): qty 1

## 2023-11-24 MED ORDER — INDOCYANINE GREEN 25 MG IV SOLR
2.5000 mg | Freq: Once | INTRAVENOUS | Status: AC
  Administered 2023-11-25: 2.5 mg via INTRAVENOUS
  Filled 2023-11-24: qty 10

## 2023-11-24 MED ORDER — GABAPENTIN 100 MG PO CAPS
200.0000 mg | ORAL_CAPSULE | Freq: Every day | ORAL | Status: DC
  Administered 2023-11-24 – 2023-11-27 (×4): 200 mg via ORAL
  Filled 2023-11-24 (×4): qty 2

## 2023-11-24 MED ORDER — POTASSIUM CHLORIDE CRYS ER 20 MEQ PO TBCR
40.0000 meq | EXTENDED_RELEASE_TABLET | Freq: Once | ORAL | Status: AC
  Administered 2023-11-24: 40 meq via ORAL
  Filled 2023-11-24: qty 2

## 2023-11-24 MED ORDER — BISACODYL 10 MG RE SUPP
10.0000 mg | Freq: Every day | RECTAL | Status: DC
  Administered 2023-11-26 – 2023-11-27 (×2): 10 mg via RECTAL
  Filled 2023-11-24 (×5): qty 1

## 2023-11-24 MED ORDER — CHLORHEXIDINE GLUCONATE CLOTH 2 % EX PADS
6.0000 | MEDICATED_PAD | Freq: Once | CUTANEOUS | Status: AC
  Administered 2023-11-24: 6 via TOPICAL

## 2023-11-24 MED ORDER — CEFAZOLIN SODIUM-DEXTROSE 2-4 GM/100ML-% IV SOLN
2.0000 g | INTRAVENOUS | Status: AC
  Administered 2023-11-25: 2 g via INTRAVENOUS
  Filled 2023-11-24: qty 100

## 2023-11-24 MED ORDER — ACETAMINOPHEN 325 MG PO TABS: 650.0000 mg | ORAL_TABLET | Freq: Four times a day (QID) | ORAL | Status: DC | PRN

## 2023-11-24 MED ORDER — SODIUM CHLORIDE 0.9 % IV SOLN
INTRAVENOUS | Status: AC
Start: 2023-11-24 — End: 2023-11-25

## 2023-11-24 MED ORDER — POLYETHYLENE GLYCOL 3350 17 G PO PACK
17.0000 g | PACK | Freq: Every day | ORAL | Status: DC
  Administered 2023-11-26: 17 g via ORAL
  Filled 2023-11-24 (×5): qty 1

## 2023-11-24 NOTE — Progress Notes (Addendum)
 Gastroenterology Progress Note     Patient ID: Kristina Delacruz; [id number]; 26-Feb-1981    Subjective   Abdominal pain 9 out of 10 this morning. Eating clear liquids worsens. Associated nausea.    Objective   Vital signs in last 24 hours Temp:  [98.3 F (36.8 C)-98.6 F (37 C)] 98.6 F (37 C) (01/13 0600) Pulse Rate:  [81-88] 88 (01/13 0842) Resp:  [16-17] 16 (01/13 0600) BP: (133-149)/(79-80) 149/80 (01/13 0842) SpO2:  [97 %] 97 % (01/13 0842) Last BM Date : 11/22/23  Physical Exam General:   Alert and oriented, pleasant Head:  Normocephalic and atraumatic. Abdomen:  Bowel sounds present, soft, TTP epigastric/RUQ Neurologic:  Alert and  oriented x4  Intake/Output from previous day: 01/12 0701 - 01/13 0700 In: 940 [P.O.:940] Out: -  Intake/Output this shift: No intake/output data recorded.  Lab Results  Recent Labs    11/22/23 1140 11/23/23 0347 11/24/23 0525  WBC 11.1* 8.1 13.5*  HGB 15.0 13.4 13.4  HCT 43.3 40.7 40.2  PLT 269 226 218   BMET Recent Labs    11/22/23 1140 11/23/23 0347 11/24/23 0525  NA 135 136 132*  K 4.3 3.6 3.2*  CL 106 106 104  CO2 21* 21* 18*  GLUCOSE 159* 93 109*  BUN 8 6 <5*  CREATININE 0.68 0.55 0.48  CALCIUM 9.2 8.3* 8.6*   LFT Recent Labs    11/22/23 1140 11/23/23 0347 11/24/23 0525  PROT 7.8 6.5 6.7  ALBUMIN 4.1 3.5 3.5  AST 1,406* 369* 77*  ALT 1,192* 719* 383*  ALKPHOS 145* 119 102  BILITOT 2.9* 1.2 1.1    Studies/Results CT ABDOMEN PELVIS W CONTRAST Result Date: 11/22/2023 CLINICAL DATA:  Acute nonlocalized abdominal pain. Right-sided abdominal pain since Wednesday. Vomiting and nausea last night with sweats and bowel movements this morning. EXAM: CT ABDOMEN AND PELVIS WITH CONTRAST TECHNIQUE: Multidetector CT imaging of the abdomen and pelvis was performed using the standard protocol following bolus administration of intravenous contrast. RADIATION DOSE REDUCTION: This exam was performed according to the  departmental dose-optimization program which includes automated exposure control, adjustment of the mA and/or kV according to patient size and/or use of iterative reconstruction technique. CONTRAST:  OMNIPAQUE  IOHEXOL  300 MG/ML  SOLN COMPARISON:  Ultrasound abdomen 11/22/2023. CT abdomen and pelvis 04/25/2022 FINDINGS: Lower chest: Dependent atelectasis in the lung bases. Hepatobiliary: Mild diffuse fatty infiltration of the liver with focal fatty infiltration in the left. Diffusely increased density in the gallbladder with multiple stones present as seen on prior ultrasound. Mild pericholecystic edema is nonspecific. No bile duct dilatation. Pancreas: Pancreatic head is enlarged and hypoenhancing with stranding around the pancreas, porta hepatis, and extending into the anterior pericolic gutters with fluid along the right pericolic gutter almost to the pelvis. Changes are likely to indicate acute interstitial edematous pancreatitis. No abscess or necrosis identified. Spleen: Normal in size without focal abnormality. Adrenals/Urinary Tract: Adrenal glands are unremarkable. Kidneys are normal, without renal calculi, focal lesion, or hydronephrosis. Bladder is unremarkable. Stomach/Bowel: Stomach, small bowel, and colon are not abnormally distended. No wall thickening or inflammatory changes are appreciated. Appendix is normal. Vascular/Lymphatic: No significant vascular findings are present. No enlarged abdominal or pelvic lymph nodes. Reproductive: Uterus and ovaries are not enlarged. Small left ovarian cysts measuring up to about 1.6 cm diameter, likely physiologic. No imaging follow-up is indicated. Other: No free air in the abdomen. Abdominal wall musculature appears intact. Musculoskeletal: No acute or significant osseous findings. IMPRESSION: 1. Inflammatory changes  centered at the head of the pancreas with stranding and fluid tracking along the pericolic gutters, more prominent on the right. Changes are  consistent with acute interstitial edematous pancreatitis. No abscess or necrosis identified. 2. Cholelithiasis. Mild pericholecystic edema is likely reactive. No bile duct dilatation. 3. Left ovarian simple-appearing cyst measuring 1.6 cm. No follow-up imaging is recommended. Reference: JACR 2020 Feb;17(2):248-254 Electronically Signed   By: Elsie Gravely M.D.   On: 11/22/2023 13:11   US  Abdomen Limited RUQ (LIVER/GB) Result Date: 11/22/2023 CLINICAL DATA:  RIGHT upper quadrant pain.  Nausea and vomiting. EXAM: ULTRASOUND ABDOMEN LIMITED RIGHT UPPER QUADRANT COMPARISON:  CT abdomen 04/25/2022 FINDINGS: Gallbladder: There is dense posterior shadowing obscuring much of the gallbladder with hyper echogenic superficial wall (so-called wall echo sign). Findings are consistent with multiple gallstones packed within lumen the gallbladder. Gallbladder wall is mildly thickened 7 mm. No clear sonographic Murphy's sign however sonography reports generalized abdominal pain. Common bile duct: Diameter: Upper limits of normal at 7 mm. Liver: No focal lesion identified. Within normal limits in parenchymal echogenicity. Portal vein is patent on color Doppler imaging with normal direction of blood flow towards the liver. Other: None. IMPRESSION: 1. Multiple gallstones pack the lumen of the gallbladder without clear evidence acute cholecystitis. 2. Common bile duct upper limits of normal. 3. No clear sonographic Murphy's sign. 4. Additional imaging with CT or MRI/MRCP may be appropriate. Electronically Signed   By: Jackquline Boxer M.D.   On: 11/22/2023 11:16   DG Chest 2 View Result Date: 11/19/2023 CLINICAL DATA:  Chest pain EXAM: CHEST - 2 VIEW COMPARISON:  10/28/2023 FINDINGS: The heart size and mediastinal contours are within normal limits. Low lung volumes with basilar atelectasis. Otherwise no focal consolidation, pleural effusion, or pneumothorax. No displaced rib fractures. IMPRESSION: No active cardiopulmonary  disease. Electronically Signed   By: Norman Gatlin M.D.   On: 11/19/2023 19:12   DG Chest 2 View Result Date: 10/28/2023 CLINICAL DATA:  Cough with short of breath for 3 weeks EXAM: CHEST - 2 VIEW COMPARISON:  None Available. FINDINGS: Normal cardiac silhouette. There is subtle increased interstitial markings in the RIGHT lower lobe. Potential airspace density over the RIGHT cardiophrenic angle. No pleural fluid. No pneumothorax. IMPRESSION: Potential RIGHT lower pneumonia versus bronchitis. Followup PA and lateral chest X-ray is recommended in 3-4 weeks following trial of antibiotic therapy to ensure resolution and exclude underlying malignancy. Electronically Signed   By: Jackquline Boxer M.D.   On: 10/28/2023 12:56    Assessment  43 y.o. female with multimorbidities presenting with acute gallstone pancreatitis.   Continues with intermittent abdominal pain, worsened by oral intake. Tbili has normalized from admission (2.9 to 1.1). Markedly elevated transaminases at time of presentation with continued marked improvement. (AST 1406>>369>>77 today, ALT 1192>>719>>383).  Appreciate surgery following and plans for tentative cholecystectomy on 1/14. Agree with bowel regimen as ordered by surgery, as she is at risk for worsening constipation in setting of opioids for pain control.   Plan / Recommendations  Appreciate General Surgery following and plans for inpatient cholecystectomy Agree with bowel regimen Continue to trend lipase, HFP Will sign off; please reach out if any further GI assistance needed.     LOS: 2 days    11/24/2023, 9:23 AM  Therisa MICAEL Stager, PhD, ANP-BC [facility]

## 2023-11-24 NOTE — Progress Notes (Signed)
 PROGRESS NOTE    Kristina Delacruz  FMW:[id number] DOB: November 14, 1980 DOA: 11/22/2023 PCP: Zarwolo, Gloria, FNP   Brief Narrative:    Kristina Delacruz is a 43 y.o. female with medical history significant for GERD, anxiety/depression, aortic valve insufficiency, and mitral valve disorder who presented to the ED with complaints of right-sided abdominal pain that started 3 days ago.  Patient was admitted with presumed acute gallstone pancreatitis.  Diet is being advanced and laparoscopic cholecystectomy is being planned for 1/14 versus 1/15.  Assessment & Plan:   Principal Problem:   Acute pancreatitis Active Problems:   Irregular periods   Moody   Depression, major, single episode, moderate (HCC)   Class 1 obesity due to excess calories without serious comorbidity with body mass index (BMI) of 30.0 to 30.9 in adult   Aortic regurgitation   Gallstone pancreatitis  Assessment and Plan:   Acute gallstone pancreatitis with transaminitis-improving -Continue aggressive IV fluid -IV morphine  as needed for pain management -Zofran  for nausea and vomiting -Okay for clear liquid diet -Lipid panel with no triglyceride elevation -Continue to monitor repeat labs -Appreciate plans for laparoscopic cholecystectomy while inpatient  Headache/back pain -Resume home gabapentin  -Tylenol  muscle relaxers ordered as needed   Anxiety/depression -Continue home trazodone  and venlafaxine    GERD -IV PPI   Perimenopausal -Needs to continue drospirenone  4 mg daily for ongoing symptoms   Obesity, class I -BMI 30.91    DVT prophylaxis: Heparin  Code Status: Full Family Communication: Roommate at bedside 1/13 Disposition Plan: Continue IV medications and plan for laparoscopic cholecystectomy Status is: Inpatient Remains inpatient appropriate because: Need for IV medications.   Consultants:  GI GS  Procedures:  None  Antimicrobials:  None   Subjective: Patient seen and evaluated today  with some headache and back pain noted.  She is feeling somewhat nauseous this morning, but has not vomited.  Objective: Vitals:   11/23/23 1309 11/23/23 2030 11/24/23 0600 11/24/23 0842  BP: 133/79   (!) 149/80  Pulse: 81  84 88  Resp: 17  16   Temp: 98.3 F (36.8 C)  98.6 F (37 C)   TempSrc:   Oral   SpO2: 97% 97% 97% 97%  Weight:      Height:        Intake/Output Summary (Last 24 hours) at 11/24/2023 0848 Last data filed at 11/24/2023 0600 Gross per 24 hour  Intake 940 ml  Output --  Net 940 ml   Filed Weights   11/22/23 0938 11/22/23 1652  Weight: 76.7 kg 77.5 kg    Examination:  General exam: Appears calm and comfortable  Respiratory system: Clear to auscultation. Respiratory effort normal. Cardiovascular system: S1 & S2 heard, RRR.  Gastrointestinal system: Abdomen is soft Central nervous system: Alert and awake Extremities: No edema Skin: No significant lesions noted Psychiatry: Flat affect.    Data Reviewed: I have personally reviewed following labs and imaging studies  CBC: Recent Labs  Lab 11/19/23 1819 11/22/23 1140 11/23/23 0347 11/24/23 0525  WBC 6.1 11.1* 8.1 13.5*  HGB 13.9 15.0 13.4 13.4  HCT 41.4 43.3 40.7 40.2  MCV 89.6 87.1 90.6 87.4  PLT 280 269 226 218   Basic Metabolic Panel: Recent Labs  Lab 11/19/23 1819 11/22/23 1140 11/23/23 0347 11/24/23 0525  NA 135 135 136 132*  K 4.0 4.3 3.6 3.2*  CL 106 106 106 104  CO2 21* 21* 21* 18*  GLUCOSE 94 159* 93 109*  BUN 9 8 6  <5*  CREATININE 0.72  0.68 0.55 0.48  CALCIUM 9.2 9.2 8.3* 8.6*  MG  --   --  2.0 1.9   GFR: Estimated Creatinine Clearance: 88.4 mL/min (by C-G formula based on SCr of 0.48 mg/dL). Liver Function Tests: Recent Labs  Lab 11/22/23 1140 11/23/23 0347 11/24/23 0525  AST 1,406* 369* 77*  ALT 1,192* 719* 383*  ALKPHOS 145* 119 102  BILITOT 2.9* 1.2 1.1  PROT 7.8 6.5 6.7  ALBUMIN 4.1 3.5 3.5   Recent Labs  Lab 11/22/23 1140 11/23/23 0547  LIPASE 3,303*  569*   No results for input(s): AMMONIA in the last 168 hours. Coagulation Profile: No results for input(s): INR, PROTIME in the last 168 hours. Cardiac Enzymes: No results for input(s): CKTOTAL, CKMB, CKMBINDEX, TROPONINI in the last 168 hours. BNP (last 3 results) No results for input(s): PROBNP in the last 8760 hours. HbA1C: No results for input(s): HGBA1C in the last 72 hours. CBG: No results for input(s): GLUCAP in the last 168 hours. Lipid Profile: Recent Labs    11/22/23 1140 11/23/23 0347  CHOL 207*  --   HDL 61  --   LDLCALC 131*  --   TRIG 77 61  CHOLHDL 3.4  --    Thyroid  Function Tests: No results for input(s): TSH, T4TOTAL, FREET4, T3FREE, THYROIDAB in the last 72 hours. Anemia Panel: No results for input(s): VITAMINB12, FOLATE, FERRITIN, TIBC, IRON, RETICCTPCT in the last 72 hours. Sepsis Labs: No results for input(s): PROCALCITON, LATICACIDVEN in the last 168 hours.  No results found for this or any previous visit (from the past 240 hours).       Radiology Studies: CT ABDOMEN PELVIS W CONTRAST Result Date: 11/22/2023 CLINICAL DATA:  Acute nonlocalized abdominal pain. Right-sided abdominal pain since Wednesday. Vomiting and nausea last night with sweats and bowel movements this morning. EXAM: CT ABDOMEN AND PELVIS WITH CONTRAST TECHNIQUE: Multidetector CT imaging of the abdomen and pelvis was performed using the standard protocol following bolus administration of intravenous contrast. RADIATION DOSE REDUCTION: This exam was performed according to the departmental dose-optimization program which includes automated exposure control, adjustment of the mA and/or kV according to patient size and/or use of iterative reconstruction technique. CONTRAST:  OMNIPAQUE  IOHEXOL  300 MG/ML  SOLN COMPARISON:  Ultrasound abdomen 11/22/2023. CT abdomen and pelvis 04/25/2022 FINDINGS: Lower chest: Dependent atelectasis in the lung  bases. Hepatobiliary: Mild diffuse fatty infiltration of the liver with focal fatty infiltration in the left. Diffusely increased density in the gallbladder with multiple stones present as seen on prior ultrasound. Mild pericholecystic edema is nonspecific. No bile duct dilatation. Pancreas: Pancreatic head is enlarged and hypoenhancing with stranding around the pancreas, porta hepatis, and extending into the anterior pericolic gutters with fluid along the right pericolic gutter almost to the pelvis. Changes are likely to indicate acute interstitial edematous pancreatitis. No abscess or necrosis identified. Spleen: Normal in size without focal abnormality. Adrenals/Urinary Tract: Adrenal glands are unremarkable. Kidneys are normal, without renal calculi, focal lesion, or hydronephrosis. Bladder is unremarkable. Stomach/Bowel: Stomach, small bowel, and colon are not abnormally distended. No wall thickening or inflammatory changes are appreciated. Appendix is normal. Vascular/Lymphatic: No significant vascular findings are present. No enlarged abdominal or pelvic lymph nodes. Reproductive: Uterus and ovaries are not enlarged. Small left ovarian cysts measuring up to about 1.6 cm diameter, likely physiologic. No imaging follow-up is indicated. Other: No free air in the abdomen. Abdominal wall musculature appears intact. Musculoskeletal: No acute or significant osseous findings. IMPRESSION: 1. Inflammatory changes centered at  the head of the pancreas with stranding and fluid tracking along the pericolic gutters, more prominent on the right. Changes are consistent with acute interstitial edematous pancreatitis. No abscess or necrosis identified. 2. Cholelithiasis. Mild pericholecystic edema is likely reactive. No bile duct dilatation. 3. Left ovarian simple-appearing cyst measuring 1.6 cm. No follow-up imaging is recommended. Reference: JACR 2020 Feb;17(2):248-254 Electronically Signed   By: Elsie Gravely M.D.   On:  11/22/2023 13:11   US  Abdomen Limited RUQ (LIVER/GB) Result Date: 11/22/2023 CLINICAL DATA:  RIGHT upper quadrant pain.  Nausea and vomiting. EXAM: ULTRASOUND ABDOMEN LIMITED RIGHT UPPER QUADRANT COMPARISON:  CT abdomen 04/25/2022 FINDINGS: Gallbladder: There is dense posterior shadowing obscuring much of the gallbladder with hyper echogenic superficial wall (so-called wall echo sign). Findings are consistent with multiple gallstones packed within lumen the gallbladder. Gallbladder wall is mildly thickened 7 mm. No clear sonographic Murphy's sign however sonography reports generalized abdominal pain. Common bile duct: Diameter: Upper limits of normal at 7 mm. Liver: No focal lesion identified. Within normal limits in parenchymal echogenicity. Portal vein is patent on color Doppler imaging with normal direction of blood flow towards the liver. Other: None. IMPRESSION: 1. Multiple gallstones pack the lumen of the gallbladder without clear evidence acute cholecystitis. 2. Common bile duct upper limits of normal. 3. No clear sonographic Murphy's sign. 4. Additional imaging with CT or MRI/MRCP may be appropriate. Electronically Signed   By: Jackquline Boxer M.D.   On: 11/22/2023 11:16        Scheduled Meds:  cycloSPORINE   1 drop Both Eyes BID   Drospirenone   1 tablet Oral Daily   gabapentin   100 mg Oral QHS   heparin   5,000 Units Subcutaneous Q8H   pantoprazole   40 mg Oral BID   traZODone   150 mg Oral QHS   venlafaxine  XR  75 mg Oral Q breakfast   Continuous Infusions:  sodium chloride        LOS: 2 days    Time spent: 55 minutes    Varvara Legault JONETTA Fairly, DO [facility]  If 7PM-7AM, please contact night-coverage [email or web address] 11/24/2023, 8:48 AM

## 2023-11-24 NOTE — Telephone Encounter (Signed)
 Copied from CRM [id number]. Topic: General - Other >> Nov 24, 2023 11:37 AM Curlee DEL wrote: Reason for CRM: Patient currently in the hospital for gallbladder removal - they did a chest xray here for their purposes- does the patient still have to get a separate chest xray ordered by Dr. Antonetta?

## 2023-11-24 NOTE — Plan of Care (Signed)

## 2023-11-24 NOTE — Progress Notes (Signed)
 [facility] Progress Note     Subjective: Patient seen and examined.  She is resting comfortably in bed.  She complains of continued abdominal pain with nausea.  She has only had a little bit to drink.  She denies any flatus or bowel movement since being in the hospital.  Objective: Vital signs in last 24 hours: Temp:  [98.3 F (36.8 C)-98.6 F (37 C)] 98.6 F (37 C) (01/13 0600) Pulse Rate:  [81-88] 88 (01/13 0842) Resp:  [16-17] 16 (01/13 0600) BP: (133-149)/(79-80) 149/80 (01/13 0842) SpO2:  [97 %] 97 % (01/13 0842) Last BM Date : 11/22/23  Intake/Output from previous day: 01/12 0701 - 01/13 0700 In: 940 [P.O.:940] Out: -  Intake/Output this shift: No intake/output data recorded.  General appearance: alert, cooperative, and no distress GI: Abdomen soft, nondistended, no percussion tenderness, tenderness to palpation in epigastrium and right upper quadrant; no rigidity, guarding, rebound tenderness  Lab Results:  Recent Labs    11/23/23 0347 11/24/23 0525  WBC 8.1 13.5*  HGB 13.4 13.4  HCT 40.7 40.2  PLT 226 218   BMET Recent Labs    11/23/23 0347 11/24/23 0525  NA 136 132*  K 3.6 3.2*  CL 106 104  CO2 21* 18*  GLUCOSE 93 109*  BUN 6 <5*  CREATININE 0.55 0.48  CALCIUM 8.3* 8.6*   PT/INR No results for input(s): LABPROT, INR in the last 72 hours.  Studies/Results: CT ABDOMEN PELVIS W CONTRAST Result Date: 11/22/2023 CLINICAL DATA:  Acute nonlocalized abdominal pain. Right-sided abdominal pain since Wednesday. Vomiting and nausea last night with sweats and bowel movements this morning. EXAM: CT ABDOMEN AND PELVIS WITH CONTRAST TECHNIQUE: Multidetector CT imaging of the abdomen and pelvis was performed using the standard protocol following bolus administration of intravenous contrast. RADIATION DOSE REDUCTION: This exam was performed according to the departmental dose-optimization program which includes automated exposure control,  adjustment of the mA and/or kV according to patient size and/or use of iterative reconstruction technique. CONTRAST:  OMNIPAQUE  IOHEXOL  300 MG/ML  SOLN COMPARISON:  Ultrasound abdomen 11/22/2023. CT abdomen and pelvis 04/25/2022 FINDINGS: Lower chest: Dependent atelectasis in the lung bases. Hepatobiliary: Mild diffuse fatty infiltration of the liver with focal fatty infiltration in the left. Diffusely increased density in the gallbladder with multiple stones present as seen on prior ultrasound. Mild pericholecystic edema is nonspecific. No bile duct dilatation. Pancreas: Pancreatic head is enlarged and hypoenhancing with stranding around the pancreas, porta hepatis, and extending into the anterior pericolic gutters with fluid along the right pericolic gutter almost to the pelvis. Changes are likely to indicate acute interstitial edematous pancreatitis. No abscess or necrosis identified. Spleen: Normal in size without focal abnormality. Adrenals/Urinary Tract: Adrenal glands are unremarkable. Kidneys are normal, without renal calculi, focal lesion, or hydronephrosis. Bladder is unremarkable. Stomach/Bowel: Stomach, small bowel, and colon are not abnormally distended. No wall thickening or inflammatory changes are appreciated. Appendix is normal. Vascular/Lymphatic: No significant vascular findings are present. No enlarged abdominal or pelvic lymph nodes. Reproductive: Uterus and ovaries are not enlarged. Small left ovarian cysts measuring up to about 1.6 cm diameter, likely physiologic. No imaging follow-up is indicated. Other: No free air in the abdomen. Abdominal wall musculature appears intact. Musculoskeletal: No acute or significant osseous findings. IMPRESSION: 1. Inflammatory changes centered at the head of the pancreas with stranding and fluid tracking along the pericolic gutters, more prominent on the right. Changes are consistent with acute interstitial edematous pancreatitis. No abscess or necrosis  identified.  2. Cholelithiasis. Mild pericholecystic edema is likely reactive. No bile duct dilatation. 3. Left ovarian simple-appearing cyst measuring 1.6 cm. No follow-up imaging is recommended. Reference: JACR 2020 Feb;17(2):248-254 Electronically Signed   By: Elsie Gravely M.D.   On: 11/22/2023 13:11    Anti-infectives: Anti-infectives (From admission, onward)    None       Assessment/Plan:  Patient is a 43 year old female who was admitted with gallstone pancreatitis.  -Plan for robotic assisted laparoscopic cholecystectomy tomorrow afternoon -I counseled the patient about the indications, risks and benefits of robotic assisted laparoscopic cholecystectomy.  She understands there is a very small chance for bleeding, infection, injury to normal structures (including common bile duct), conversion to open surgery, persistent symptoms, evolution of postcholecystectomy diarrhea, need for secondary interventions, anesthesia reaction, cardiopulmonary issues and other risks not specifically detailed here. I described the expected recovery, the plan for follow-up and the restrictions during the recovery phase.  All questions were answered. -Okay to continue clear liquids. NPO at midnight -Prophylactic antibiotics ordered -PRN pain control and antiemetics -IV fluids per primary team -Repeat blood work tomorrow with lipase to verify inflammation continuing to improve -Bowel regimen ordered for the patient -Appreciate GI and hospitalist recommendations    LOS: 2 days    Jeriann Sayres A Tranesha Lessner 11/24/2023

## 2023-11-24 NOTE — Telephone Encounter (Signed)
 no

## 2023-11-25 ENCOUNTER — Inpatient Hospital Stay (HOSPITAL_COMMUNITY): Payer: 59 | Admitting: Anesthesiology

## 2023-11-25 ENCOUNTER — Encounter (HOSPITAL_COMMUNITY)
Admission: EM | Disposition: A | Discharge status: Home / Self Care | Payer: Self-pay | Source: Home / Self Care | Attending: Internal Medicine

## 2023-11-25 DIAGNOSIS — K851 Biliary acute pancreatitis without necrosis or infection: Secondary | ICD-10-CM | POA: Diagnosis not present

## 2023-11-25 HISTORY — PX: GALLBLADDER SURGERY: SHX652

## 2023-11-25 LAB — COMPREHENSIVE METABOLIC PANEL
ALT: 267 U/L — ABNORMAL HIGH (ref 0–44)
AST: 40 U/L (ref 15–41)
Albumin: 3.5 g/dL (ref 3.5–5.0)
Alkaline Phosphatase: 90 U/L (ref 38–126)
Anion gap: 12 (ref 5–15)
BUN: <5 mg/dL — ABNORMAL LOW (ref 6–20)
CO2: 19 mmol/L — ABNORMAL LOW (ref 22–32)
Calcium: 8.8 mg/dL — ABNORMAL LOW (ref 8.9–10.3)
Chloride: 105 mmol/L (ref 98–111)
Creatinine, Ser: 0.46 mg/dL (ref 0.44–1.00)
GFR, Estimated: >60 mL/min (ref >60)
Glucose, Bld: 104 mg/dL — ABNORMAL HIGH (ref 70–99)
Potassium: 3.2 mmol/L — ABNORMAL LOW (ref 3.5–5.1)
Sodium: 136 mmol/L (ref 135–145)
Total Bilirubin: 1.3 mg/dL — ABNORMAL HIGH (ref 0.0–1.2)
Total Protein: 7.3 g/dL (ref 6.5–8.1)

## 2023-11-25 LAB — CBC
HCT: 39.5 % (ref 36.0–46.0)
Hemoglobin: 13.5 g/dL (ref 12.0–15.0)
MCH: 30.3 pg (ref 26.0–34.0)
MCHC: 34.2 g/dL (ref 30.0–36.0)
MCV: 88.6 fL (ref 80.0–100.0)
Platelets: 237 10*3/uL (ref 150–400)
RBC: 4.46 MIL/uL (ref 3.87–5.11)
RDW: 12.7 % (ref 11.5–15.5)
WBC: 13.1 10*3/uL — ABNORMAL HIGH (ref 4.0–10.5)
nRBC: 0 % (ref 0.0–0.2)

## 2023-11-25 LAB — LIPASE, BLOOD: Lipase: 40 U/L (ref 11–51)

## 2023-11-25 LAB — MAGNESIUM: Magnesium: 1.8 mg/dL (ref 1.7–2.4)

## 2023-11-25 SURGERY — CHOLECYSTECTOMY, ROBOT-ASSISTED, LAPAROSCOPIC
Anesthesia: General | Site: Abdomen

## 2023-11-25 MED ORDER — ACETAMINOPHEN 500 MG PO TABS
1000.0000 mg | ORAL_TABLET | Freq: Four times a day (QID) | ORAL | Status: DC
  Administered 2023-11-25 – 2023-11-28 (×9): 1000 mg via ORAL
  Filled 2023-11-25 (×10): qty 2

## 2023-11-25 MED ORDER — INDOCYANINE GREEN 25 MG IV SOLR
INTRAVENOUS | Status: AC
  Filled 2023-11-25: qty 10

## 2023-11-25 MED ORDER — ESMOLOL HCL 100 MG/10ML IV SOLN
INTRAVENOUS | Status: AC
  Filled 2023-11-25: qty 10

## 2023-11-25 MED ORDER — HEMOSTATIC AGENTS (NO CHARGE) OPTIME
TOPICAL | Status: DC | PRN
  Administered 2023-11-25: 1 via TOPICAL

## 2023-11-25 MED ORDER — PROPOFOL 10 MG/ML IV BOLUS
INTRAVENOUS | Status: DC | PRN
  Administered 2023-11-25 (×2): 30 mg via INTRAVENOUS
  Administered 2023-11-25 (×2): 20 mg via INTRAVENOUS
  Administered 2023-11-25: 180 mg via INTRAVENOUS

## 2023-11-25 MED ORDER — LORATADINE 10 MG PO TABS
10.0000 mg | ORAL_TABLET | Freq: Every day | ORAL | Status: DC
  Filled 2023-11-25 (×3): qty 1

## 2023-11-25 MED ORDER — DEXAMETHASONE SODIUM PHOSPHATE 10 MG/ML IJ SOLN
INTRAMUSCULAR | Status: DC | PRN
  Administered 2023-11-25: 10 mg via INTRAVENOUS

## 2023-11-25 MED ORDER — PROPOFOL 10 MG/ML IV BOLUS
INTRAVENOUS | Status: AC
  Filled 2023-11-25: qty 20

## 2023-11-25 MED ORDER — LACTATED RINGERS IV SOLN: INTRAVENOUS | Status: DC

## 2023-11-25 MED ORDER — DEXAMETHASONE SODIUM PHOSPHATE 10 MG/ML IJ SOLN
INTRAMUSCULAR | Status: AC
Start: 2023-11-25 — End: ?
  Filled 2023-11-25: qty 1

## 2023-11-25 MED ORDER — POTASSIUM CHLORIDE CRYS ER 20 MEQ PO TBCR
40.0000 meq | EXTENDED_RELEASE_TABLET | Freq: Once | ORAL | Status: AC
  Administered 2023-11-25: 40 meq via ORAL
  Filled 2023-11-25: qty 2

## 2023-11-25 MED ORDER — OXYCODONE HCL 5 MG PO TABS
5.0000 mg | ORAL_TABLET | ORAL | Status: DC | PRN
  Administered 2023-11-26 – 2023-11-28 (×7): 5 mg via ORAL
  Filled 2023-11-25 (×7): qty 1

## 2023-11-25 MED ORDER — DEXMEDETOMIDINE HCL IN NACL 80 MCG/20ML IV SOLN
INTRAVENOUS | Status: AC
  Filled 2023-11-25: qty 20

## 2023-11-25 MED ORDER — DEXMEDETOMIDINE HCL IN NACL 80 MCG/20ML IV SOLN
INTRAVENOUS | Status: DC | PRN
  Administered 2023-11-25: 8 ug via INTRAVENOUS
  Administered 2023-11-25: 12 ug via INTRAVENOUS
  Administered 2023-11-25: 8 ug via INTRAVENOUS

## 2023-11-25 MED ORDER — SODIUM CHLORIDE 0.9 % IV SOLN
25.0000 mg | Freq: Four times a day (QID) | INTRAVENOUS | Status: DC | PRN
  Administered 2023-11-25: 25 mg via INTRAVENOUS
  Filled 2023-11-25: qty 1

## 2023-11-25 MED ORDER — ONDANSETRON HCL 4 MG/2ML IJ SOLN
INTRAMUSCULAR | Status: DC | PRN
  Administered 2023-11-25: 4 mg via INTRAVENOUS

## 2023-11-25 MED ORDER — ROCURONIUM BROMIDE 10 MG/ML (PF) SYRINGE
PREFILLED_SYRINGE | INTRAVENOUS | Status: AC
Start: 2023-11-25 — End: ?
  Filled 2023-11-25: qty 10

## 2023-11-25 MED ORDER — OXYCODONE HCL 5 MG PO TABS: 5.0000 mg | ORAL_TABLET | Freq: Once | ORAL | Status: DC | PRN

## 2023-11-25 MED ORDER — CETIRIZINE HCL 10 MG PO TABS
10.0000 mg | ORAL_TABLET | Freq: Every day | ORAL | Status: DC
Start: 2023-11-25 — End: 2023-11-25

## 2023-11-25 MED ORDER — SODIUM CHLORIDE (PF) 0.9 % IJ SOLN
INTRAMUSCULAR | Status: AC
  Filled 2023-11-25: qty 20

## 2023-11-25 MED ORDER — HYDROMORPHONE HCL 1 MG/ML IJ SOLN
INTRAMUSCULAR | Status: AC
  Filled 2023-11-25: qty 2

## 2023-11-25 MED ORDER — LIDOCAINE HCL (CARDIAC) PF 100 MG/5ML IV SOSY
PREFILLED_SYRINGE | INTRAVENOUS | Status: DC | PRN
  Administered 2023-11-25: 80 mg via INTRATRACHEAL

## 2023-11-25 MED ORDER — METOPROLOL TARTRATE 5 MG/5ML IV SOLN
INTRAVENOUS | Status: DC | PRN
  Administered 2023-11-25 (×2): 2.5 mg via INTRAVENOUS

## 2023-11-25 MED ORDER — CHLORHEXIDINE GLUCONATE 0.12 % MT SOLN
15.0000 mL | Freq: Once | OROMUCOSAL | Status: AC
  Administered 2023-11-25: 15 mL via OROMUCOSAL

## 2023-11-25 MED ORDER — ONDANSETRON HCL 4 MG/2ML IJ SOLN
INTRAMUSCULAR | Status: AC
  Filled 2023-11-25: qty 2

## 2023-11-25 MED ORDER — ORAL CARE MOUTH RINSE: 15.0000 mL | Freq: Once | OROMUCOSAL | Status: AC

## 2023-11-25 MED ORDER — ESMOLOL HCL 100 MG/10ML IV SOLN
INTRAVENOUS | Status: DC | PRN
  Administered 2023-11-25 (×2): 10 mg via INTRAVENOUS
  Administered 2023-11-25 (×2): 20 mg via INTRAVENOUS

## 2023-11-25 MED ORDER — LIDOCAINE HCL (PF) 2 % IJ SOLN
INTRAMUSCULAR | Status: AC
  Filled 2023-11-25: qty 5

## 2023-11-25 MED ORDER — PROPOFOL 500 MG/50ML IV EMUL
INTRAVENOUS | Status: AC
  Filled 2023-11-25: qty 50

## 2023-11-25 MED ORDER — HYDROMORPHONE HCL 1 MG/ML IJ SOLN
INTRAMUSCULAR | Status: DC | PRN
Start: 2023-11-25 — End: 2023-11-25
  Administered 2023-11-25 (×4): .5 mg via INTRAVENOUS

## 2023-11-25 MED ORDER — ROCURONIUM BROMIDE 10 MG/ML (PF) SYRINGE
PREFILLED_SYRINGE | INTRAVENOUS | Status: DC | PRN
  Administered 2023-11-25: 70 mg via INTRAVENOUS
  Administered 2023-11-25: 20 mg via INTRAVENOUS

## 2023-11-25 MED ORDER — METOPROLOL TARTRATE 5 MG/5ML IV SOLN
INTRAVENOUS | Status: AC
  Filled 2023-11-25: qty 5

## 2023-11-25 MED ORDER — STERILE WATER FOR IRRIGATION IR SOLN
Status: DC | PRN
  Administered 2023-11-25: 500 mL

## 2023-11-25 MED ORDER — OXYCODONE HCL 5 MG/5ML PO SOLN
5.0000 mg | Freq: Once | ORAL | Status: DC | PRN
Start: 2023-11-25 — End: 2023-11-25

## 2023-11-25 MED ORDER — BUPIVACAINE HCL (PF) 0.5 % IJ SOLN
INTRAMUSCULAR | Status: AC
Start: 2023-11-25 — End: ?
  Filled 2023-11-25: qty 30

## 2023-11-25 MED ORDER — PROPOFOL 500 MG/50ML IV EMUL
INTRAVENOUS | Status: DC | PRN
  Administered 2023-11-25: 100 ug/kg/min via INTRAVENOUS

## 2023-11-25 MED ORDER — HYDROMORPHONE HCL 1 MG/ML IJ SOLN: 0.2500 mg | INTRAMUSCULAR | Status: DC | PRN

## 2023-11-25 MED ORDER — BUPIVACAINE HCL (PF) 0.5 % IJ SOLN
INTRAMUSCULAR | Status: DC | PRN
  Administered 2023-11-25: 30 mL

## 2023-11-25 MED ORDER — SUGAMMADEX SODIUM 200 MG/2ML IV SOLN
INTRAVENOUS | Status: DC | PRN
  Administered 2023-11-25: 200 mg via INTRAVENOUS

## 2023-11-25 SURGICAL SUPPLY — 47 items
BLADE SURG 15 STRL LF DISP TIS (BLADE) ×1 IMPLANT
CANNULA REDUCER 12-8 DVNC XI (CANNULA) IMPLANT
CAUTERY HOOK MNPLR 1.6 DVNC XI (INSTRUMENTS) ×1 IMPLANT
CHLORAPREP W/TINT 26 (MISCELLANEOUS) ×1 IMPLANT
CLIP LIGATING HEM O LOK PURPLE (MISCELLANEOUS) ×1 IMPLANT
COVER TIP SHEARS 8 DVNC (MISCELLANEOUS) IMPLANT
DEFOGGER SCOPE WARMER CLEARIFY (MISCELLANEOUS) ×1 IMPLANT
DERMABOND ADVANCED .7 DNX12 (GAUZE/BANDAGES/DRESSINGS) ×1 IMPLANT
DRAPE ARM DVNC X/XI (DISPOSABLE) ×4 IMPLANT
DRAPE COLUMN DVNC XI (DISPOSABLE) ×1 IMPLANT
ELECT REM PT RETURN 9FT ADLT (ELECTROSURGICAL) ×1
ELECTRODE REM PT RTRN 9FT ADLT (ELECTROSURGICAL) ×1 IMPLANT
FORCEPS BPLR R/ABLATION 8 DVNC (INSTRUMENTS) ×1 IMPLANT
FORCEPS PROGRASP DVNC XI (FORCEP) ×1 IMPLANT
GLOVE BIOGEL PI IND STRL 6.5 (GLOVE) ×2 IMPLANT
GLOVE BIOGEL PI IND STRL 7.0 (GLOVE) ×3 IMPLANT
GLOVE SURG SS PI 6.5 STRL IVOR (GLOVE) ×2 IMPLANT
GLOVE SURG SS PI 7.0 STRL IVOR (GLOVE) IMPLANT
GOWN STRL REUS W/TWL LRG LVL3 (GOWN DISPOSABLE) ×3 IMPLANT
GRASPER SUT TROCAR 14GX15 (MISCELLANEOUS) ×1 IMPLANT
HEMOSTAT SNOW SURGICEL 2X4 (HEMOSTASIS) IMPLANT
IRRIGATOR SUCT 8 DISP DVNC XI (IRRIGATION / IRRIGATOR) IMPLANT
IV NS IRRIG 3000ML ARTHROMATIC (IV SOLUTION) IMPLANT
KIT TURNOVER KIT A (KITS) ×1 IMPLANT
MANIFOLD NEPTUNE II (INSTRUMENTS) ×1 IMPLANT
NDL HYPO 21X1.5 SAFETY (NEEDLE) ×1 IMPLANT
NDL INSUFFLATION 14GA 120MM (NEEDLE) ×1 IMPLANT
NEEDLE HYPO 21X1.5 SAFETY (NEEDLE) ×1
NEEDLE INSUFFLATION 14GA 120MM (NEEDLE) ×1
OBTURATOR OPTICAL STND 8 DVNC (TROCAR) ×1
OBTURATOR OPTICALSTD 8 DVNC (TROCAR) ×1 IMPLANT
PACK LAP CHOLE LZT030E (CUSTOM PROCEDURE TRAY) ×1 IMPLANT
PAD ARMBOARD 7.5X6 YLW CONV (MISCELLANEOUS) ×1 IMPLANT
PENCIL HANDSWITCHING (ELECTRODE) IMPLANT
POSITIONER HEAD 8X9X4 ADT (SOFTGOODS) ×1 IMPLANT
SCISSORS MNPLR CVD DVNC XI (INSTRUMENTS) IMPLANT
SEAL UNIV 5-12 XI (MISCELLANEOUS) ×3 IMPLANT
SET BASIN LINEN APH (SET/KITS/TRAYS/PACK) ×1 IMPLANT
SET TUBE SMOKE EVAC HIGH FLOW (TUBING) ×1 IMPLANT
SUT MNCRL AB 4-0 PS2 18 (SUTURE) ×2 IMPLANT
SUT VICRYL 0 AB UR-6 (SUTURE) IMPLANT
SYR 30ML LL (SYRINGE) ×1 IMPLANT
SYS BAG RETRIEVAL 10MM (BASKET)
SYS RETRIEVAL 5MM INZII UNIV (BASKET) ×1
SYSTEM BAG RETRIEVAL 10MM (BASKET) IMPLANT
SYSTEM RETRIEVL 5MM INZII UNIV (BASKET) IMPLANT
WATER STERILE IRR 500ML POUR (IV SOLUTION) ×1 IMPLANT

## 2023-11-25 NOTE — Transfer of Care (Signed)
 Immediate Anesthesia Transfer of Care Note  Patient: Kristina Delacruz  Procedure(s) Performed: XI ROBOTIC ASSISTED LAPAROSCOPIC CHOLECYSTECTOMY (Abdomen)  Patient Location: PACU  Anesthesia Type:General  Level of Consciousness: sedated  Airway & Oxygen Therapy: Patient Spontanous Breathing and Patient connected to nasal cannula oxygen  Post-op Assessment: Report given to RN and Post -op Vital signs reviewed and stable  Post vital signs: Reviewed and stable  Last Vitals:  Vitals Value Taken Time  BP 98/57 11/25/23  1513  Temp 97.5 11/25/23   1513  Pulse 84 11/25/23 1512  Resp 17 11/25/23 1512  SpO2 99 % 11/25/23 1512  Vitals shown include unfiled device data.  Last Pain:  Vitals:   11/25/23 1245  TempSrc:   PainSc: 8       Patients Stated Pain Goal: 0 (11/24/23 1052)  Complications: No notable events documented.

## 2023-11-25 NOTE — Anesthesia Procedure Notes (Signed)
 Procedure Name: Intubation Date/Time: 11/25/2023 1:16 PM  Performed by: Barbarann Verneita RAMAN, CRNAPre-anesthesia Checklist: Patient identified, Patient being monitored, Timeout performed, Emergency Drugs available and Suction available Patient Re-evaluated:Patient Re-evaluated prior to induction Oxygen Delivery Method: Circle system utilized Preoxygenation: Pre-oxygenation with 100% oxygen Induction Type: IV induction and Cricoid Pressure applied Ventilation: Mask ventilation without difficulty Laryngoscope Size: Mac and 3 Grade View: Grade I Tube type: Oral Tube size: 7.0 mm Number of attempts: 1 Airway Equipment and Method: Stylet Placement Confirmation: ETT inserted through vocal cords under direct vision, positive ETCO2 and breath sounds checked- equal and bilateral Secured at: 21 cm Tube secured with: Tape Dental Injury: Teeth and Oropharynx as per pre-operative assessment

## 2023-11-25 NOTE — Anesthesia Postprocedure Evaluation (Signed)
 Anesthesia Post Note  Patient: Kristina Delacruz  Procedure(s) Performed: XI ROBOTIC ASSISTED LAPAROSCOPIC CHOLECYSTECTOMY (Abdomen)  Patient location during evaluation: PACU Anesthesia Type: General Level of consciousness: awake and alert Pain management: pain level controlled Vital Signs Assessment: post-procedure vital signs reviewed and stable Respiratory status: spontaneous breathing, nonlabored ventilation, respiratory function stable and patient connected to nasal cannula oxygen Cardiovascular status: blood pressure returned to baseline and stable Postop Assessment: no apparent nausea or vomiting Anesthetic complications: no   There were no known notable events for this encounter.   Last Vitals:  Vitals:   11/25/23 1547 11/25/23 1600  BP: 109/65 106/67  Pulse: 75 79  Resp: (!) 21 20  Temp:  36.6 C  SpO2: 99% 96%    Last Pain:  Vitals:   11/25/23 1600  TempSrc:   PainSc: 0-No pain                 Thurlow Gallaga L Tasha Jindra

## 2023-11-25 NOTE — Progress Notes (Signed)
 [facility] Progress Note     Subjective: Patient seen and examined.  She is resting comfortably in bed.  She continues to complain of diffuse abdominal pain with nausea.    Objective: Vital signs in last 24 hours: Temp:  [98.2 F (36.8 C)-98.4 F (36.9 C)] 98.4 F (36.9 C) (01/14 0600) Pulse Rate:  [76-82] 76 (01/14 0600) Resp:  [16-18] 16 (01/14 0600) BP: (130-156)/(80-88) 130/80 (01/14 0600) SpO2:  [98 %] 98 % (01/13 2117) Last BM Date : 11/22/23  Intake/Output from previous day: 01/13 0701 - 01/14 0700 In: 854.7 [P.O.:240; I.V.:614.7] Out: -  Intake/Output this shift: No intake/output data recorded.  General appearance: alert, cooperative, and no distress GI: Abdomen soft, nondistended, no percussion tenderness, TTP in upper abdomen; no rigidity, guarding, or rebound tenderness  Lab Results:  Recent Labs    11/24/23 0525 11/25/23 0504  WBC 13.5* 13.1*  HGB 13.4 13.5  HCT 40.2 39.5  PLT 218 237   BMET Recent Labs    11/24/23 0525 11/25/23 0504  NA 132* 136  K 3.2* 3.2*  CL 104 105  CO2 18* 19*  GLUCOSE 109* 104*  BUN <5* <5*  CREATININE 0.48 0.46  CALCIUM 8.6* 8.8*   PT/INR No results for input(s): LABPROT, INR in the last 72 hours.  Studies/Results: No results found.  Anti-infectives: Anti-infectives (From admission, onward)    Start     Dose/Rate Route Frequency Ordered Stop   11/25/23 0600  ceFAZolin  (ANCEF ) IVPB 2g/100 mL premix        2 g 200 mL/hr over 30 Minutes Intravenous On call to O.R. 11/24/23 1348 11/26/23 0559       Assessment/Plan:  Patient is a 43 year old female who was admitted with gallstone pancreatitis.  -Plan for robotic assisted laparoscopic cholecystectomy today -NPO -prophylactic antibiotics ordered -PRN pain control and antiemetics -IV fluids per primary team -Further recommendations to follow surgery -Appreciate hospitalist recommendations   LOS: 3 days    Avi Kerschner A  Alegandra Sommers 11/25/2023

## 2023-11-25 NOTE — Progress Notes (Signed)
 PROGRESS NOTE    Kristina Delacruz  FMW:[id number] DOB: [birth year] DOA: 11/22/2023 PCP: Zarwolo, Gloria, FNP   Brief Narrative:    Kristina Delacruz is a 43 y.o. female with medical history significant for GERD, anxiety/depression, aortic valve insufficiency, and mitral valve disorder who presented to the ED with complaints of right-sided abdominal pain that started 3 days ago.  Patient was admitted with presumed acute gallstone pancreatitis.  Diet is being advanced and laparoscopic cholecystectomy planned for later today.  Anticipate discharge after gradual dietary advancement in the next 1-2 days.  Assessment & Plan:   Principal Problem:   Acute pancreatitis Active Problems:   Irregular periods   Moody   Depression, major, single episode, moderate (HCC)   Class 1 obesity due to excess calories without serious comorbidity with body mass index (BMI) of 30.0 to 30.9 in adult   Aortic regurgitation   Gallstone pancreatitis  Assessment and Plan:   Acute gallstone pancreatitis with transaminitis-improving -Continue IV fluid as needed -IV morphine  as needed for pain management -Zofran  for nausea and vomiting along with Phenergan  for intractable nausea and vomiting symptoms. -Plan to try gradual dietary advancement after laparoscopic cholecystectomy 1/14 -Lipid panel with no triglyceride elevation -Continue to monitor repeat labs  Hypokalemia -Replete and reevaluate in a.m.  Headache/back pain -Resume home gabapentin  -Tylenol  muscle relaxers ordered as needed   Anxiety/depression -Continue home trazodone  and venlafaxine    GERD -IV PPI   Perimenopausal -Needs to continue drospirenone  4 mg daily for ongoing symptoms   Obesity, class I -BMI 30.91    DVT prophylaxis: Heparin  Code Status: Full Family Communication: Roommate at bedside 1/13 Disposition Plan: Continue IV medications and plan for laparoscopic cholecystectomy Status is: Inpatient Remains inpatient appropriate  because: Need for IV medications.   Consultants:  GI GS  Procedures:  None  Antimicrobials:  None   Subjective: Patient seen and evaluated today and continues to have some mild headache as well as nausea.  Anticipating laparoscopic cholecystectomy later today.  Objective: Vitals:   11/24/23 0842 11/24/23 1345 11/24/23 2117 11/25/23 0600  BP: (!) 149/80 (!) 156/87 (!) 153/88 130/80  Pulse: 88 80 82 76  Resp:  17 18 16   Temp:   98.2 F (36.8 C) 98.4 F (36.9 C)  TempSrc:    Oral  SpO2: 97% 98% 98%   Weight:      Height:        Intake/Output Summary (Last 24 hours) at 11/25/2023 1021 Last data filed at 11/24/2023 1627 Gross per 24 hour  Intake 854.71 ml  Output --  Net 854.71 ml   Filed Weights   11/22/23 0938 11/22/23 1652  Weight: 76.7 kg 77.5 kg    Examination:  General exam: Appears calm and comfortable  Respiratory system: Clear to auscultation. Respiratory effort normal. Cardiovascular system: S1 & S2 heard, RRR.  Gastrointestinal system: Abdomen is soft Central nervous system: Alert and awake Extremities: No edema Skin: No significant lesions noted Psychiatry: Flat affect.    Data Reviewed: I have personally reviewed following labs and imaging studies  CBC: Recent Labs  Lab 11/19/23 1819 11/22/23 1140 11/23/23 0347 11/24/23 0525 11/25/23 0504  WBC 6.1 11.1* 8.1 13.5* 13.1*  HGB 13.9 15.0 13.4 13.4 13.5  HCT 41.4 43.3 40.7 40.2 39.5  MCV 89.6 87.1 90.6 87.4 88.6  PLT 280 269 226 218 237   Basic Metabolic Panel: Recent Labs  Lab 11/19/23 1819 11/22/23 1140 11/23/23 0347 11/24/23 0525 11/25/23 0504  NA 135 135 136 132*  136  K 4.0 4.3 3.6 3.2* 3.2*  CL 106 106 106 104 105  CO2 21* 21* 21* 18* 19*  GLUCOSE 94 159* 93 109* 104*  BUN 9 8 6  <5* <5*  CREATININE 0.72 0.68 0.55 0.48 0.46  CALCIUM 9.2 9.2 8.3* 8.6* 8.8*  MG  --   --  2.0 1.9 1.8   GFR: Estimated Creatinine Clearance: 88.4 mL/min (by C-G formula based on SCr of 0.46  mg/dL). Liver Function Tests: Recent Labs  Lab 11/22/23 1140 11/23/23 0347 11/24/23 0525 11/25/23 0504  AST 1,406* 369* 77* 40  ALT 1,192* 719* 383* 267*  ALKPHOS 145* 119 102 90  BILITOT 2.9* 1.2 1.1 1.3*  PROT 7.8 6.5 6.7 7.3  ALBUMIN 4.1 3.5 3.5 3.5   Recent Labs  Lab 11/22/23 1140 11/23/23 0547 11/25/23 0504  LIPASE 3,303* 569* 40   No results for input(s): AMMONIA in the last 168 hours. Coagulation Profile: No results for input(s): INR, PROTIME in the last 168 hours. Cardiac Enzymes: No results for input(s): CKTOTAL, CKMB, CKMBINDEX, TROPONINI in the last 168 hours. BNP (last 3 results) No results for input(s): PROBNP in the last 8760 hours. HbA1C: No results for input(s): HGBA1C in the last 72 hours. CBG: No results for input(s): GLUCAP in the last 168 hours. Lipid Profile: Recent Labs    11/22/23 1140 11/23/23 0347  CHOL 207*  --   HDL 61  --   LDLCALC 131*  --   TRIG 77 61  CHOLHDL 3.4  --    Thyroid  Function Tests: No results for input(s): TSH, T4TOTAL, FREET4, T3FREE, THYROIDAB in the last 72 hours. Anemia Panel: No results for input(s): VITAMINB12, FOLATE, FERRITIN, TIBC, IRON, RETICCTPCT in the last 72 hours. Sepsis Labs: No results for input(s): PROCALCITON, LATICACIDVEN in the last 168 hours.  No results found for this or any previous visit (from the past 240 hours).       Radiology Studies: No results found.       Scheduled Meds:  bisacodyl   10 mg Rectal Daily   cycloSPORINE   1 drop Both Eyes BID   Drospirenone   1 tablet Oral Daily   gabapentin   200 mg Oral QHS   heparin   5,000 Units Subcutaneous Q8H   indocyanine green   2.5 mg Intravenous Once   pantoprazole   40 mg Oral BID   polyethylene glycol  17 g Oral Daily   potassium chloride   40 mEq Oral Once   senna-docusate  1 tablet Oral BID   traZODone   150 mg Oral QHS   venlafaxine  XR  75 mg Oral Q breakfast   Continuous  Infusions:   ceFAZolin  (ANCEF ) IV     promethazine  (PHENERGAN ) injection (IM or IVPB)       LOS: 3 days    Time spent: 55 minutes    Nyazia Canevari JONETTA Fairly, DO [facility]  If 7PM-7AM, please contact night-coverage [email or web address] 11/25/2023, 10:21 AM

## 2023-11-25 NOTE — Discharge Instructions (Signed)
 Ambulatory Surgery Discharge Instructions  Activity  You are advised to go directly home from the hospital.  Resume light activity. No heavy lifting over 10 lbs or strenuous exercise.  Fluids and Diet Regular diet  Medications  If you have not had a bowel movement in 24 hours, take 2 tablespoons over the counter Milk of mag.             You May resume your blood thinners tomorrow (Aspirin, coumadin, or other).   Operative Site  You have a liquid bandage over your incisions, this will begin to flake off in about a week. Ok to english as a second language teacher. Keep wound clean and dry. No baths or swimming. No lifting more than 10 pounds.  Contact Information: If you have questions or concerns, please call our office, [phone], Monday- Thursday 8AM-5PM and Friday 8AM-12Noon.  If it is after hours or on the weekend, please call [facility] Main Number, [phone], and ask to speak to the surgeon on call for Dr. Evonnie at [facility].   SPECIFIC COMPLICATIONS TO WATCH FOR: Inability to urinate Fever over 101? F by mouth Nausea and vomiting lasting longer than 24 hours. Pain not relieved by medication ordered Swelling around the operative site Increased redness, warmth, hardness, around operative area Numbness, tingling, or cold fingers or toes Blood -soaked dressing, (small amounts of oozing may be normal) Increasing and progressive drainage from surgical area or exam site

## 2023-11-25 NOTE — Op Note (Signed)
 [facility] Operative Note  11/25/23  Preoperative Diagnosis: Gallstone pancreatitis   Postoperative Diagnosis: Same   Procedure(s) Performed: Robotic Assisted Laparoscopic Cholecystectomy   Surgeon: Dorothyann Brittle, DO   Assistants: No qualified resident was available    Anesthesia: General endotracheal   Anesthesiologist: Landry Dunnings, MD    Specimens: Gallbladder   Estimated Blood Loss: Minimal   Blood Replacement: None    Complications: None   Wound Class: Clean contaminated   Operative Indications: The patient was admitted with pancreatitis with elevated LFTs and gallstones on imaging.  Given presumed gallstone pancreatitis, recommended inpatient cholecystectomy.  We discussed the risk of the procedure including but not limited to bleeding, infection, injury to the common bile duct, bile leak, need for further procedures, chance of subtotal cholecystectomy.   Findings:  Minimally inflamed gallbladder Critical view of safety noted All clips intact at the end of the case Adequate hemostasis   Procedure: Firefly was given in the preoperative area. The patient was taken to the operating room and placed supine. General endotracheal anesthesia was induced. Intravenous antibiotics were administered per protocol.  An orogastric tube positioned to decompress the stomach. The abdomen was prepared and draped in the usual sterile fashion. A time-out was completed verifying correct patient, procedure, site, positioning, and implant(s) and/or special equipment prior to beginning this procedure.  Veress needle was placed at the infraumbilical area and insufflation was started after confirming a positive saline drop test and no immediate increase in abdominal pressure.  After reaching 15 mm, the Veress needle was removed and a 8 mm port was placed via optiview technique infraumbilical, measuring 20 mm away from the suspected position of the gallbladder.  The  abdomen was inspected and no abnormalities or injuries were found.  Under direct vision, ports were placed in the following locations in a semi curvilinear position around the target of the gallbladder: Two 8 mm ports on the patient's right each having 8cm clearance to the adjacent ports and one 8 mm port placed on the patient's left 8 cm from the umbilical port. Once ports were placed, the table was placed in the reverse Trendelenburg position with the right side up. The Xi platform was brought into the operative field and docked to the ports successfully.  An endoscope was placed through the umbilical port, prograsp through the most lateral right port, fenestrated bipolar to the port just right of the umbilicus, and then a hook cautery in the left port.   The dome of the gallbladder was grasped with prograsp and retracted over the dome of the liver. Adhesions between the gallbladder and omentum, duodenum and transverse colon were lysed via hook cautery. The infundibulum was grasped with the fenestrated grasper and retracted toward the right lower quadrant. This maneuver exposed Calot's triangle. Firefly was used throughout the dissection to ensure safe visualization of the cystic duct.  The peritoneum overlying the gallbladder infundibulum was then dissected and the cystic duct and cystic artery identified.  Critical view of safety with the liver bed clearly visible behind the duct and artery with no additional structures noted.  The cystic duct and cystic artery were doubly clipped and divided close to the gallbladder.    The gallbladder was then dissected from its peritoneal and liver bed attachments by electrocautery. Hemostasis was checked prior to removing the hook cautery.  The Anton was undocked and moved out of the field.  A 5mm Endo Catch bag was then placed through the umbilical port and the gallbladder was  removed.  The gallbladder was passed off the table as a specimen. There was no evidence of  bleeding from the gallbladder fossa or cystic artery or leakage of the bile from the cystic duct stump. The umbilical port site closed with a 0 vicryl with a PMI needle.  The abdomen was desufflated and secondary trocars were removed under direct vision. No bleeding was noted. Incisions were localized with marcaine .  All skin incisions were closed with subcuticular sutures of 4-0 monocryl and dermabond.   Final inspection revealed acceptable hemostasis. All counts were correct at the end of the case. The patient was awakened from anesthesia and extubated without complication. The OG tube was removed.  The patient went to the PACU in stable condition.   Dorothyann Brittle, DO [facility] [address] [place], [place] [address] [phone] (office)

## 2023-11-25 NOTE — Anesthesia Preprocedure Evaluation (Addendum)
 Anesthesia Evaluation  Patient identified by MRN, date of birth, ID band Patient awake    Reviewed: Allergy & Precautions, H&P , NPO status , Patient's Chart, lab work & pertinent test results, reviewed documented beta blocker date and time   Airway Mallampati: II  TM Distance: >3 FB Neck ROM: Full    Dental no notable dental hx. (+) Dental Advisory Given, Teeth Intact   Pulmonary former smoker   Pulmonary exam normal breath sounds clear to auscultation       Cardiovascular Exercise Tolerance: Good hypertension, Normal cardiovascular exam+ Valvular Problems/Murmurs AI  Rhythm:regular Rate:Normal  Mitral valve disorder   Neuro/Psych  PSYCHIATRIC DISORDERS  Depression    negative neurological ROS     GI/Hepatic Neg liver ROS,GERD  Medicated,,  Endo/Other  negative endocrine ROS    Renal/GU negative Renal ROS  negative genitourinary   Musculoskeletal  (+) Arthritis , Osteoarthritis,    Abdominal   Peds  Hematology negative hematology ROS (+)   Anesthesia Other Findings   Reproductive/Obstetrics negative OB ROS                              Anesthesia Physical Anesthesia Plan  ASA: 2  Anesthesia Plan: General   Post-op Pain Management: Dilaudid  IV   Induction:   PONV Risk Score and Plan: 3 and Ondansetron , Dexamethasone  and Midazolam   Airway Management Planned: Oral ETT  Additional Equipment: None  Intra-op Plan:   Post-operative Plan: Extubation in OR  Informed Consent: I have reviewed the patients History and Physical, chart, labs and discussed the procedure including the risks, benefits and alternatives for the proposed anesthesia with the patient or authorized representative who has indicated his/her understanding and acceptance.     Dental Advisory Given  Plan Discussed with: CRNA  Anesthesia Plan Comments:          Anesthesia Quick Evaluation

## 2023-11-25 NOTE — Progress Notes (Signed)
 [facility]  Spoke with the patient's girlfriend on the phone.  I explained that she tolerated the procedure without difficulty.  She has dissolvable stitches under the skin with overlying skin glue.  This will flake off in 10 to 14 days.  She will return to her room on the floor, and we will work on pain control and advancing her diet.  The patient will follow-up with me in 2 weeks for phone follow-up after discharge.  All questions were answered to her expressed satisfaction.  Plan: -Return to room on floor -CLD -Labs tomorrow AM -No need for further antibiotics from surgical standpoint -IVF per primary team -PRN pain control and antiemetics -Appreciate hospitalist recommendations  Dorothyann Brittle, DO [facility] [address] [place] [place], [place] [address] [phone] (office)

## 2023-11-26 ENCOUNTER — Encounter (HOSPITAL_COMMUNITY): Payer: Self-pay | Admitting: Internal Medicine

## 2023-11-26 DIAGNOSIS — K851 Biliary acute pancreatitis without necrosis or infection: Secondary | ICD-10-CM | POA: Diagnosis not present

## 2023-11-26 LAB — COMPREHENSIVE METABOLIC PANEL
ALT: 173 U/L — ABNORMAL HIGH (ref 0–44)
AST: 34 U/L (ref 15–41)
Albumin: 3.2 g/dL — ABNORMAL LOW (ref 3.5–5.0)
Alkaline Phosphatase: 80 U/L (ref 38–126)
Anion gap: 10 (ref 5–15)
BUN: 6 mg/dL (ref 6–20)
CO2: 18 mmol/L — ABNORMAL LOW (ref 22–32)
Calcium: 9.3 mg/dL (ref 8.9–10.3)
Chloride: 109 mmol/L (ref 98–111)
Creatinine, Ser: 0.54 mg/dL (ref 0.44–1.00)
GFR, Estimated: >60 mL/min (ref >60)
Glucose, Bld: 111 mg/dL — ABNORMAL HIGH (ref 70–99)
Potassium: 3.8 mmol/L (ref 3.5–5.1)
Sodium: 137 mmol/L (ref 135–145)
Total Bilirubin: 1 mg/dL (ref 0.0–1.2)
Total Protein: 6.9 g/dL (ref 6.5–8.1)

## 2023-11-26 LAB — MAGNESIUM: Magnesium: 2 mg/dL (ref 1.7–2.4)

## 2023-11-26 LAB — CBC
HCT: 36.6 % (ref 36.0–46.0)
Hemoglobin: 12 g/dL (ref 12.0–15.0)
MCH: 29.6 pg (ref 26.0–34.0)
MCHC: 32.8 g/dL (ref 30.0–36.0)
MCV: 90.1 fL (ref 80.0–100.0)
Platelets: 255 10*3/uL (ref 150–400)
RBC: 4.06 MIL/uL (ref 3.87–5.11)
RDW: 12.9 % (ref 11.5–15.5)
WBC: 10 10*3/uL (ref 4.0–10.5)
nRBC: 0 % (ref 0.0–0.2)

## 2023-11-26 MED ORDER — MONTELUKAST SODIUM 10 MG PO TABS
10.0000 mg | ORAL_TABLET | Freq: Every day | ORAL | Status: DC
Start: 2023-11-26 — End: 2023-11-28
  Administered 2023-11-26 – 2023-11-27 (×2): 10 mg via ORAL
  Filled 2023-11-26 (×2): qty 1

## 2023-11-26 NOTE — Progress Notes (Signed)
 [facility] Progress Note  1 Day Post-Op  Subjective: Patient seen and examined.  She is resting comfortably in bed.  She was able to tolerate some clear liquids last night without nausea and vomiting.  She denies flatus and bowel movements.  She did not have much pain last night but required pain medication this morning.  She also started feeling a little more nauseous this morning.  Objective: Vital signs in last 24 hours: Temp:  [97.5 F (36.4 C)-97.8 F (36.6 C)] 97.7 F (36.5 C) (01/15 0403) Pulse Rate:  [75-102] 82 (01/15 0403) Resp:  [17-25] 20 (01/15 0403) BP: (92-161)/(57-94) 104/68 (01/15 0403) SpO2:  [95 %-99 %] 95 % (01/15 0403) Last BM Date : 11/22/23  Intake/Output from previous day: 01/14 0701 - 01/15 0700 In: 1310 [P.O.:110; I.V.:1100; IV Piggyback:100] Out: 10 [Blood:10] Intake/Output this shift: No intake/output data recorded.  General appearance: alert, cooperative, and no distress GI: Abdomen soft, nondistended, no percussion tenderness, mild incisional and upper abdominal TTP, no rigidity, guarding, or rebound tenderness; mild ecchymosis at the umbilical incision site  Lab Results:  Recent Labs    11/25/23 0504 11/26/23 0508  WBC 13.1* 10.0  HGB 13.5 12.0  HCT 39.5 36.6  PLT 237 255   BMET Recent Labs    11/25/23 0504 11/26/23 0508  NA 136 137  K 3.2* 3.8  CL 105 109  CO2 19* 18*  GLUCOSE 104* 111*  BUN <5* 6  CREATININE 0.46 0.54  CALCIUM 8.8* 9.3   PT/INR No results for input(s): "LABPROT", "INR" in the last 72 hours.  Studies/Results: No results found.  Anti-infectives: Anti-infectives (From admission, onward)    Start     Dose/Rate Route Frequency Ordered Stop   11/25/23 0600  ceFAZolin  (ANCEF ) IVPB 2g/100 mL premix        2 g 200 mL/hr over 30 Minutes Intravenous On call to O.R. 11/24/23 1348 11/25/23 1346       Assessment/Plan:  Patient is a 43 year old female who was admitted with gallstone  pancreatitis.  She is s/p robotic assisted laparoscopic cholecystectomy on 1/14.  -Leukocytosis has resolved.  No need for antibiotics from surgical standpoint -LFTs continuing to improve -Will advance to full liquid diet -PRN pain control and antiemetics -Discussed that patient will not receive a prescription for narcotics at the time of discharge with her pain contract.  Advised that she should take schedule Tylenol  every 6 hours with doses of ibuprofen  between the Tylenol  doses -Most of her pain and nausea symptoms are related to her ongoing pancreatitis -Bowel regimen ordered.  Advised that she should use the suppository today to try to improve her constipation -Anticipate she will be stable for discharge in the next 24-48 hours -Care per primary team   LOS: 4 days    Kristelle Cavallaro A Arleen Bar 11/26/2023

## 2023-11-26 NOTE — Plan of Care (Signed)
   Problem: Education: Goal: Knowledge of General Education information will improve Description Including pain rating scale, medication(s)/side effects and non-pharmacologic comfort measures Outcome: Progressing

## 2023-11-26 NOTE — Progress Notes (Signed)
 Mobility Specialist Progress Note:    11/26/23 1448  Mobility  Activity Ambulated with assistance in hallway  Level of Assistance Modified independent, requires aide device or extra time  Assistive Device None  Distance Ambulated (ft) 140 ft  Range of Motion/Exercises Active;All extremities  Activity Response Tolerated well  Mobility Referral Yes  Mobility visit 1 Mobility  Mobility Specialist Start Time (ACUTE ONLY) 1430  Mobility Specialist Stop Time (ACUTE ONLY) 1445  Mobility Specialist Time Calculation (min) (ACUTE ONLY) 15 min   Pt received in bed, agreeable to mobility. ModI to stand and ambulate with no AD. Tolerated well, rated abdomen pain 5/10. Returned to room, left pt sitting EOB. All needs met.   Glinda Lapping Mobility Specialist Please contact via Special educational needs teacher or  Rehab office at [phone]

## 2023-11-26 NOTE — Progress Notes (Signed)
 [facility] PROGRESS NOTE  Kristina Delacruz (DOB: 02-18-1981) HYQ:[id number] PCP: Zarwolo, Gloria, FNP Brief Narrative: Kristina Delacruz is a 43 y.o. female with a history of anxiety/depression, allergies, GERD who presented to the ED on 11/22/2023 with abdominal pain, subsequently admitted for gallstone pancreatitis. Ultimately underwent laparoscopic cholecystectomy 1/14.   Subjective: Pain in abdomen is waxing/waning, not necessarily worse after eating grits w/butter this AM. No flatus or BM since surgery. Eager to get home to her own bed. Daughter on facetime throughout encounter.   Objective: BP 104/68 (BP Location: Left Arm)   Pulse 82   Temp 97.7 F (36.5 C) (Oral)   Resp 20   Ht 5\' 2"  (1.575 m)   Wt 77.5 kg   SpO2 95%   BMI 31.24 kg/m   Gen: No distress Pulm: Clear, nonlabored  CV: RRR, no MRG, no pitting edema  GI: Soft, mildly distended, appropriately tender without rebound or guarding. Laparoscopic sites c/d/I with dermabond, mild settling ecchymosis under umbilical site. Dispersed lower abd ecchymoses from heparin  injections as well. Hypoactive BS.  Neuro: Alert and oriented. No new focal deficits. Ext: Warm, no deformities Skin: No other rashes, lesions or ulcers on visualized skin   Assessment & Plan: Principal Problem:   Acute pancreatitis Active Problems:   Irregular periods   Moody   Depression, major, single episode, moderate (HCC)   Class 1 obesity due to excess calories without serious comorbidity with body mass index (BMI) of 30.0 to 30.9 in adult   Aortic regurgitation   Gallstone pancreatitis  Acute gallstone pancreatitis: Improving. No severe hypertriglyceridemia nor EtOH abuse.  - s/p robotic assisted laparoscopic cholecystectomy 11/25/2023 by Dr. Cherilyn Corn. Postoperative care, diet advancement (CLD > FLD today) per surgery. Due to pain contract as outpatient, no opioid analgesic prescriptions will be provided.  - LFTs continue improvement, can  recheck at follow up. WBC normalized. No longer on abx postoperatively.  - Continue supportive care for pancreatitis, symptoms of which are improving. GI has signed off. Bilirubin normalized.   Constipation: No flatus or BM since surgery (<24hrs), exam not peritonitic.  - Bowel regimen per surgery.   GERD:  - PPI  Hypokalemia: Resolved.  - Supplement as needed  Allergies:  - Restart home singulair  - Continue oral antihistamine, formulary claritin , though if pt prefers, she can take zyrtec  in its place.   Depression: Quiescent.  - Continue home medications including trazodone , venlafaxine    Chronic back pain:  - Continue tylenol  and antispasmodics. Anticipate improvement once mobility improves.   Mild aortic insufficiency: Noted at Echo Dec 2024 for which her cardiologist, Dr. Arthea Larsson, recommends surveillance echocardiogram again in 3 years. Does not appear to be a hemodynamically significant lesion.   Perimenopause:  - Continue home drospirenone    Obesity: Body mass index is 31.24 kg/m.   Wynetta Heckle, MD [facility] [email or web address] 11/26/2023, 10:54 AM

## 2023-11-27 DIAGNOSIS — K851 Biliary acute pancreatitis without necrosis or infection: Secondary | ICD-10-CM | POA: Diagnosis not present

## 2023-11-27 LAB — SURGICAL PATHOLOGY

## 2023-11-27 NOTE — Progress Notes (Signed)
Ate most of hamburger for supper.  Continues passing gas but no bm

## 2023-11-27 NOTE — Plan of Care (Signed)
  Problem: Health Behavior/Discharge Planning: Goal: Ability to manage health-related needs will improve Outcome: Progressing   

## 2023-11-27 NOTE — Progress Notes (Signed)
Received soft lunch tray and ate about half of it.  Has had pain rated a 7 today but no increased pain since eating lunch tray.  Has a lot of flatus and says since eating her stomach is "rumbling" more.  No nausea.  Having very small mucus bms.  Sites to abd dry and intact. Ambulating to bathroom

## 2023-11-27 NOTE — Progress Notes (Signed)
Pt required several doses of PRN's overnight. She was able to have small mucus like stool. No blood observed. She reports she was able to pass gas. Still c/o low back pain.  No acute events overnight. Kellogg RN

## 2023-11-27 NOTE — Progress Notes (Signed)
[facility] PROGRESS NOTE  Kristina Delacruz (DOB: 12/21/1980) UEA:[id number] PCP: Gilmore Laroche, FNP Brief Narrative: Kristina Delacruz is a 43 y.o. female with a history of anxiety/depression, allergies, GERD who presented to the ED on 11/22/2023 with abdominal pain, subsequently admitted for gallstone pancreatitis. Ultimately underwent laparoscopic cholecystectomy 1/14.   Subjective: Required prn pain medications overnight, tolerating full liquid diet but says she's starving.   Objective: BP 118/72 (BP Location: Left Arm)   Pulse 88   Temp 98 F (36.7 C) (Oral)   Resp 20   Ht 5\' 2"  (1.575 m)   Wt 77.5 kg   SpO2 94%   BMI 31.24 kg/m   No distress Clear after initial cough, nonlabored RRR Soft, appropriately tender without rebound, diffuse, +BS  Assessment & Plan: Principal Problem:   Acute pancreatitis Active Problems:   Irregular periods   Moody   Depression, major, single episode, moderate (HCC)   Class 1 obesity due to excess calories without serious comorbidity with body mass index (BMI) of 30.0 to 30.9 in adult   Aortic regurgitation   Gallstone pancreatitis  Acute gallstone pancreatitis: Improving. No severe hypertriglyceridemia nor EtOH abuse.  - s/p robotic assisted laparoscopic cholecystectomy 11/25/2023 by Dr. Robyne Peers. Postoperative care, diet advancement (FLD currently) per surgery. Due to pain contract as outpatient, no opioid analgesic prescriptions will be provided.  - LFTs continue improvement, WBC normalized. No longer on abx postoperatively.  - Continue supportive care for pancreatitis, symptoms of which are improving. GI has signed off. Bilirubin normalized.   History of pneumonia: Follow up CXR was recommended several weeks ago, CXR from 12/17 personally reviewed with opacity RLL which is resolved on CXR this admission from 1/8.  - Continue incentive spirometry, mobilizing regularly.   Constipation: No flatus or BM since surgery (<24hrs), exam  not peritonitic.  - Bowel regimen per surgery.   GERD:  - PPI  Hypokalemia: Resolved.  - Supplement as needed  Allergies:  - Restart home singulair - Continue oral antihistamine, formulary claritin, though if pt prefers, she can take zyrtec in its place.   Depression: Quiescent.  - Continue home medications including trazodone, venlafaxine   Chronic back pain:  - Continue tylenol and antispasmodics. Anticipate improvement once mobility improves.   Mild aortic insufficiency: Noted at Echo Dec 2024 for which her cardiologist, Dr. Jenene Slicker, recommends surveillance echocardiogram again in 3 years. Does not appear to be a hemodynamically significant lesion.   Perimenopause:  - Continue home drospirenone   Obesity: Body mass index is 31.24 kg/m.   Tyrone Nine, MD [facility] [email or web address] 11/27/2023, 10:54 AM

## 2023-11-27 NOTE — Progress Notes (Signed)
[facility] Progress Note  2 Days Post-Op  Subjective: Patient seen and examined.  She is resting comfortably in bed.  She just had some solid food, and so far, denies nausea and vomiting.  She confirms pain though it is improved with pain medications.  She confirms passing flatus and a small bowel movement.  Objective: Vital signs in last 24 hours: Temp:  [97.9 F (36.6 C)-98 F (36.7 C)] 98 F (36.7 C) (01/16 0352) Pulse Rate:  [87-96] 88 (01/16 0352) Resp:  [18-20] 20 (01/16 0352) BP: (110-133)/(72-80) 118/72 (01/16 0352) SpO2:  [94 %-98 %] 94 % (01/16 0352) Last BM Date : 11/27/23  Intake/Output from previous day: 01/15 0701 - 01/16 0700 In: 290 [P.O.:240; IV Piggyback:50] Out: -  Intake/Output this shift: Total I/O In: 120 [P.O.:120] Out: -   General appearance: alert, cooperative, and no distress GI: Abdomen soft, nondistended, no percussion tenderness, mild incisional TTP; no rigidity, guarding, or rebound tenderness; incisions C/D/I with dermabond in place, ecchymosis at umbilical incision site  Lab Results:  Recent Labs    11/25/23 0504 11/26/23 0508  WBC 13.1* 10.0  HGB 13.5 12.0  HCT 39.5 36.6  PLT 237 255   BMET Recent Labs    11/25/23 0504 11/26/23 0508  NA 136 137  K 3.2* 3.8  CL 105 109  CO2 19* 18*  GLUCOSE 104* 111*  BUN <5* 6  CREATININE 0.46 0.54  CALCIUM 8.8* 9.3   PT/INR No results for input(s): "LABPROT", "INR" in the last 72 hours.  Studies/Results: No results found.  Anti-infectives: Anti-infectives (From admission, onward)    Start     Dose/Rate Route Frequency Ordered Stop   11/25/23 0600  ceFAZolin (ANCEF) IVPB 2g/100 mL premix        2 g 200 mL/hr over 30 Minutes Intravenous On call to O.R. 11/24/23 1348 11/25/23 1346       Assessment/Plan:  Patient is a 43 year old female who was admitted with gallstone pancreatitis.  She is s/p robotic assisted laparoscopic cholecystectomy on 1/14.   -Advanced  to soft diet -PRN pain control and antiemetics -Discussed that patient will not receive a prescription for narcotics at the time of discharge with her pain contract.  Advised that she should take schedule Tylenol every 6 hours with doses of ibuprofen between the Tylenol doses -Continue bowel regimen.   -Once she tolerates solid food without nausea and vomiting, she will be stable for discharge home from surgical standpoint -I will call her for a phone follow up in 2 weeks -Recommend outpatient follow up with GI for chronic constipation -Care per primary team   LOS: 5 days    Kristina Delacruz 11/27/2023

## 2023-11-28 DIAGNOSIS — K851 Biliary acute pancreatitis without necrosis or infection: Secondary | ICD-10-CM | POA: Diagnosis not present

## 2023-11-28 NOTE — Plan of Care (Signed)
  Problem: Education: Goal: Knowledge of General Education information will improve Description Including pain rating scale, medication(s)/side effects and non-pharmacologic comfort measures Outcome: Progressing   Problem: Health Behavior/Discharge Planning: Goal: Ability to manage health-related needs will improve Outcome: Progressing   

## 2023-11-28 NOTE — Discharge Summary (Signed)
Physician Discharge Summary   Patient: Kristina Delacruz MRN: [id number] DOB: 1981/05/03  Admit date:     11/22/2023  Discharge date: 11/28/23  Discharge Physician: Tyrone Nine   PCP: Gilmore Laroche, FNP   Recommendations at discharge:  Follow up with PCP in next 1-2 weeks per routine for post-hospital evaluation.  Follow up with [facility] for ongoing chronic constipation. Surgery plans to follow up with patient in about 2 weeks for postoperative check.   Discharge Diagnoses: Principal Problem:   Acute pancreatitis Active Problems:   Irregular periods   Moody   Depression, major, single episode, moderate (HCC)   Class 1 obesity due to excess calories without serious comorbidity with body mass index (BMI) of 30.0 to 30.9 in adult   Aortic regurgitation   Gallstone pancreatitis  Hospital Course: Kristina Delacruz is a 43 y.o. female with a history of anxiety/depression, allergies, GERD who presented to the ED on 11/22/2023 with abdominal pain, subsequently admitted for gallstone pancreatitis. Ultimately underwent laparoscopic cholecystectomy 1/14. Recovery has been slow but uncomplicated.  Assessment and Plan: Acute gallstone pancreatitis: Improving. No severe hypertriglyceridemia nor EtOH abuse.  - s/p xi robotic assisted laparoscopic cholecystectomy 11/25/2023 by Dr. Robyne Peers. Postoperative care has been guided by surgery who has cleared her for discharge since she's showing return of bowel function and tolerating diet. Due to pain contract as outpatient, no opioid analgesic prescriptions will be provided.  - LFTs continue improvement, WBC normalized. No longer on abx postoperatively.  - Continue supportive care for pancreatitis, symptoms of which are improving. GI has signed off. Bilirubin normalized.    History of pneumonia: Follow up CXR was recommended several weeks ago, CXR from 12/17 personally reviewed with opacity RLL which is resolved on CXR this admission from 1/8.  -  Continue incentive spirometry, mobilizing regularly.    Constipation: No flatus or BM since surgery (<24hrs), exam not peritonitic.  - Bowel regimen per surgery. They recommend ongoing GI follow up.   GERD:  - PPI   Hypokalemia: Resolved.  - Supplement as needed   Allergies:  - Continue home zyrtec and singulair   Depression: Quiescent.  - Continue home medications including trazodone, venlafaxine    Chronic back pain:  - Continue tylenol and antispasmodics. Anticipate improvement once mobility improves.    Mild aortic insufficiency: Noted at [facility] Dec 2024 for which her cardiologist, Dr. Jenene Slicker, recommends surveillance echocardiogram again in 3 years. Does not appear to be a hemodynamically significant lesion.    Perimenopause:  - Continue home drospirenone    Obesity: Body mass index is 31.24 kg/m.   Consultants: GI, surgery Procedures performed:  11/25/23 XI ROBOTIC ASSISTED LAPAROSCOPIC CHOLECYSTECTOMY Pappayliou, Gustavus Messing, DO  Disposition: Home Diet recommendation: Low fat DISCHARGE MEDICATION: Allergies as of 11/28/2023       Reactions   Bee Venom Swelling   Fentanyl Nausea And Vomiting   Prefers morphine if need be   Onion Rash   Tape Rash        Medication List     TAKE these medications    calcium carbonate 500 MG chewable tablet Commonly known as: TUMS - dosed in mg elemental calcium Chew 1 tablet by mouth daily as needed for indigestion or heartburn.   cyclobenzaprine 5 MG tablet Commonly known as: FLEXERIL Take 5 mg by mouth daily.   gabapentin 100 MG capsule Commonly known as: NEURONTIN TAKE 2 CAPSULES BY MOUTH AT NIGHT   MAGNESIUM PO Take 1 tablet by mouth daily as  needed (acid reflux).   montelukast 10 MG tablet Commonly known as: SINGULAIR TAKE 1 TABLET BY MOUTH EVERYDAY AT BEDTIME   oxyCODONE-acetaminophen 5-325 MG tablet Commonly known as: PERCOCET/ROXICET Take 1 tablet by mouth daily as needed for moderate pain (pain score  4-6).   pantoprazole 40 MG tablet Commonly known as: PROTONIX Take 1 tablet (40 mg total) by mouth 2 (two) times daily.   PROBIOTIC-PREBIOTIC PO Take 1 capsule by mouth daily.   Restasis 0.05 % ophthalmic emulsion Generic drug: cycloSPORINE Place 1 drop into both eyes 2 (two) times daily.   Slynd 4 MG Tabs Generic drug: Drospirenone Take 1 tablet (4 mg total) by mouth daily.   traZODone 150 MG tablet Commonly known as: DESYREL TAKE 1 TABLET BY MOUTH EVERYDAY AT BEDTIME   venlafaxine XR 75 MG 24 hr capsule Commonly known as: EFFEXOR-XR TAKE 1 CAPSULE BY MOUTH DAILY WITH BREAKFAST.   ZYRTEC PO Take 1 tablet by mouth daily.        Follow-up Information     Pappayliou, Gustavus Messing, DO. Call.   Specialty: General Surgery Why: I will call you for a phone follow up in 2 weeks Contact information: [address] [place] [facility] [address] [phone]         Dolores Frame, MD. Call in 1 week(s).   Specialty: Gastroenterology Why: For follow up after hospitalization Contact information: [address] [place] [address] [phone]         Gilmore Laroche, FNP Follow up.   Specialty: Family Medicine Contact information: [address] [place] [address] [place] [address] [phone]                Discharge Exam: [name] Weights   11/22/23 0938 11/22/23 1652  Weight: 76.7 kg 77.5 kg  BP 132/88 (BP Location: Left Arm)   Pulse 97   Temp 98.4 F (36.9 C) (Oral)   Resp 18   Ht 5\' 2"  (1.575 m)   Wt 77.5 kg   SpO2 96%   BMI 31.24 kg/m   No distress Clear, nonlabored RRR Soft, protuberant/distended with diffuse mild tenderness, no rebound. Ecchymoses stable, laparoscopic incision sites are c/d/I with dermabond.  Condition at discharge: stable  The results of significant diagnostics from this hospitalization (including imaging, microbiology, ancillary and laboratory) are listed below for reference.   Imaging Studies: CT  ABDOMEN PELVIS W CONTRAST Result Date: 11/22/2023 CLINICAL DATA:  Acute nonlocalized abdominal pain. Right-sided abdominal pain since Wednesday. Vomiting and nausea last night with sweats and bowel movements this morning. EXAM: CT ABDOMEN AND PELVIS WITH CONTRAST TECHNIQUE: Multidetector CT imaging of the abdomen and pelvis was performed using the standard protocol following bolus administration of intravenous contrast. RADIATION DOSE REDUCTION: This exam was performed according to the departmental dose-optimization program which includes automated exposure control, adjustment of the mA and/or kV according to patient size and/or use of iterative reconstruction technique. CONTRAST:  OMNIPAQUE IOHEXOL 300 MG/ML  SOLN COMPARISON:  Ultrasound abdomen 11/22/2023. CT abdomen and pelvis 04/25/2022 FINDINGS: Lower chest: Dependent atelectasis in the lung bases. Hepatobiliary: Mild diffuse fatty infiltration of the liver with focal fatty infiltration in the left. Diffusely increased density in the gallbladder with multiple stones present as seen on prior ultrasound. Mild pericholecystic edema is nonspecific. No bile duct dilatation. Pancreas: Pancreatic head is enlarged and hypoenhancing with stranding around the pancreas, porta hepatis, and extending into the anterior pericolic gutters with fluid along the right pericolic gutter almost to the pelvis. Changes  are likely to indicate acute interstitial edematous pancreatitis. No abscess or necrosis identified. Spleen: Normal in size without focal abnormality. Adrenals/Urinary Tract: Adrenal glands are unremarkable. Kidneys are normal, without renal calculi, focal lesion, or hydronephrosis. Bladder is unremarkable. Stomach/Bowel: Stomach, small bowel, and colon are not abnormally distended. No wall thickening or inflammatory changes are appreciated. Appendix is normal. Vascular/Lymphatic: No significant vascular findings are present. No enlarged abdominal or pelvic lymph  nodes. Reproductive: Uterus and ovaries are not enlarged. Small left ovarian cysts measuring up to about 1.6 cm diameter, likely physiologic. No imaging follow-up is indicated. Other: No free air in the abdomen. Abdominal wall musculature appears intact. Musculoskeletal: No acute or significant osseous findings. IMPRESSION: 1. Inflammatory changes centered at the head of the pancreas with stranding and fluid tracking along the pericolic gutters, more prominent on the right. Changes are consistent with acute interstitial edematous pancreatitis. No abscess or necrosis identified. 2. Cholelithiasis. Mild pericholecystic edema is likely reactive. No bile duct dilatation. 3. Left ovarian simple-appearing cyst measuring 1.6 cm. No follow-up imaging is recommended. Reference: JACR 2020 Feb;17(2):248-254 Electronically Signed   By: Burman Nieves M.D.   On: 11/22/2023 13:11   US Abdomen Limited RUQ (LIVER/GB) Result Date: 11/22/2023 CLINICAL DATA:  RIGHT upper quadrant pain.  Nausea and vomiting. EXAM: ULTRASOUND ABDOMEN LIMITED RIGHT UPPER QUADRANT COMPARISON:  CT abdomen 04/25/2022 FINDINGS: Gallbladder: There is dense posterior shadowing obscuring much of the gallbladder with hyper echogenic superficial wall (so-called wall echo sign). Findings are consistent with multiple gallstones packed within lumen the gallbladder. Gallbladder wall is mildly thickened 7 mm. No clear sonographic Murphy's sign however sonography reports generalized abdominal pain. Common bile duct: Diameter: Upper limits of normal at 7 mm. Liver: No focal lesion identified. Within normal limits in parenchymal echogenicity. Portal vein is patent on color Doppler imaging with normal direction of blood flow towards the liver. Other: None. IMPRESSION: 1. Multiple gallstones pack the lumen of the gallbladder without clear evidence acute cholecystitis. 2. Common bile duct upper limits of normal. 3. No clear sonographic Murphy's sign. 4. Additional  imaging with CT or MRI/MRCP may be appropriate. Electronically Signed   By: Genevive Bi M.D.   On: 11/22/2023 11:16   DG Chest 2 View Result Date: 11/19/2023 CLINICAL DATA:  Chest pain EXAM: CHEST - 2 VIEW COMPARISON:  10/28/2023 FINDINGS: The heart size and mediastinal contours are within normal limits. Low lung volumes with basilar atelectasis. Otherwise no focal consolidation, pleural effusion, or pneumothorax. No displaced rib fractures. IMPRESSION: No active cardiopulmonary disease. Electronically Signed   By: Minerva Fester M.D.   On: 11/19/2023 19:12    Microbiology: Results for orders placed or performed in visit on 10/08/22  Coronavirus (COVID-19) with Influenza A and Influenza B     Status: Abnormal   Collection Time: 10/08/22 11:48 AM   Specimen: Nasopharyngeal  Result Value Ref Range Status   SARS-CoV-2, NAA Detected (A) Not Detected Final    Comment: Patients who have a positive COVID-19 test result may now have treatment options. Treatment options are available for patients with mild to moderate symptoms and for hospitalized patients. Visit our website at [email or web address] for resources and information.    Influenza A, NAA Not Detected Not Detected Final   Influenza B, NAA Not Detected Not Detected Final   Test Information: Comment  Final    Comment: This nucleic acid amplification test was developed and its performance characteristics determined by [employer]. Nucleic acid amplification tests include RT-PCR and TMA. This  test has not been FDA cleared or approved. This test has been authorized by FDA under an Emergency Use Authorization (EUA). This test is only authorized for the duration of time the declaration that circumstances exist justifying the authorization of the emergency use of in vitro diagnostic tests for detection of SARS-CoV-2 virus and/or diagnosis of COVID-19 infection under section 564(b)(1) of the Act, 21 U.S.C. 161WRU-0(A)  (1), unless the authorization is terminated or revoked sooner. When diagnostic testing is negative, the possibility of a false negative result should be considered in the context of a patient's recent exposures and the presence of clinical signs and symptoms consistent with COVID-19. An individual without symptoms of COVID-19 and who is not shedding SARS-CoV-2 virus wo uld expect to have a negative (not detected) result in this assay.     Labs: CBC: Recent Labs  Lab 11/22/23 1140 11/23/23 0347 11/24/23 0525 11/25/23 0504 11/26/23 0508  WBC 11.1* 8.1 13.5* 13.1* 10.0  HGB 15.0 13.4 13.4 13.5 12.0  HCT 43.3 40.7 40.2 39.5 36.6  MCV 87.1 90.6 87.4 88.6 90.1  PLT 269 226 218 237 255   Basic Metabolic Panel: Recent Labs  Lab 11/22/23 1140 11/23/23 0347 11/24/23 0525 11/25/23 0504 11/26/23 0508  NA 135 136 132* 136 137  K 4.3 3.6 3.2* 3.2* 3.8  CL 106 106 104 105 109  CO2 21* 21* 18* 19* 18*  GLUCOSE 159* 93 109* 104* 111*  BUN 8 6 <5* <5* 6  CREATININE 0.68 0.55 0.48 0.46 0.54  CALCIUM 9.2 8.3* 8.6* 8.8* 9.3  MG  --  2.0 1.9 1.8 2.0   Liver Function Tests: Recent Labs  Lab 11/22/23 1140 11/23/23 0347 11/24/23 0525 11/25/23 0504 11/26/23 0508  AST 1,406* 369* 77* 40 34  ALT 1,192* 719* 383* 267* 173*  ALKPHOS 145* 119 102 90 80  BILITOT 2.9* 1.2 1.1 1.3* 1.0  PROT 7.8 6.5 6.7 7.3 6.9  ALBUMIN 4.1 3.5 3.5 3.5 3.2*   CBG: No results for input(s): "GLUCAP" in the last 168 hours.  Discharge time spent: greater than 30 minutes.  Signed: Tyrone Nine, MD [facility] 11/28/2023

## 2023-11-28 NOTE — Plan of Care (Signed)

## 2023-12-01 IMAGING — US US PELVIS COMPLETE WITH TRANSVAGINAL
1 series · 14 of 25 positions shown · non-contrast
Comparison: None

CLINICAL DATA: LEFT lower quadrant pain and irregular menses, G1P1,
LMP 01/17/2022

EXAM:
TRANSABDOMINAL AND TRANSVAGINAL ULTRASOUND OF PELVIS
TECHNIQUE: Both transabdominal and transvaginal ultrasound examinations of the
pelvis were performed. Transabdominal technique was performed for
global imaging of the pelvis including uterus, ovaries, adnexal
regions, and pelvic cul-de-sac. It was necessary to proceed with
endovaginal exam following the transabdominal exam to visualize the
endometrium and adnexa.

[Series 1: us pelvic complete with transvaginal · 14 of 109 slices shown]
[im 1/109]
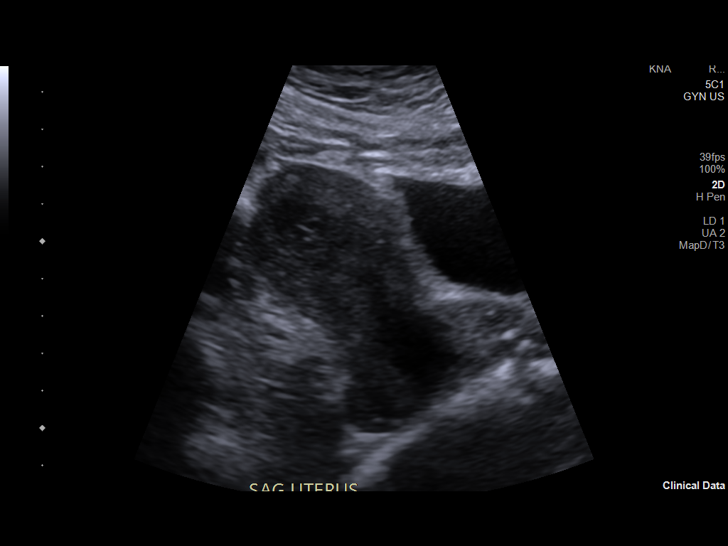
[im 10/109]
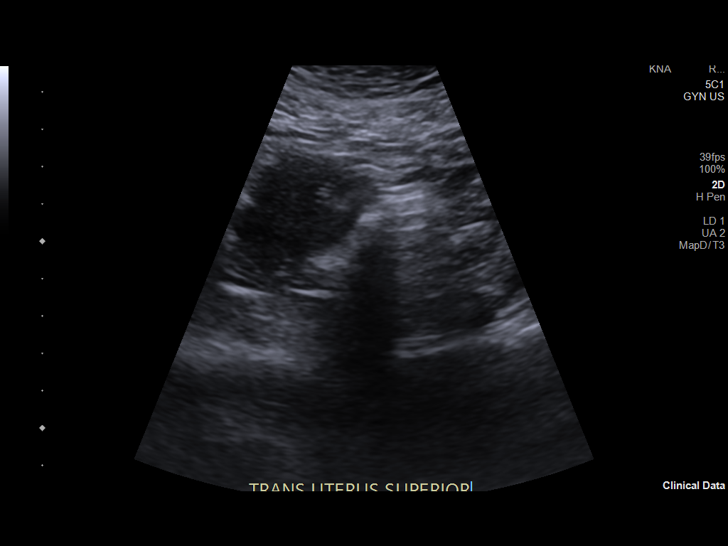
[im 19/109]
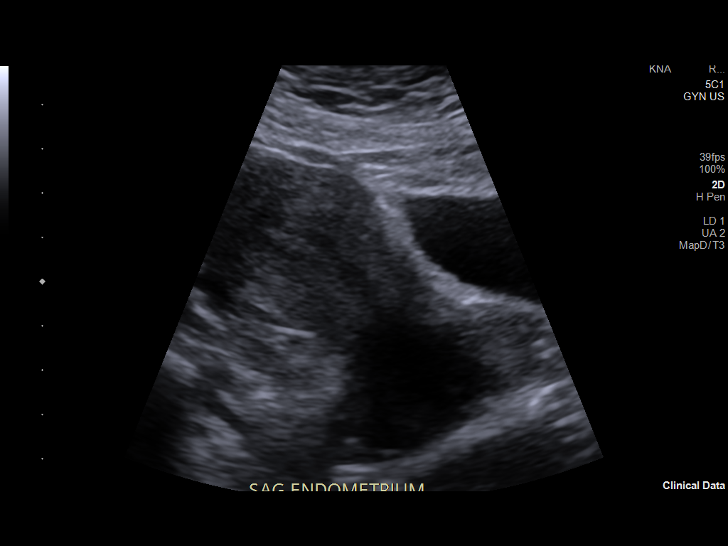
[im 28/109]
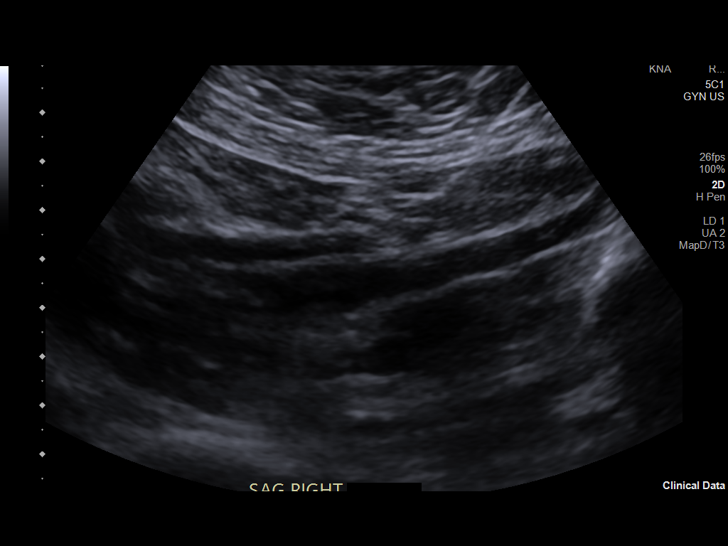
[im 37/109]
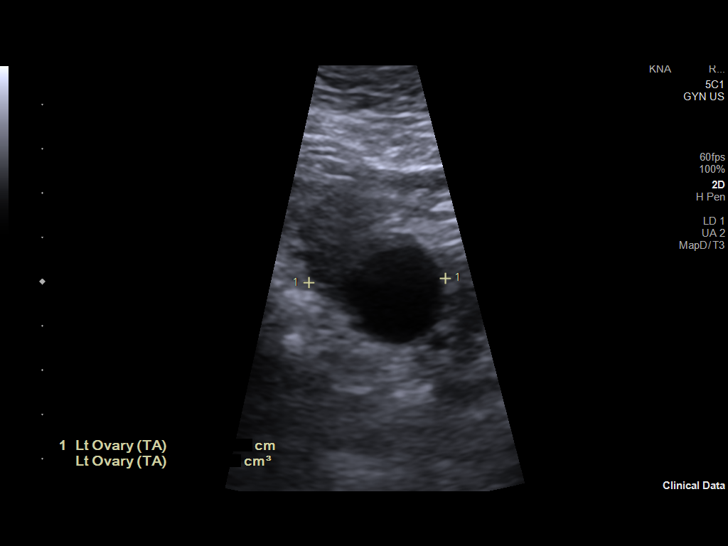
[im 41/109]
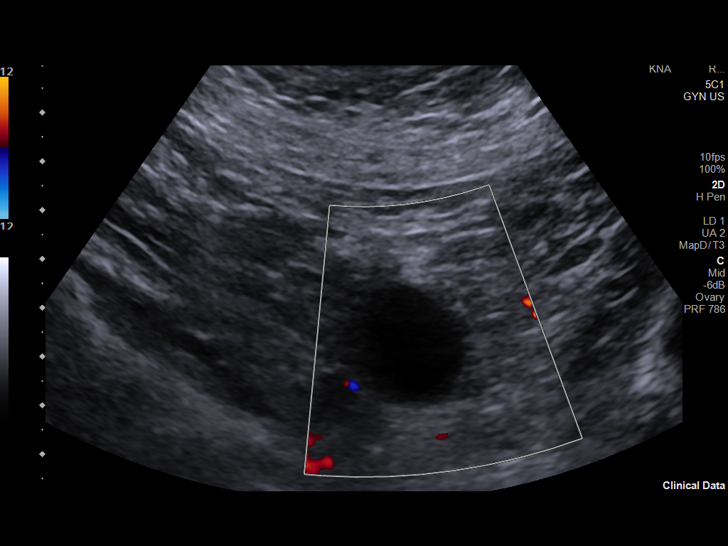
[im 50/109]
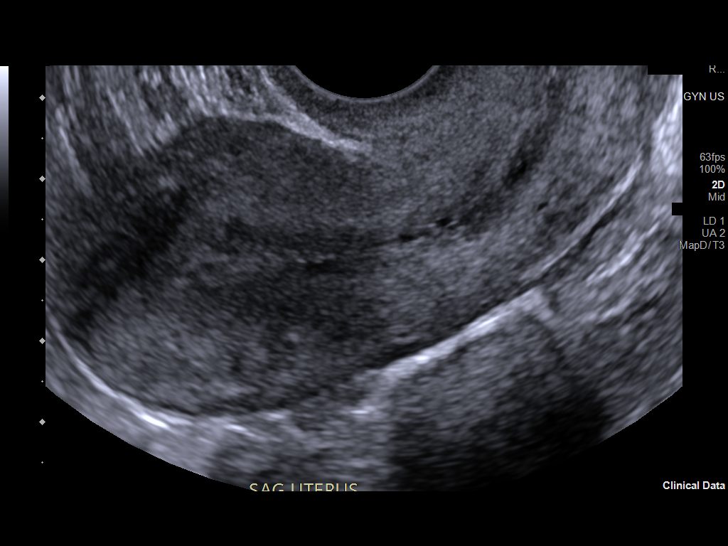
[im 59/109]
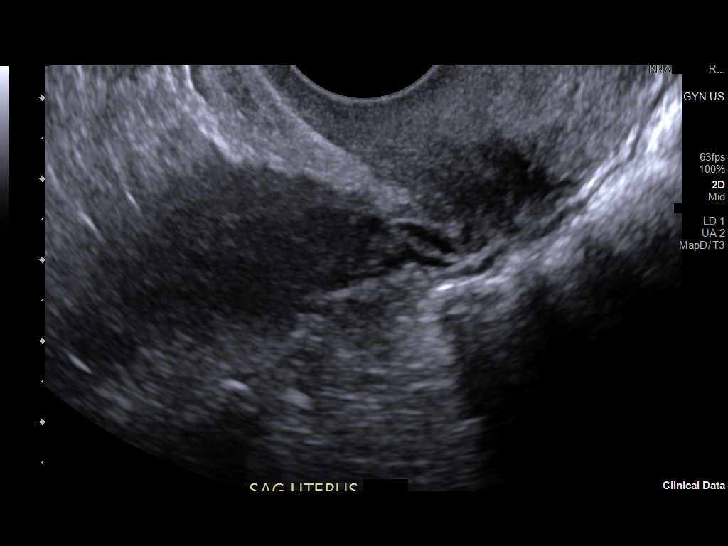
[im 68/109]
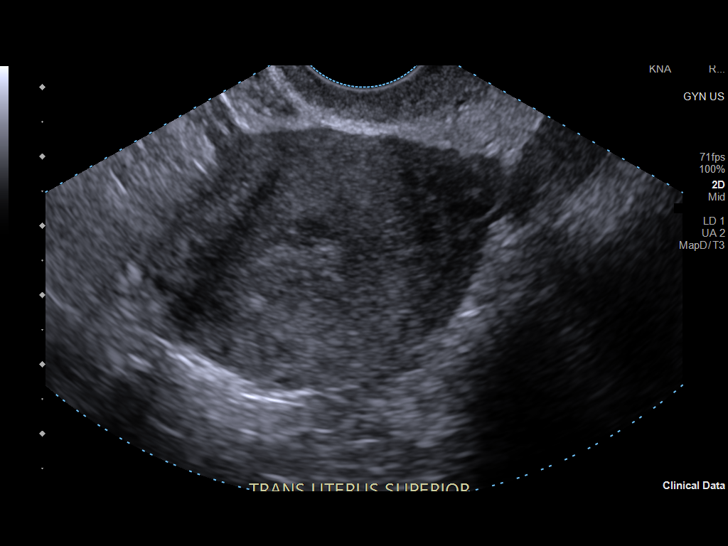
[im 73/109]
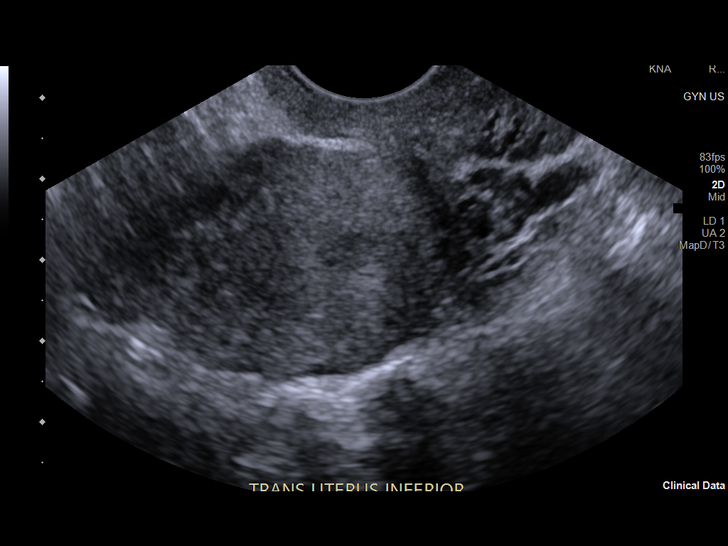
[im 82/109]
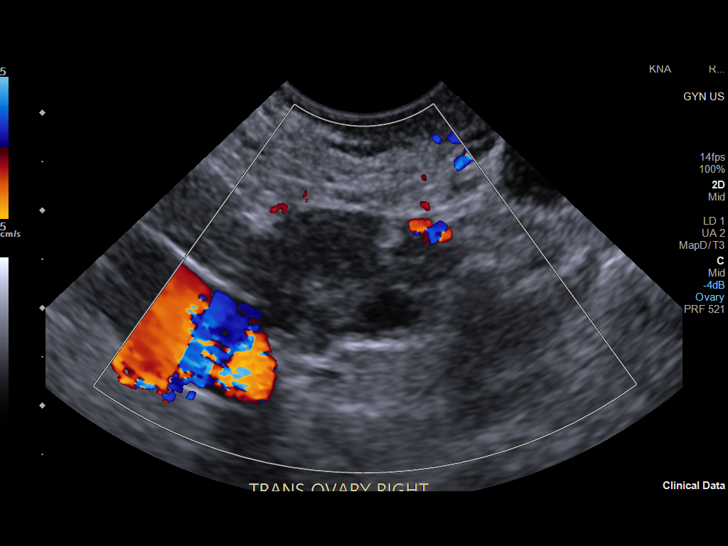
[im 91/109]
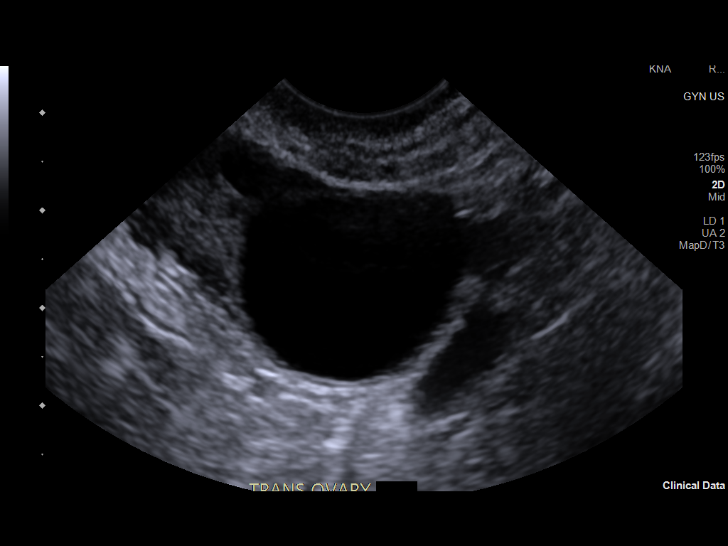
[im 100/109]
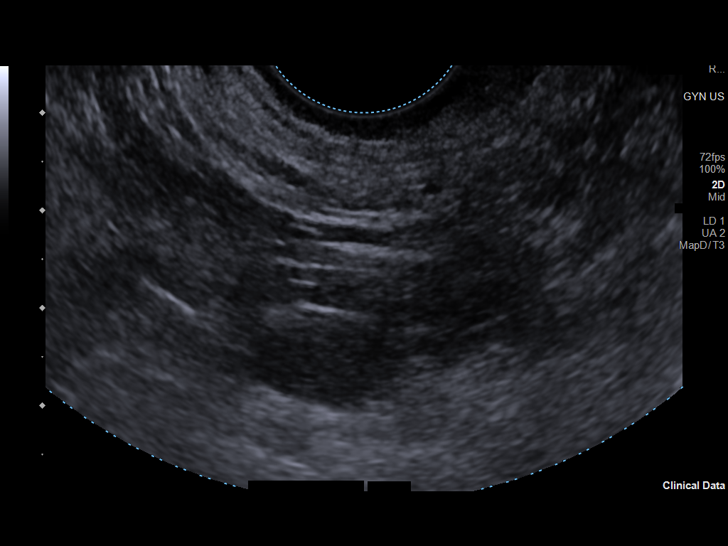
[im 109/109]
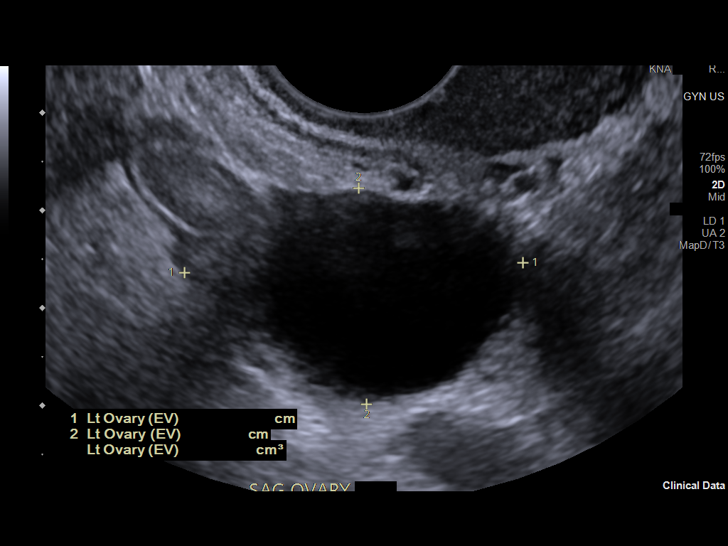

[14 of 25 positions shown; findings below may reference images not displayed]

FINDINGS: Uterus

Measurements: 7.7 x 3.8 x 4.6 cm = volume: 70 mL. Anteverted. Normal
morphology without mass. Essure coils noted.

Endometrium

Thickness: 5 mm.  No endometrial fluid or mass

Right ovary

Measurements: 2.7 x 1.7 x 2.0 cm = volume: 4.7 mL. Normal morphology
without mass

Left ovary

Measurements: 8.5 x 2.2 x 3.7 cm = volume: 14.9 mL. Dominant
physiologic follicle 2.4 cm diameter; no follow-up imaging
recommended.

Other findings

No free pelvic fluid.  No adnexal masses.
IMPRESSION: Normal exam.

## 2023-12-09 ENCOUNTER — Ambulatory Visit (INDEPENDENT_AMBULATORY_CARE_PROVIDER_SITE_OTHER): Payer: 59 | Admitting: Internal Medicine

## 2023-12-09 ENCOUNTER — Encounter: Payer: Self-pay | Admitting: Internal Medicine

## 2023-12-09 VITALS — BP 122/87 | HR 117 | Ht 62.0 in | Wt 168.4 lb

## 2023-12-09 DIAGNOSIS — Z9049 Acquired absence of other specified parts of digestive tract: Secondary | ICD-10-CM

## 2023-12-09 DIAGNOSIS — Z09 Encounter for follow-up examination after completed treatment for conditions other than malignant neoplasm: Secondary | ICD-10-CM | POA: Diagnosis not present

## 2023-12-09 DIAGNOSIS — K5909 Other constipation: Secondary | ICD-10-CM

## 2023-12-09 DIAGNOSIS — K851 Biliary acute pancreatitis without necrosis or infection: Secondary | ICD-10-CM | POA: Diagnosis not present

## 2023-12-09 MED ORDER — LACTULOSE 10 GM/15ML PO SOLN: 10.0000 g | Freq: Two times a day (BID) | ORAL | 1 refills | Status: DC | PRN

## 2023-12-09 NOTE — Assessment & Plan Note (Signed)
Due to gallstone pancreatitis Incision site appears C/D/I Follow up with general surgeon

## 2023-12-09 NOTE — Assessment & Plan Note (Signed)
Hospital chart reviewed, including discharge summary Medications reconciled and reviewed with the patient in detail

## 2023-12-09 NOTE — Progress Notes (Signed)
Established Patient Office Visit  Subjective:  Patient ID: Kristina Delacruz, female    DOB: May 20, 1981  Age: 43 y.o. MRN: [id number]  CC:  Chief Complaint  Patient presents with   Follow-up    Pt here for hospital f/u, reports constipation issues, and some abdominal pain still present.    HPI Kristina PFAHLER is a 43 y.o. female with past medical history of allergic rhinitis, GAD and chronic pain syndrome who presents for follow-up after recent hospitalization from 11/22/23-11/26/23.  She was admitted at [facility] for abdominal pain due to acute pancreatitis, noted on CT abdomen.  She had [place] of abdomen, which showed gallstones.  She had laparoscopic cholecystectomy during the same hospitalization.  She has noticed improvement in abdominal pain, nausea and vomiting.  Denies any fever, chills, melena or hematochezia.  Denies any incision site discharge or bleeding.   She still complains of constipation despite taking Colace.  Of note, she takes Percocet for chronic pain.  Past Medical History:  Diagnosis Date   Aortic valve insufficiency    Arthritis    GERD (gastroesophageal reflux disease)    Mitral valve disorder    Stress fracture of cervical vertebra    Trichimoniasis 08/13/2021   08/13/21 treated with flagyl.    Past Surgical History:  Procedure Laterality Date   DILATATION AND CURETTAGE/HYSTEROSCOPY WITH MINERVA N/A 04/24/2022   Procedure: DILATATION AND CURETTAGE/HYSTEROSCOPY WITH MINERVA;  Surgeon: Lazaro Arms, MD;  Location: [facility];  Service: Gynecology;  Laterality: N/A;   ESSURE TUBAL LIGATION Bilateral    LAPAROSCOPIC BILATERAL SALPINGECTOMY N/A 04/24/2022   Procedure: LAPAROSCOPIC BILATERAL SALPINGECTOMY; removal foreign object;  Surgeon: Lazaro Arms, MD;  Location: [facility];  Service: Gynecology;  Laterality: N/A;   TUBAL LIGATION      Family History  Problem Relation Age of Onset   Thyroid disease Mother    Hypertension Father        AVR   Heart  murmur Father    Hypercholesterolemia Sister    Thyroid disease Sister    Diabetes type I Daughter    Thyroid disease Paternal Aunt    Thyroid disease Paternal Uncle    Hypertension Maternal Grandmother    Colon cancer Paternal Grandfather    Breast cancer Neg Hx    Cervical cancer Neg Hx     Social History   Socioeconomic History   Marital status: Single    Spouse name: Not on file   Number of children: 1   Years of education: Not on file   Highest education level: Not on file  Occupational History   Not on file  Tobacco Use   Smoking status: Former    Types: Cigarettes   Smokeless tobacco: Never  Vaping Use   Vaping status: Never Used  Substance and Sexual Activity   Alcohol use: Not Currently   Drug use: No   Sexual activity: Not Currently    Birth control/protection: Surgical    Comment: tubal & ablation  Other Topics Concern   Not on file  Social History Narrative   Lives home alone   Social Drivers of Health   Financial Resource Strain: Low Risk  (08/08/2021)   Overall Financial Resource Strain (CARDIA)    Difficulty of Paying Living Expenses: Not very hard  Food Insecurity: No Food Insecurity (11/22/2023)   Hunger Vital Sign    Worried About Running Out of Food in the Last Year: Never true    Ran Out of Food  in the Last Year: Never true  Transportation Needs: No Transportation Needs (11/22/2023)   PRAPARE - Administrator, Civil Service (Medical): No    Lack of Transportation (Non-Medical): No  Physical Activity: Sufficiently Active (08/08/2021)   Exercise Vital Sign    Days of Exercise per Week: 6 days    Minutes of Exercise per Session: 30 min  Stress: No Stress Concern Present (08/08/2021)   [employer] of Occupational Health - Occupational Stress Questionnaire    Feeling of Stress : Only a little  Social Connections: Moderately Integrated (11/22/2023)   Social Connection and Isolation Panel [NHANES]    Frequency of Communication with  Friends and Family: More than three times a week    Frequency of Social Gatherings with Friends and Family: Once a week    Attends Religious Services: More than 4 times per year    Active Member of [employer] or Organizations: No    Attends Banker Meetings: Patient declined    Marital Status: Living with partner  Intimate Partner Violence: Unknown (11/25/2023)   Humiliation, Afraid, Rape, and Kick questionnaire    Fear of Current or Ex-Partner: No    Emotionally Abused: No    Physically Abused: No    Sexually Abused: Not on file    Outpatient Medications Prior to Visit  Medication Sig Dispense Refill   Bacillus Coagulans-Inulin (PROBIOTIC-PREBIOTIC PO) Take 1 capsule by mouth daily.     calcium carbonate (TUMS - DOSED IN MG ELEMENTAL CALCIUM) 500 MG chewable tablet Chew 1 tablet by mouth daily as needed for indigestion or heartburn.     Cetirizine HCl (ZYRTEC PO) Take 1 tablet by mouth daily.     cyclobenzaprine (FLEXERIL) 5 MG tablet Take 5 mg by mouth daily.     Drospirenone (SLYND) 4 MG TABS Take 1 tablet (4 mg total) by mouth daily. 28 tablet 12   gabapentin (NEURONTIN) 100 MG capsule TAKE 2 CAPSULES BY MOUTH AT NIGHT 60 capsule 4   MAGNESIUM PO Take 1 tablet by mouth daily as needed (acid reflux).     montelukast (SINGULAIR) 10 MG tablet TAKE 1 TABLET BY MOUTH EVERYDAY AT BEDTIME 30 tablet 3   oxyCODONE-acetaminophen (PERCOCET/ROXICET) 5-325 MG tablet Take 1 tablet by mouth daily as needed for moderate pain (pain score 4-6).     pantoprazole (PROTONIX) 40 MG tablet Take 1 tablet (40 mg total) by mouth 2 (two) times daily. 60 tablet 3   RESTASIS 0.05 % ophthalmic emulsion Place 1 drop into both eyes 2 (two) times daily.     traZODone (DESYREL) 150 MG tablet TAKE 1 TABLET BY MOUTH EVERYDAY AT BEDTIME 90 tablet 3   venlafaxine XR (EFFEXOR-XR) 75 MG 24 hr capsule TAKE 1 CAPSULE BY MOUTH DAILY WITH BREAKFAST. 90 capsule 2   No facility-administered medications prior to visit.     Allergies  Allergen Reactions   Bee Venom Swelling   Fentanyl Nausea And Vomiting    Prefers morphine if need be   Onion Rash   Tape Rash    ROS Review of Systems  Constitutional:  Negative for chills and fever.  HENT:  Positive for congestion. Negative for sore throat.   Eyes:  Negative for pain and discharge.  Respiratory:  Negative for cough and shortness of breath.   Cardiovascular:  Negative for chest pain and palpitations.  Gastrointestinal:  Positive for constipation. Negative for abdominal pain, diarrhea, nausea and vomiting.  Endocrine: Negative for polydipsia and polyuria.  Genitourinary:  Negative for dysuria and hematuria.  Musculoskeletal:  Negative for neck pain and neck stiffness.  Skin:  Negative for rash.  Neurological:  Negative for dizziness and weakness.  Psychiatric/Behavioral:  Negative for agitation and behavioral problems. The patient is nervous/anxious.       Objective:    Physical Exam Vitals reviewed.  Constitutional:      General: She is not in acute distress.    Appearance: She is obese. She is not diaphoretic.  HENT:     Head: Normocephalic and atraumatic.     Nose: Nose normal. No congestion.     Mouth/Throat:     Mouth: Mucous membranes are moist.     Pharynx: No posterior oropharyngeal erythema.  Eyes:     General: No scleral icterus.    Extraocular Movements: Extraocular movements intact.  Cardiovascular:     Rate and Rhythm: Normal rate and regular rhythm.     Heart sounds: Normal heart sounds. No murmur heard. Pulmonary:     Breath sounds: Normal breath sounds. No wheezing or rales.  Abdominal:     Palpations: Abdomen is soft.     Tenderness: There is no abdominal tenderness.     Comments: Incision site - C/D/I  Musculoskeletal:     Cervical back: Neck supple. No tenderness.     Right lower leg: No edema.     Left lower leg: No edema.  Skin:    General: Skin is warm.     Findings: No rash.  Neurological:     General:  No focal deficit present.     Mental Status: She is alert and oriented to person, place, and time.  Psychiatric:        Mood and Affect: Mood normal.        Behavior: Behavior normal.     BP 122/87   Pulse (!) 117   Ht 5\' 2"  (1.575 m)   Wt 168 lb 6.4 oz (76.4 kg)   SpO2 97%   BMI 30.80 kg/m  Wt Readings from Last 3 Encounters:  12/09/23 168 lb 6.4 oz (76.4 kg)  11/22/23 170 lb 12.8 oz (77.5 kg)  11/19/23 165 lb (74.8 kg)    Lab Results  Component Value Date   TSH 3.270 08/26/2023   Lab Results  Component Value Date   WBC 10.0 11/26/2023   HGB 12.0 11/26/2023   HCT 36.6 11/26/2023   MCV 90.1 11/26/2023   PLT 255 11/26/2023   Lab Results  Component Value Date   NA 137 11/26/2023   K 3.8 11/26/2023   CO2 18 (L) 11/26/2023   GLUCOSE 111 (H) 11/26/2023   BUN 6 11/26/2023   CREATININE 0.54 11/26/2023   BILITOT 1.0 11/26/2023   ALKPHOS 80 11/26/2023   AST 34 11/26/2023   ALT 173 (H) 11/26/2023   PROT 6.9 11/26/2023   ALBUMIN 3.2 (L) 11/26/2023   CALCIUM 9.3 11/26/2023   ANIONGAP 10 11/26/2023   EGFR 78 08/26/2023   Lab Results  Component Value Date   CHOL 207 (H) 11/22/2023   Lab Results  Component Value Date   HDL 61 11/22/2023   Lab Results  Component Value Date   LDLCALC 131 (H) 11/22/2023   Lab Results  Component Value Date   TRIG 61 11/23/2023   Lab Results  Component Value Date   CHOLHDL 3.4 11/22/2023   Lab Results  Component Value Date   HGBA1C 5.2 08/26/2023      Assessment & Plan:   Problem List Items  Addressed This Visit       Digestive   Chronic constipation   Chronic constipation likely worse due to chronic opioids Has tried Colace without much relief, advised to continue Colace twice daily for now Has also tried MiraLAX Added lactulose as needed for mild to moderate constipation Referred to GI      Relevant Medications   lactulose (CHRONULAC) 10 GM/15ML solution   Other Relevant Orders   Ambulatory referral to  Gastroenterology   Acute pancreatitis - Primary   Likely due to gallstones S/p laparoscopic cholecystectomy Had elevated liver enzymes during her hospitalization, recheck CMP Maintain adequate hydration         Other   S/P laparoscopic cholecystectomy   Due to gallstone pancreatitis Incision site appears C/D/I Follow up with general surgeon      Relevant Orders   CMP14+EGFR   Hospital discharge follow-up   Hospital chart reviewed, including discharge summary Medications reconciled and reviewed with the patient in detail       Meds ordered this encounter  Medications   lactulose (CHRONULAC) 10 GM/15ML solution    Sig: Take 15 mLs (10 g total) by mouth 2 (two) times daily as needed for mild constipation or moderate constipation.    Dispense:  473 mL    Refill:  1    Follow-up: Return if symptoms worsen or fail to improve.    Anabel Halon, MD

## 2023-12-09 NOTE — Assessment & Plan Note (Signed)
Chronic constipation likely worse due to chronic opioids Has tried Colace without much relief, advised to continue Colace twice daily for now Has also tried MiraLAX Added lactulose as needed for mild to moderate constipation Referred to GI

## 2023-12-09 NOTE — Assessment & Plan Note (Signed)
Likely due to gallstones S/p laparoscopic cholecystectomy Had elevated liver enzymes during her hospitalization, recheck CMP Maintain adequate hydration

## 2023-12-09 NOTE — Patient Instructions (Signed)
Please start taking Lactulose as prescribed for resistant constipation.  Please continue taking Colace up to twice daily.  Please continue to take medications as prescribed.  Please continue to follow low carb diet and ambulate as tolerated.

## 2023-12-09 NOTE — Addendum Note (Signed)
Addended byTrena Platt on: 12/09/2023 05:07 PM   Modules accepted: Level of Service

## 2023-12-10 ENCOUNTER — Encounter: Payer: Self-pay | Admitting: Internal Medicine

## 2023-12-10 ENCOUNTER — Ambulatory Visit (INDEPENDENT_AMBULATORY_CARE_PROVIDER_SITE_OTHER): Payer: 59 | Admitting: Surgery

## 2023-12-10 ENCOUNTER — Encounter (INDEPENDENT_AMBULATORY_CARE_PROVIDER_SITE_OTHER): Payer: Self-pay | Admitting: *Deleted

## 2023-12-10 DIAGNOSIS — Z09 Encounter for follow-up examination after completed treatment for conditions other than malignant neoplasm: Secondary | ICD-10-CM

## 2023-12-10 LAB — CMP14+EGFR
ALT: 22 [IU]/L (ref 0–32)
AST: 14 [IU]/L (ref 0–40)
Albumin: 4.2 g/dL (ref 3.9–4.9)
Alkaline Phosphatase: 79 [IU]/L (ref 44–121)
BUN/Creatinine Ratio: 12 (ref 9–23)
BUN: 9 mg/dL (ref 6–24)
Bilirubin Total: 0.3 mg/dL (ref 0.0–1.2)
CO2: 19 mmol/L — ABNORMAL LOW (ref 20–29)
Calcium: 9.6 mg/dL (ref 8.7–10.2)
Chloride: 108 mmol/L — ABNORMAL HIGH (ref 96–106)
Creatinine, Ser: 0.78 mg/dL (ref 0.57–1.00)
Globulin, Total: 3.1 g/dL (ref 1.5–4.5)
Glucose: 73 mg/dL (ref 70–99)
Potassium: 4.3 mmol/L (ref 3.5–5.2)
Sodium: 142 mmol/L (ref 134–144)
Total Protein: 7.3 g/dL (ref 6.0–8.5)
eGFR: 97 mL/min/{1.73_m2} (ref >59)

## 2023-12-10 NOTE — Progress Notes (Signed)
[facility]  I am calling the patient for post operative evaluation. This is not a billable encounter as it is under the global charges for the surgery.  The patient had a robotic assisted laparoscopic cholecystectomy on 1/14. The patient did not answer the phone and voicemail was left.  Pathology: A. GALLBLADDER, CHOLECYSTECTOMY:       Chronic cholecystitis.       Cholelithiasis.   Will see the patient PRN.   Theophilus Kinds, DO [facility] [address] [place], [place] [address] [phone] (office)

## 2023-12-12 ENCOUNTER — Ambulatory Visit: Payer: 59 | Attending: Internal Medicine | Admitting: Internal Medicine

## 2023-12-12 ENCOUNTER — Encounter: Payer: Self-pay | Admitting: Internal Medicine

## 2023-12-12 VITALS — BP 129/90 | HR 111 | Ht 62.0 in | Wt 171.0 lb

## 2023-12-12 DIAGNOSIS — R000 Tachycardia, unspecified: Secondary | ICD-10-CM

## 2023-12-12 MED ORDER — METOPROLOL TARTRATE 25 MG PO TABS: 25.0000 mg | ORAL_TABLET | Freq: Two times a day (BID) | ORAL | 3 refills | Status: DC

## 2023-12-12 NOTE — Patient Instructions (Addendum)
Medication Instructions:   START Metoprolol 25 mg twice a day  Labwork: None today  Testing/Procedures: None today  Follow-Up: 1 year   Any Other Special Instructions Will Be Listed Below (If Applicable).   *Call us back if you find that you have a family history of aortic aneurysm*   If you need a refill on your cardiac medications before your next appointment, please call your pharmacy.

## 2023-12-12 NOTE — Progress Notes (Unsigned)
Cardiology Office Note  Date: 12/12/2023   ID: Kristina, Delacruz 1980/12/26, MRN [id number]  PCP:  Gilmore Laroche, FNP  Cardiologist:  Marjo Bicker, MD Electrophysiologist:  None    History of Present Illness: Kristina Delacruz is a 43 y.o. female known to have mild to moderate aortic valve regurgitation, GERD is here for follow-up visit.  Patient was initially referred to cardiology clinic in 12/2022 for evaluation of syncope.  She reported having few syncopal spells every month since 2015. She has a prodrome of chest tightness/pressure, dizziness, palpitations followed by blacking out completely. She loses consciousness for a few seconds to a minute. No confusion after she regains consciousness. She eats healthy and drinks adequate water every day. Orthostatic vitals  were negative for orthostatic hypotension and POTS. She does have a family history of bicuspid aortic valve (per patient) on her father, had aortic valve replacement when he was 43 years old.  She says she has a genetic condition running in the family and does not exactly know the name of it. Former smoker, denies alcohol and illicit drug use.  In 01/2023, echocardiogram showed normal LVEF, mild to moderate AI and mild MR; exercise tolerance test showed no evidence of ischemia and a cardiac event monitor was unremarkable and did not show any evidence of pauses or arrhythmias.  She is here for follow-up visit.  She is extremely stressed out and nervous during my interview. She does have symptoms of decreased exercise capacity and SOB in the last 1 year. She does have chest heaviness especially at nighttime when she lays on her back but this could be from her GERD medication as well. She is currently on omeprazole and pantoprazole as needed. Whenever she misses her GERD medications, she starts to experience chest heaviness.  Past Medical History:  Diagnosis Date   Aortic valve insufficiency    Arthritis    GERD  (gastroesophageal reflux disease)    Mitral valve disorder    Stress fracture of cervical vertebra    Trichimoniasis 08/13/2021   08/13/21 treated with flagyl.    Past Surgical History:  Procedure Laterality Date   DILATATION AND CURETTAGE/HYSTEROSCOPY WITH MINERVA N/A 04/24/2022   Procedure: DILATATION AND CURETTAGE/HYSTEROSCOPY WITH MINERVA;  Surgeon: Lazaro Arms, MD;  Location: [facility];  Service: Gynecology;  Laterality: N/A;   ESSURE TUBAL LIGATION Bilateral    LAPAROSCOPIC BILATERAL SALPINGECTOMY N/A 04/24/2022   Procedure: LAPAROSCOPIC BILATERAL SALPINGECTOMY; removal foreign object;  Surgeon: Lazaro Arms, MD;  Location: [facility];  Service: Gynecology;  Laterality: N/A;   TUBAL LIGATION      Current Outpatient Medications  Medication Sig Dispense Refill   Bacillus Coagulans-Inulin (PROBIOTIC-PREBIOTIC PO) Take 1 capsule by mouth daily.     calcium carbonate (TUMS - DOSED IN MG ELEMENTAL CALCIUM) 500 MG chewable tablet Chew 1 tablet by mouth daily as needed for indigestion or heartburn.     Cetirizine HCl (ZYRTEC PO) Take 1 tablet by mouth daily.     cyclobenzaprine (FLEXERIL) 5 MG tablet Take 5 mg by mouth daily.     Drospirenone (SLYND) 4 MG TABS Take 1 tablet (4 mg total) by mouth daily. 28 tablet 12   gabapentin (NEURONTIN) 100 MG capsule TAKE 2 CAPSULES BY MOUTH AT NIGHT 60 capsule 4   lactulose (CHRONULAC) 10 GM/15ML solution Take 15 mLs (10 g total) by mouth 2 (two) times daily as needed for mild constipation or moderate constipation. 473 mL 1   MAGNESIUM PO Take 1  tablet by mouth daily as needed (acid reflux).     montelukast (SINGULAIR) 10 MG tablet TAKE 1 TABLET BY MOUTH EVERYDAY AT BEDTIME 30 tablet 3   oxyCODONE-acetaminophen (PERCOCET/ROXICET) 5-325 MG tablet Take 1 tablet by mouth daily as needed for moderate pain (pain score 4-6).     pantoprazole (PROTONIX) 40 MG tablet Take 1 tablet (40 mg total) by mouth 2 (two) times daily. 60 tablet 3   RESTASIS 0.05 %  ophthalmic emulsion Place 1 drop into both eyes 2 (two) times daily.     traZODone (DESYREL) 150 MG tablet TAKE 1 TABLET BY MOUTH EVERYDAY AT BEDTIME 90 tablet 3   venlafaxine XR (EFFEXOR-XR) 75 MG 24 hr capsule TAKE 1 CAPSULE BY MOUTH DAILY WITH BREAKFAST. 90 capsule 2   No current facility-administered medications for this visit.   Allergies:  Bee venom, Fentanyl, Onion, and Tape   Social History: The patient  reports that she has quit smoking. Her smoking use included cigarettes. She has never used smokeless tobacco. She reports that she does not currently use alcohol. She reports that she does not use drugs.   Family History: The patient's family history includes Colon cancer in her paternal grandfather; Diabetes type I in her daughter; Heart murmur in her father; Hypercholesterolemia in her sister; Hypertension in her father and maternal grandmother; Thyroid disease in her mother, paternal aunt, paternal uncle, and sister.   ROS:  Please see the history of present illness. Otherwise, complete review of systems is positive for none.  All other systems are reviewed and negative.   Physical Exam: VS:  There were no vitals taken for this visit., BMI There is no height or weight on file to calculate BMI.  Wt Readings from Last 3 Encounters:  12/09/23 168 lb 6.4 oz (76.4 kg)  11/22/23 170 lb 12.8 oz (77.5 kg)  11/19/23 165 lb (74.8 kg)    General: Patient appears comfortable at rest. HEENT: Conjunctiva and lids normal, oropharynx clear with moist mucosa. Neck: Supple, no elevated JVP or carotid bruits, no thyromegaly. Lungs: Clear to auscultation, nonlabored breathing at rest. Cardiac: Regular rate and rhythm, no S3 or significant systolic murmur, no pericardial rub. Abdomen: Soft, nontender, no hepatomegaly, bowel sounds present, no guarding or rebound. Extremities: No pitting edema, distal pulses 2+. Skin: Warm and dry. Musculoskeletal: No kyphosis. Neuropsychiatric: Alert and  oriented x3, affect grossly appropriate.  ECG:  An ECG dated 12/20/2022 was personally reviewed today and demonstrated:  Normal sinus rhythm  Recent Labwork: 08/26/2023: TSH 3.270 11/26/2023: Hemoglobin 12.0; Magnesium 2.0; Platelets 255 12/09/2023: ALT 22; AST 14; BUN 9; Creatinine, Ser 0.78; Potassium 4.3; Sodium 142     Component Value Date/Time   CHOL 207 (H) 11/22/2023 1140   CHOL 178 08/26/2023 0843   TRIG 61 11/23/2023 0347   HDL 61 11/22/2023 1140   HDL 48 08/26/2023 0843   CHOLHDL 3.4 11/22/2023 1140   VLDL 15 11/22/2023 1140   LDLCALC 131 (H) 11/22/2023 1140   LDLCALC 115 (H) 08/26/2023 0843    Other Studies Reviewed Today:   Assessment and Plan: Patient is a 43 year old F known to have mild to moderate aortic valve regurgitation, GERD is here for follow-up visit.  # Non-cardiac syncope -Patient has been having 2-3 syncopal episodes every month since 2015. Orthostatic vitals were negative for orthostatic hypotension and POTS. Cardiac event monitor from 01/2023 showed no evidence of pauses/AV block/arrhythmias.  No evidence of preexcitation.  Echocardiogram ruled out any structural heart disease.  # Mild  to moderate aortic valve regurgitation in 2024 -Patient has a strong family history of aortic valve replacement in her father, grandmother and her daughter also sees a cardiologist.  Echocardiogram showed tricuspid aortic valve with mild to moderate aortic valve regurgitation. Aorta is within normal limits.  She does have decreased exercise tolerance and SOB for the last 1 year.  Will repeat 2D echocardiogram in 10/2023 and follow-up in 11/2023.    Medication Adjustments/Labs and Tests Ordered: Current medicines are reviewed at length with the patient today.  Concerns regarding medicines are outlined above.   Tests Ordered: No orders of the defined types were placed in this encounter.    Medication Changes: No orders of the defined types were placed in this  encounter.   Disposition:  Follow up  1 year  Signed, Ardyce Heyer Verne Spurr, MD, 12/12/2023 10:57 AM    [facility] [address], [place], [place] [address]

## 2023-12-13 DIAGNOSIS — Z419 Encounter for procedure for purposes other than remedying health state, unspecified: Secondary | ICD-10-CM | POA: Diagnosis not present

## 2023-12-14 DIAGNOSIS — R000 Tachycardia, unspecified: Secondary | ICD-10-CM | POA: Insufficient documentation

## 2023-12-16 ENCOUNTER — Ambulatory Visit (INDEPENDENT_AMBULATORY_CARE_PROVIDER_SITE_OTHER): Payer: 59 | Admitting: Adult Health

## 2023-12-16 ENCOUNTER — Encounter: Payer: Self-pay | Admitting: Internal Medicine

## 2023-12-16 ENCOUNTER — Encounter: Payer: Self-pay | Admitting: Adult Health

## 2023-12-16 VITALS — BP 114/79 | HR 77 | Ht 62.0 in | Wt 171.0 lb

## 2023-12-16 DIAGNOSIS — R232 Flushing: Secondary | ICD-10-CM

## 2023-12-16 DIAGNOSIS — Z1331 Encounter for screening for depression: Secondary | ICD-10-CM | POA: Diagnosis not present

## 2023-12-16 DIAGNOSIS — R4589 Other symptoms and signs involving emotional state: Secondary | ICD-10-CM | POA: Diagnosis not present

## 2023-12-16 MED ORDER — SLYND 4 MG PO TABS: 1.0000 | ORAL_TABLET | Freq: Every day | ORAL | 12 refills | Status: AC

## 2023-12-16 NOTE — Progress Notes (Signed)
 Subjective:     Patient ID: Kristina Delacruz, female   DOB: [birth year], 43 y.o.   MRN: [id number]  HPI Kristina Delacruz is a 43 year old white female, single, G1P1001, back in follow up on increasing Effexor  to 75 mg 1 daily and moods are OK, she had gallbladder removed 11/25/23. She is having hot flashes esp at night seems worse, and not sleeping well. No bleeding with slynd .      Component Value Date/Time   DIAGPAP  08/08/2021 1623    - Negative for intraepithelial lesion or malignancy (NILM)   HPVHIGH Negative 08/08/2021 1623   ADEQPAP  08/08/2021 1623    Satisfactory for evaluation; transformation zone component PRESENT.    PCP is Gloria Zarwolo NP  Review of Systems Moods OK +hot flashes, esp at night  Not sleeping well Has knots on stomach, where received heparin  after sugery Reviewed past medical,surgical, social and family history. Reviewed medications and allergies.     Objective:   Physical Exam BP 114/79 (BP Location: Left Arm, Patient Position: Sitting, Cuff Size: Normal)   Pulse 77   Ht 5' 2 (1.575 m)   Wt 171 lb (77.6 kg)   BMI 31.28 kg/m     Skin warm and dry.  Lungs: clear to ausculation bilaterally. Cardiovascular: regular rate and rhythm. Abdomen does still have some bruising and knots under skin, they should resolve.     12/16/2023    3:33 PM 12/09/2023    3:04 PM 10/21/2023    4:19 PM  Depression screen PHQ 2/9  Decreased Interest 1 0 1  Down, Depressed, Hopeless 1 0 1  PHQ - 2 Score 2 0 2  Altered sleeping 3 0 1  Tired, decreased energy 1 0 1  Change in appetite 0 0 1  Feeling bad or failure about yourself  0 0 0  Trouble concentrating 0 0 0  Moving slowly or fidgety/restless 0 0 0  Suicidal thoughts 0 0 0  PHQ-9 Score 6 0 5  Difficult doing work/chores  Not difficult at all Somewhat difficult       12/16/2023    3:35 PM 12/09/2023    3:04 PM 10/21/2023    4:22 PM 09/25/2023    9:22 AM  GAD 7 : Generalized Anxiety Score  Nervous, Anxious, on Edge 1 0 0  1  Control/stop worrying 1 0 0 1  Worry too much - different things 0 0 1 0  Trouble relaxing 2 0 0 0  Restless 1 0 0 0  Easily annoyed or irritable 1 0 1 1  Afraid - awful might happen 0 0 0 0  Total GAD 7 Score 6 0 2 3  Anxiety Difficulty  Not difficult at all  Not difficult at all  Co exam with Joetta Molt NP student    Assessment:     1. Moody (Primary) Moods ok with Effexor  75 mg 1 daily, has refills   2. Hot flashes Still having hot flashes, seem worse at night Continue slynd , the Effexor  may help and the neurotin could help too Meds ordered this encounter  Medications   Drospirenone  (SLYND ) 4 MG TABS    Sig: Take 1 tablet (4 mg total) by mouth daily.    Dispense:  28 tablet    Refill:  12    Supervising Provider:   JAYNE VONN DEL [[id number]]        Plan:     Follow up in 8 weeks for ROS

## 2023-12-18 ENCOUNTER — Other Ambulatory Visit: Payer: Self-pay

## 2023-12-18 DIAGNOSIS — Z8249 Family history of ischemic heart disease and other diseases of the circulatory system: Secondary | ICD-10-CM

## 2023-12-18 NOTE — Telephone Encounter (Signed)
 Appt made and Pt called and informed.

## 2023-12-21 ENCOUNTER — Other Ambulatory Visit: Payer: Self-pay | Admitting: Family Medicine

## 2023-12-21 DIAGNOSIS — K219 Gastro-esophageal reflux disease without esophagitis: Secondary | ICD-10-CM

## 2023-12-21 DIAGNOSIS — J3089 Other allergic rhinitis: Secondary | ICD-10-CM

## 2023-12-23 ENCOUNTER — Ambulatory Visit: Payer: 59 | Admitting: Family Medicine

## 2023-12-25 ENCOUNTER — Encounter: Payer: Self-pay | Admitting: Family Medicine

## 2023-12-25 ENCOUNTER — Ambulatory Visit (INDEPENDENT_AMBULATORY_CARE_PROVIDER_SITE_OTHER): Payer: 59 | Admitting: Family Medicine

## 2023-12-25 VITALS — BP 112/78 | HR 70 | Resp 16 | Ht 62.0 in | Wt 172.0 lb

## 2023-12-25 DIAGNOSIS — R1032 Left lower quadrant pain: Secondary | ICD-10-CM | POA: Diagnosis not present

## 2023-12-25 DIAGNOSIS — N951 Menopausal and female climacteric states: Secondary | ICD-10-CM

## 2023-12-25 DIAGNOSIS — G4700 Insomnia, unspecified: Secondary | ICD-10-CM | POA: Diagnosis not present

## 2023-12-25 NOTE — Patient Instructions (Addendum)
I appreciate the opportunity to provide care to you today!    Follow up:  4 months  Labs:next visit  Insomnia - Likely Due to Hot Flashes Start taking Gabapentin 100 mg three times daily to help manage symptoms. Discuss with your gynecologist the use of Estroven Hot Flash supplement (over-the-counter) as a potential option for symptom relief. Monitor for any side effects and follow up if symptoms persist or worsen.   Non-Pharmacological Management for Sleep Hygiene:  Establish a Consistent Bedtime Routine: -Develop and adhere to a regular sleep and wake schedule. -Avoid using electronic devices, including computers and smartphones, at least one hour before bedtime. -If unable to fall asleep within 15 minutes, refrain from staying in bed and engage in a relaxing activity until you feel sleepy. Reduce Daily Stress: -Engage in stress-reducing activities before bedtime to help relax your mind and body. Avoid intense physical exercise and stimulant use, such as caffeine, late in the day. Optimize Sleep Environment: -Use the bed and bedroom exclusively for sleep and intimate activities. -Consider removing electronic devices from the sleeping area and limit screen time prior to bedtime. Incorporate Relaxation Techniques: -Practice abdominal breathing and meditation to promote relaxation. Utilize progressive muscle relaxation and visualization techniques to aid in achieving restful sleep.   Please continue to a heart-healthy diet and increase your physical activities. Try to exercise for at least five days a week.    It was a pleasure to see you and I look forward to continuing to work together on your health and well-being. Please do not hesitate to call the office if you need care or have questions about your care.  In case of emergency, please visit the Emergency Department for urgent care, or contact our clinic at [phone] to schedule an appointment. We're here to help  you!   Have a wonderful day and week. With Gratitude, Gilmore Laroche MSN, FNP-BC'

## 2023-12-25 NOTE — Progress Notes (Unsigned)
Established Patient Office Visit  Subjective:  Patient ID: Kristina Delacruz, female    DOB: [birth year]  Age: 43 y.o. MRN: [id number]  CC:  Chief Complaint  Patient presents with   Hip Pain    Wants to discuss increasing the gabapentin for her leg pain    Insomnia    Has been having trouble falling and staying asleep and wakes up with hot flashes. Sometimes she has to change in the middle of the night     HPI Kristina Delacruz is a 43 y.o. female with past medical history of vasomotor symptoms of menopause, insomnia, and hip pain presents for f/u of  chronic medical conditions. For the details of today's visit, please refer to the assessment and plan.     Insomnia: The patient complains of difficulty falling asleep and maintaining sleep. She also reports severe hot flashes, which frequently wake her during the night. She is under the care of her gynecologist and has discussed her menopausal symptoms. She has been encouraged to continue taking Gabapentin 100 mg BID, Effexor 75 mg daily, and Slynd 4 mg daily for symptom management. Additionally, she is currently taking Trazodone 100 mg at bedtime for sleep.  Hip Pain: The patient reports minimal relief with Gabapentin 100 mg twice daily. She recently had the medication refilled and is requesting an increase in dosage for better pain management.  Past Medical History:  Diagnosis Date   Aortic valve insufficiency    Arthritis    GERD (gastroesophageal reflux disease)    Mitral valve disorder    Stress fracture of cervical vertebra    Trichimoniasis 08/13/2021   08/13/21 treated with flagyl.    Past Surgical History:  Procedure Laterality Date   DILATATION AND CURETTAGE/HYSTEROSCOPY WITH MINERVA N/A 04/24/2022   Procedure: DILATATION AND CURETTAGE/HYSTEROSCOPY WITH MINERVA;  Surgeon: Lazaro Arms, MD;  Location: [facility];  Service: Gynecology;  Laterality: N/A;   ESSURE TUBAL LIGATION Bilateral    GALLBLADDER SURGERY  11/25/2023    LAPAROSCOPIC BILATERAL SALPINGECTOMY N/A 04/24/2022   Procedure: LAPAROSCOPIC BILATERAL SALPINGECTOMY; removal foreign object;  Surgeon: Lazaro Arms, MD;  Location: [facility];  Service: Gynecology;  Laterality: N/A;   TUBAL LIGATION      Family History  Problem Relation Age of Onset   Thyroid disease Mother    Hypertension Father        AVR   Heart murmur Father    Hypercholesterolemia Sister    Thyroid disease Sister    Diabetes type I Daughter    Thyroid disease Paternal Aunt    Thyroid disease Paternal Uncle    Hypertension Maternal Grandmother    Colon cancer Paternal Grandfather    Breast cancer Neg Hx    Cervical cancer Neg Hx     Social History   Socioeconomic History   Marital status: Single    Spouse name: Not on file   Number of children: 1   Years of education: Not on file   Highest education level: Not on file  Occupational History   Not on file  Tobacco Use   Smoking status: Former    Types: Cigarettes   Smokeless tobacco: Never  Vaping Use   Vaping status: Never Used  Substance and Sexual Activity   Alcohol use: Not Currently   Drug use: No   Sexual activity: Not Currently    Birth control/protection: Surgical    Comment: tubal & ablation  Other Topics Concern   Not on file  Social  History Narrative   Lives home alone   Social Drivers of Health   Financial Resource Strain: Low Risk  (08/08/2021)   Overall Financial Resource Strain (CARDIA)    Difficulty of Paying Living Expenses: Not very hard  Food Insecurity: No Food Insecurity (11/22/2023)   Hunger Vital Sign    Worried About Running Out of Food in the Last Year: Never true    Ran Out of Food in the Last Year: Never true  Transportation Needs: No Transportation Needs (11/22/2023)   PRAPARE - Administrator, Civil Service (Medical): No    Lack of Transportation (Non-Medical): No  Physical Activity: Sufficiently Active (08/08/2021)   Exercise Vital Sign    Days of Exercise per  Week: 6 days    Minutes of Exercise per Session: 30 min  Stress: No Stress Concern Present (08/08/2021)   [employer] of Occupational Health - Occupational Stress Questionnaire    Feeling of Stress : Only a little  Social Connections: Moderately Integrated (11/22/2023)   Social Connection and Isolation Panel [NHANES]    Frequency of Communication with Friends and Family: More than three times a week    Frequency of Social Gatherings with Friends and Family: Once a week    Attends Religious Services: More than 4 times per year    Active Member of [employer] or Organizations: No    Attends Banker Meetings: Patient declined    Marital Status: Living with partner  Intimate Partner Violence: Unknown (11/25/2023)   Humiliation, Afraid, Rape, and Kick questionnaire    Fear of Current or Ex-Partner: No    Emotionally Abused: No    Physically Abused: No    Sexually Abused: Not on file    Outpatient Medications Prior to Visit  Medication Sig Dispense Refill   Bacillus Coagulans-Inulin (PROBIOTIC-PREBIOTIC PO) Take 1 capsule by mouth daily.     Cetirizine HCl (ZYRTEC PO) Take 1 tablet by mouth daily.     Drospirenone (SLYND) 4 MG TABS Take 1 tablet (4 mg total) by mouth daily. 28 tablet 12   gabapentin (NEURONTIN) 100 MG capsule TAKE 2 CAPSULES BY MOUTH AT NIGHT 60 capsule 4   lactulose (CHRONULAC) 10 GM/15ML solution Take 15 mLs (10 g total) by mouth 2 (two) times daily as needed for mild constipation or moderate constipation. 473 mL 1   metoprolol tartrate (LOPRESSOR) 25 MG tablet Take 1 tablet (25 mg total) by mouth 2 (two) times daily. 180 tablet 3   montelukast (SINGULAIR) 10 MG tablet TAKE 1 TABLET BY MOUTH EVERYDAY AT BEDTIME 90 tablet 2   oxyCODONE-acetaminophen (PERCOCET/ROXICET) 5-325 MG tablet Take 1 tablet by mouth daily as needed for moderate pain (pain score 4-6).     pantoprazole (PROTONIX) 40 MG tablet TAKE 1 TABLET BY MOUTH TWICE A DAY 180 tablet 2   RESTASIS 0.05 %  ophthalmic emulsion Place 1 drop into both eyes 2 (two) times daily.     traZODone (DESYREL) 150 MG tablet TAKE 1 TABLET BY MOUTH EVERYDAY AT BEDTIME 90 tablet 3   venlafaxine XR (EFFEXOR-XR) 75 MG 24 hr capsule TAKE 1 CAPSULE BY MOUTH DAILY WITH BREAKFAST. 90 capsule 2   cyclobenzaprine (FLEXERIL) 5 MG tablet Take 5 mg by mouth daily. (Patient not taking: Reported on 12/25/2023)     calcium carbonate (TUMS - DOSED IN MG ELEMENTAL CALCIUM) 500 MG chewable tablet Chew 1 tablet by mouth daily as needed for indigestion or heartburn. (Patient not taking: Reported on 12/16/2023)  No facility-administered medications prior to visit.    Allergies  Allergen Reactions   Bee Venom Swelling   Fentanyl Nausea And Vomiting    Prefers morphine if need be   Onion Rash   Tape Rash    ROS Review of Systems  Constitutional:  Negative for chills and fever.  Eyes:  Negative for visual disturbance.  Respiratory:  Negative for chest tightness and shortness of breath.   Musculoskeletal:  Positive for arthralgias.  Neurological:  Negative for dizziness and headaches.  Psychiatric/Behavioral:  Positive for sleep disturbance.       Objective:    Physical Exam HENT:     Head: Normocephalic.     Mouth/Throat:     Mouth: Mucous membranes are moist.  Cardiovascular:     Rate and Rhythm: Normal rate.     Heart sounds: Normal heart sounds.  Pulmonary:     Effort: Pulmonary effort is normal.     Breath sounds: Normal breath sounds.  Neurological:     Mental Status: She is alert.     BP 112/78   Pulse 70   Resp 16   Ht 5\' 2"  (1.575 m)   Wt 172 lb (78 kg)   SpO2 96%   BMI 31.46 kg/m  Wt Readings from Last 3 Encounters:  12/25/23 172 lb (78 kg)  12/16/23 171 lb (77.6 kg)  12/12/23 171 lb (77.6 kg)    Lab Results  Component Value Date   TSH 3.270 08/26/2023   Lab Results  Component Value Date   WBC 10.0 11/26/2023   HGB 12.0 11/26/2023   HCT 36.6 11/26/2023   MCV 90.1 11/26/2023    PLT 255 11/26/2023   Lab Results  Component Value Date   NA 142 12/09/2023   K 4.3 12/09/2023   CO2 19 (L) 12/09/2023   GLUCOSE 73 12/09/2023   BUN 9 12/09/2023   CREATININE 0.78 12/09/2023   BILITOT 0.3 12/09/2023   ALKPHOS 79 12/09/2023   AST 14 12/09/2023   ALT 22 12/09/2023   PROT 7.3 12/09/2023   ALBUMIN 4.2 12/09/2023   CALCIUM 9.6 12/09/2023   ANIONGAP 10 11/26/2023   EGFR 97 12/09/2023   Lab Results  Component Value Date   CHOL 207 (H) 11/22/2023   Lab Results  Component Value Date   HDL 61 11/22/2023   Lab Results  Component Value Date   LDLCALC 131 (H) 11/22/2023   Lab Results  Component Value Date   TRIG 61 11/23/2023   Lab Results  Component Value Date   CHOLHDL 3.4 11/22/2023   Lab Results  Component Value Date   HGBA1C 5.2 08/26/2023      Assessment & Plan:  Insomnia, unspecified type Assessment & Plan: Encourage good sleep hygiene (consistent sleep schedule, avoiding screens before bed, and a cool sleeping environment). Encouraged discussing alternative treatments or medication adjustments with her gynecologist if hot flashes continue to disrupt sleep. Encouraged to continue her current treatment regimen        LLQ pain Assessment & Plan: The patient was encouraged to start taking Gabapentin 100 mg three times daily (TID). A prescription for Gabapentin 300 mg will be sent to the pharmacy once she has completed the 100 mg TID prescription.She was also encouraged to engage in low-impact exercises, physical therapy, and heat/cold therapy for additional pain relief. The patient was advised to follow up if symptoms worsen or fail to improve.   Note: This chart has been completed using Chief Executive Officer, and  while attempts have been made to ensure accuracy, certain words and phrases may not be transcribed as intended.    Follow-up: No follow-ups on file.   Gilmore Laroche, FNP

## 2023-12-26 NOTE — Assessment & Plan Note (Signed)
The patient was encouraged to start taking Gabapentin 100 mg three times daily (TID). A prescription for Gabapentin 300 mg will be sent to the pharmacy once she has completed the 100 mg TID prescription.She was also encouraged to engage in low-impact exercises, physical therapy, and heat/cold therapy for additional pain relief. The patient was advised to follow up if symptoms worsen or fail to improve.

## 2023-12-26 NOTE — Assessment & Plan Note (Addendum)
Encourage good sleep hygiene (consistent sleep schedule, avoiding screens before bed, and a cool sleeping environment). Encouraged discussing alternative treatments or medication adjustments with her gynecologist if hot flashes continue to disrupt sleep. Encouraged to continue her current treatment regimen

## 2023-12-29 ENCOUNTER — Encounter: Payer: Self-pay | Admitting: Family Medicine

## 2023-12-30 ENCOUNTER — Ambulatory Visit (HOSPITAL_COMMUNITY)
Admission: RE | Admit: 2023-12-30 | Discharge: 2023-12-30 | Disposition: A | Discharge status: Home / Self Care | Payer: 59 | Source: Ambulatory Visit | Attending: Internal Medicine | Admitting: Internal Medicine

## 2023-12-30 DIAGNOSIS — Z136 Encounter for screening for cardiovascular disorders: Secondary | ICD-10-CM | POA: Diagnosis not present

## 2023-12-30 DIAGNOSIS — K8020 Calculus of gallbladder without cholecystitis without obstruction: Secondary | ICD-10-CM | POA: Diagnosis not present

## 2023-12-30 DIAGNOSIS — Z8249 Family history of ischemic heart disease and other diseases of the circulatory system: Secondary | ICD-10-CM | POA: Insufficient documentation

## 2023-12-30 DIAGNOSIS — J9811 Atelectasis: Secondary | ICD-10-CM | POA: Diagnosis not present

## 2023-12-30 MED ORDER — IOHEXOL 350 MG/ML SOLN
80.0000 mL | Freq: Once | INTRAVENOUS | Status: AC | PRN
  Administered 2023-12-30: 80 mL via INTRAVENOUS

## 2024-01-08 NOTE — Telephone Encounter (Signed)
Kindly schedule an office visit

## 2024-01-09 DIAGNOSIS — G894 Chronic pain syndrome: Secondary | ICD-10-CM | POA: Diagnosis not present

## 2024-01-09 DIAGNOSIS — M545 Low back pain, unspecified: Secondary | ICD-10-CM | POA: Diagnosis not present

## 2024-01-09 DIAGNOSIS — M25559 Pain in unspecified hip: Secondary | ICD-10-CM | POA: Diagnosis not present

## 2024-01-09 DIAGNOSIS — Z79891 Long term (current) use of opiate analgesic: Secondary | ICD-10-CM | POA: Diagnosis not present

## 2024-01-09 NOTE — Telephone Encounter (Signed)
 Called patient, she is okay right now

## 2024-01-10 DIAGNOSIS — Z419 Encounter for procedure for purposes other than remedying health state, unspecified: Secondary | ICD-10-CM | POA: Diagnosis not present

## 2024-01-19 ENCOUNTER — Encounter (INDEPENDENT_AMBULATORY_CARE_PROVIDER_SITE_OTHER): Payer: Self-pay

## 2024-01-20 ENCOUNTER — Other Ambulatory Visit (INDEPENDENT_AMBULATORY_CARE_PROVIDER_SITE_OTHER): Payer: Self-pay | Admitting: Gastroenterology

## 2024-01-20 ENCOUNTER — Encounter (INDEPENDENT_AMBULATORY_CARE_PROVIDER_SITE_OTHER): Payer: Self-pay | Admitting: Gastroenterology

## 2024-01-20 ENCOUNTER — Ambulatory Visit (INDEPENDENT_AMBULATORY_CARE_PROVIDER_SITE_OTHER): Payer: 59 | Admitting: Gastroenterology

## 2024-01-20 ENCOUNTER — Encounter (INDEPENDENT_AMBULATORY_CARE_PROVIDER_SITE_OTHER): Payer: Self-pay

## 2024-01-20 VITALS — BP 118/82 | HR 89 | Temp 97.1°F | Ht 62.0 in | Wt 173.6 lb

## 2024-01-20 DIAGNOSIS — K5900 Constipation, unspecified: Secondary | ICD-10-CM | POA: Diagnosis not present

## 2024-01-20 DIAGNOSIS — R194 Change in bowel habit: Secondary | ICD-10-CM

## 2024-01-20 MED ORDER — LUBIPROSTONE 24 MCG PO CAPS: 24.0000 ug | ORAL_CAPSULE | Freq: Two times a day (BID) | ORAL | 1 refills | Status: DC

## 2024-01-20 MED ORDER — NALOXEGOL OXALATE 12.5 MG PO TABS: 12.5000 mg | ORAL_TABLET | Freq: Every day | ORAL | 1 refills | Status: DC

## 2024-01-20 NOTE — Patient Instructions (Signed)
 Will be scheduled for colonoscopy for further evaluation of your change in bowel habits I have sent Movantik 12.5 mg to your pharmacy, take this once a day for constipation Please continue with good water intake aiming for at least 64 ounces per day as well as diet high in fruits, veggies, whole grains Try to limit your opiate pain medicine as much as possible  Follow-up 4 months  It was a pleasure to see you today. I want to create trusting relationships with patients and provide genuine, compassionate, and quality care. I truly value your feedback! please be on the lookout for a survey regarding your visit with me today. I appreciate your input about our visit and your time in completing this!    Travonte Byard L. Jeanmarie Hubert, MSN, APRN, AGNP-C Adult-Gerontology Nurse Practitioner [facility] at [facility]

## 2024-01-20 NOTE — Progress Notes (Signed)
 Referring Provider: Gilmore Laroche, FNP Primary Care Physician:  Gilmore Laroche, FNP Primary GI Physician: Dr. Levon Hedger   Chief Complaint  Patient presents with   New Patient (Initial Visit)    Patient here today due to having issues with constipation. She takes colace as needed. She had been given Lactulose in the past, but says she did not tolerate this well,so she stopped this. She reports having a recent ct scan on 12/30/2023 that showed she had gallstones,and has no gallbladder.    HPI:   Kristina Delacruz is a 43 y.o. female with past medical history of arctic valve insufficiency, GERD, mitral valve insufficiency, anxiety, gallstone pancreatitis (s/p GB removal 11/2023)   Patient presenting today for:  Constipation, change in bowel habits   Patient with recent hospital admission on 11/22/23 after presenting with abdominal pain, nausea and vomiting. Lipase 3303, AST 1406, ALT 1192, T bili 2.9, calcium of 9.2.  CBC with WBC 11.1, rest within normal limits.  Ultrasound of the abdomen/right upper quadrant showed multiple gallstones in the gallbladder without cholecystitis, normal CBD.  A CT of the abdomen and pelvis with IV contrast showed inflammation at the head of the pancreas with stranding and fluid tracking in the pericolic gutters consistent with interstitial pancreatitis, cholelithiasis.  Normal CBD.   LFTs trended down and patient had cholecystectomy on 1/14, advised to start bowel regimen due to concern for risk for constipation secondary to opiate pain meds   Labs: TSH 3.27 08/2023 CMP and CBC WNL 11/2023  Present:  Patient reports constipation for about 2 years since she had a tubal ligation. She is taking colace PRN. Can sometimes go up to 1-2 weeks without a BM. She has some abdominal pain when she gets very constipated. Did take lactulose for a while but did not tolerate this as it made her nauseated. Could not tolerate miralax. Has tried senokot which she felt made her  more constipated. She takes her oxycodone once per day, she has been on this since around September 2024. Denies rectal bleeding or melena. No weight loss or changes in appetite. She notes that prior to GB removal stools were very hard. She notes that now stools are very soft to watery but has issues where she has to strain and rock back and forth to have a BM. She is taking a probiotic and drinking plenty of water.   Patient is concerned that she had a CT on 2/18 that showed gallstones despite gb removal on 11/2023. She notes on 2/16 she had an episode of abdominal pain that was generalized with pain in her back as well. She notes some nausea and vomiting, symptoms lasted approximately 2 hours and then resolved. She has had no further episodes like this.   Social: no etoh or tobacco  Family hx: grandfather had CRC   Last Colonoscopy: never  Last Endoscopy: never  Recommendations:   Filed Weights   01/20/24 1414  Weight: 173 lb 9.6 oz (78.7 kg)     Past Medical History:  Diagnosis Date   Aortic valve insufficiency    Arthritis    GERD (gastroesophageal reflux disease)    Mitral valve disorder    Stress fracture of cervical vertebra    Trichimoniasis 08/13/2021   08/13/21 treated with flagyl.    Past Surgical History:  Procedure Laterality Date   DILATATION AND CURETTAGE/HYSTEROSCOPY WITH MINERVA N/A 04/24/2022   Procedure: DILATATION AND CURETTAGE/HYSTEROSCOPY WITH MINERVA;  Surgeon: Lazaro Arms, MD;  Location: [facility];  Service: Gynecology;  Laterality: N/A;   ESSURE TUBAL LIGATION Bilateral    GALLBLADDER SURGERY  11/25/2023   LAPAROSCOPIC BILATERAL SALPINGECTOMY N/A 04/24/2022   Procedure: LAPAROSCOPIC BILATERAL SALPINGECTOMY; removal foreign object;  Surgeon: Lazaro Arms, MD;  Location: [facility];  Service: Gynecology;  Laterality: N/A;   TUBAL LIGATION      Current Outpatient Medications  Medication Sig Dispense Refill   Bacillus Coagulans-Inulin (PROBIOTIC-PREBIOTIC PO)  Take 1 capsule by mouth daily.     Cetirizine HCl (ZYRTEC PO) Take 1 tablet by mouth daily.     cyclobenzaprine (FLEXERIL) 5 MG tablet Take 5 mg by mouth daily.     Drospirenone (SLYND) 4 MG TABS Take 1 tablet (4 mg total) by mouth daily. 28 tablet 12   gabapentin (NEURONTIN) 100 MG capsule TAKE 2 CAPSULES BY MOUTH AT NIGHT (Patient taking differently: 100 mg. 100 mg in am and 200 mg at bedtime.) 60 capsule 4   metoprolol tartrate (LOPRESSOR) 25 MG tablet Take 1 tablet (25 mg total) by mouth 2 (two) times daily. 180 tablet 3   montelukast (SINGULAIR) 10 MG tablet TAKE 1 TABLET BY MOUTH EVERYDAY AT BEDTIME 90 tablet 2   OVER THE COUNTER MEDICATION Biotin one at bedtime.     oxyCODONE-acetaminophen (PERCOCET/ROXICET) 5-325 MG tablet Take 1 tablet by mouth daily as needed for moderate pain (pain score 4-6).     pantoprazole (PROTONIX) 40 MG tablet TAKE 1 TABLET BY MOUTH TWICE A DAY 180 tablet 2   RESTASIS 0.05 % ophthalmic emulsion Place 1 drop into both eyes 2 (two) times daily.     traZODone (DESYREL) 150 MG tablet TAKE 1 TABLET BY MOUTH EVERYDAY AT BEDTIME 90 tablet 3   venlafaxine XR (EFFEXOR-XR) 75 MG 24 hr capsule TAKE 1 CAPSULE BY MOUTH DAILY WITH BREAKFAST. 90 capsule 2   lactulose (CHRONULAC) 10 GM/15ML solution Take 15 mLs (10 g total) by mouth 2 (two) times daily as needed for mild constipation or moderate constipation. (Patient not taking: Reported on 01/20/2024) 473 mL 1   No current facility-administered medications for this visit.    Allergies as of 01/20/2024 - Review Complete 01/20/2024  Allergen Reaction Noted   Bee venom Swelling 08/08/2021   Fentanyl Nausea And Vomiting 11/22/2023   Onion Rash 08/08/2021   Tape Rash 12/23/2022    Social History   Socioeconomic History   Marital status: Single    Spouse name: Not on file   Number of children: 1   Years of education: Not on file   Highest education level: Not on file  Occupational History   Not on file  Tobacco Use    Smoking status: Former    Types: Cigarettes   Smokeless tobacco: Never  Vaping Use   Vaping status: Never Used  Substance and Sexual Activity   Alcohol use: Not Currently   Drug use: No   Sexual activity: Not Currently    Birth control/protection: Surgical    Comment: tubal & ablation  Other Topics Concern   Not on file  Social History Narrative   Lives home alone   Social Drivers of Health   Financial Resource Strain: Low Risk  (08/08/2021)   Overall Financial Resource Strain (CARDIA)    Difficulty of Paying Living Expenses: Not very hard  Food Insecurity: No Food Insecurity (11/22/2023)   Hunger Vital Sign    Worried About Running Out of Food in the Last Year: Never true    Ran Out of Food in the Last Year:  Never true  Transportation Needs: No Transportation Needs (11/22/2023)   PRAPARE - Administrator, Civil Service (Medical): No    Lack of Transportation (Non-Medical): No  Physical Activity: Sufficiently Active (08/08/2021)   Exercise Vital Sign    Days of Exercise per Week: 6 days    Minutes of Exercise per Session: 30 min  Stress: No Stress Concern Present (08/08/2021)   [employer] of Occupational Health - Occupational Stress Questionnaire    Feeling of Stress : Only a little  Social Connections: Moderately Integrated (11/22/2023)   Social Connection and Isolation Panel [NHANES]    Frequency of Communication with Friends and Family: More than three times a week    Frequency of Social Gatherings with Friends and Family: Once a week    Attends Religious Services: More than 4 times per year    Active Member of [employer] or Organizations: No    Attends Engineer, structural: Patient declined    Marital Status: Living with partner    Review of systems General: negative for malaise, night sweats, fever, chills, weight loss Neck: Negative for lumps, goiter, pain and significant neck swelling Resp: Negative for cough, wheezing, dyspnea at rest CV:  Negative for chest pain, leg swelling, palpitations, orthopnea GI: denies melena, hematochezia, nausea, vomiting, diarrhea, dysphagia, odyonophagia, early satiety or unintentional weight loss. +constipation  MSK: Negative for joint pain or swelling, back pain, and muscle pain. Derm: Negative for itching or rash Psych: Denies depression, anxiety, memory loss, confusion. No homicidal or suicidal ideation.  Heme: Negative for prolonged bleeding, bruising easily, and swollen nodes. Endocrine: Negative for cold or heat intolerance, polyuria, polydipsia and goiter. Neuro: negative for tremor, gait imbalance, syncope and seizures. The remainder of the review of systems is noncontributory.  Physical Exam: BP 118/82 (BP Location: Right Arm, Patient Position: Sitting, Cuff Size: Normal)   Pulse 89   Temp (!) 97.1 F (36.2 C) (Temporal)   Ht 5\' 2"  (1.575 m)   Wt 173 lb 9.6 oz (78.7 kg)   BMI 31.75 kg/m  General:   Alert and oriented. No distress noted. Pleasant and cooperative.  Head:  Normocephalic and atraumatic. Eyes:  Conjuctiva clear without scleral icterus. Mouth:  Oral mucosa pink and moist. Good dentition. No lesions. Heart: Normal rate and rhythm, s1 and s2 heart sounds present.  Lungs: Clear lung sounds in all lobes. Respirations equal and unlabored. Abdomen:  +BS, soft, non-tender and non-distended. No rebound or guarding. No HSM or masses noted. Derm: No palmar erythema or jaundice Msk:  Symmetrical without gross deformities. Normal posture. Extremities:  Without edema. Neurologic:  Alert and  oriented x4 Psych:  Alert and cooperative. Normal mood and affect.  Invalid input(s): "6 MONTHS"   ASSESSMENT: NAMITA YEARWOOD is a 43 y.o. female presenting today for constipation and change in bowel habits  Constipation/change in bowel habits: Patient reports change in bowel habits with new onset constipation about 2 years ago after her tubal ligation.  She is notably on opiate pain  medication though she did not start this until late 2024.  She denies any weight loss, rectally, melena.  Has tried Colace as needed without much improvement, did not tolerate lactulose or MiraLAX, felt Senokot made her constipation worse.  Recent thyroid testing and electrolytes are within normal limits.  Interestingly enough she notes that stools are loose to watery though she still feels she cannot defecate without straining.  She does have family history of colorectal cancer though this was  in her grandfather, does not necessarily increase her risk of colorectal cancer, however given change in bowel habits, recommend proceeding colonoscopy further evaluation. Indications, risks and benefits of procedure discussed in detail with patient. Patient verbalized understanding and is in agreement to proceed with Colonoscopy.  For now we will start Movantik 12.5 mg daily.  Patient with recent history of gallstone pancreatitis, status post cholecystectomy on 11/25/2023.  Notably she had a CT scan on 2/18 which showed gallstones in the upper abdomen.  She had 1 episode of abdominal pain with some nausea and vomiting on 2/16 that resolved spontaneously after about 2 hours.  LFTs were normal at the end of January.  Findings on recent CT are a bit perplexing given her recent gallbladder removal and no mention of dilation of the bile ducts.  Advised patient to make me aware if she has recurrence of upper abdominal pain, nausea, vomiting as we need to obtain stat imaging and CMP.   PLAN:  -schedule colonoscopy-ASA II  -movantik 12.5mg  daily -limit opiates as much as possible -continue with good water intake, aim for 6 4 ounces per day -diet high in fruits, veggies, whole grains, kiwi and prunes are especially good. -pt to make me aware of recurrent of abdominal pain/Nausea-would need stat imaging and CMP  All questions were answered, patient verbalized understanding and is in agreement with plan as outlined above.    Follow Up: 4 months   Tamura Lasky L. Jeanmarie Hubert, MSN, APRN, AGNP-C Adult-Gerontology Nurse Practitioner [facility]

## 2024-01-21 ENCOUNTER — Telehealth (INDEPENDENT_AMBULATORY_CARE_PROVIDER_SITE_OTHER): Payer: Self-pay

## 2024-01-21 ENCOUNTER — Other Ambulatory Visit (INDEPENDENT_AMBULATORY_CARE_PROVIDER_SITE_OTHER): Payer: Self-pay

## 2024-01-21 MED ORDER — PEG 3350-KCL-NA BICARB-NACL 420 G PO SOLR
4000.0000 mL | Freq: Once | ORAL | 0 refills | Status: AC
Start: 2024-01-21 — End: 2024-01-21

## 2024-01-21 NOTE — Telephone Encounter (Signed)
 Elmarie Mainland (KeyLeo Rod) PA Case ID #: [id number] Rx #: [id number] Need Help? Call us at [phone] Outcome Approved today by [facility] NCPDP 2017 Your PA request has been approved. Additional information will be provided in the approval communication. (Message 1145) Effective Date: 01/21/2024 Authorization Expiration Date: 01/20/2027 Drug Lubiprostone capsules ePA cloud logo Form [employer] Electronic PA Form [phone] NCPDP) Original Claim Info 75 PRIOR AUTH REQ-MD CALL [phone] REQUIRES PRIOR AUTHORIZATION(PHARMACY HELP DESK [id number])

## 2024-01-21 NOTE — Addendum Note (Signed)
 Addended by: Marlowe Shores on: 01/21/2024 01:44 PM   Modules accepted: Orders

## 2024-01-21 NOTE — Telephone Encounter (Signed)
 Is the patient on Amitzia or Movantik or both? I received a Prior Auth on patient for Lubiprostone 24 mcg one po bid,which is on the patient med list and looks like it was submitted at her ov yesterday 01/20/2024. The note says to start on Movantik. Please advise if patient supposed to be on one or the other or both. Thanks,

## 2024-01-21 NOTE — Telephone Encounter (Signed)
 Kristina Delacruz (KeyLeo Rod) PA Case ID #: [id number] Rx #: [id number] Need Help? Call us at [phone] Outcome Approved today by [facility] NCPDP 2017 Your PA request has been approved. Additional information will be provided in the approval communication. (Message 1145) Effective Date: 01/21/2024 Authorization Expiration Date: 01/20/2027 Drug Lubiprostone capsules ePA cloud logo Form [employer] Electronic PA Form [phone] NCPDP) Original Claim Info 75 PRIOR AUTH REQ-MD CALL [phone] REQUIRES PRIOR AUTHORIZATION(PHARMACY HELP DESK [phone])        Electronically signed by Meredith Leeds, CMA at 01/21/2024  1:43 PM

## 2024-02-10 ENCOUNTER — Encounter: Payer: Self-pay | Admitting: Adult Health

## 2024-02-10 ENCOUNTER — Ambulatory Visit: Payer: 59 | Admitting: Adult Health

## 2024-02-10 VITALS — BP 129/86 | HR 73 | Ht 62.0 in | Wt 178.5 lb

## 2024-02-10 DIAGNOSIS — R4589 Other symptoms and signs involving emotional state: Secondary | ICD-10-CM | POA: Diagnosis not present

## 2024-02-10 DIAGNOSIS — Z1331 Encounter for screening for depression: Secondary | ICD-10-CM

## 2024-02-10 DIAGNOSIS — R232 Flushing: Secondary | ICD-10-CM

## 2024-02-10 NOTE — Progress Notes (Signed)
  Subjective:     Patient ID: Kristina Delacruz, female   DOB: 03-09-1981, 43 y.o.   MRN: [id number]  HPI Lucindia is a 43 year old white female,single, G1P1001 back in follow up on taking effexor XR 75 mg 1 daily and moods are better and hot flashes some better too.     Component Value Date/Time   DIAGPAP  08/08/2021 1623    - Negative for intraepithelial lesion or malignancy (NILM)   HPVHIGH Negative 08/08/2021 1623   ADEQPAP  08/08/2021 1623    Satisfactory for evaluation; transformation zone component PRESENT.    PCP is Sharen Hones NP  Review of Systems Moods are better and hot flashes some better too Reviewed past medical,surgical, social and family history. Reviewed medications and allergies.     Objective:   Physical Exam BP 129/86 (BP Location: Left Arm, Patient Position: Sitting, Cuff Size: Normal)   Pulse 73   Ht 5\' 2"  (1.575 m)   Wt 178 lb 8 oz (81 kg)   BMI 32.65 kg/m     Skin warm and dry. Lungs: clear to ausculation bilaterally. Cardiovascular: regular rate and rhythm.  Fall risk is moderate    02/10/2024    3:14 PM 12/16/2023    3:33 PM 12/09/2023    3:04 PM  Depression screen PHQ 2/9  Decreased Interest 1 1 0  Down, Depressed, Hopeless 0 1 0  PHQ - 2 Score 1 2 0  Altered sleeping 2 3 0  Tired, decreased energy 1 1 0  Change in appetite 0 0 0  Feeling bad or failure about yourself  0 0 0  Trouble concentrating 0 0 0  Moving slowly or fidgety/restless 0 0 0  Suicidal thoughts 0 0 0  PHQ-9 Score 4 6 0  Difficult doing work/chores Somewhat difficult  Not difficult at all       02/10/2024    3:17 PM 12/16/2023    3:35 PM 12/09/2023    3:04 PM 10/21/2023    4:22 PM  GAD 7 : Generalized Anxiety Score  Nervous, Anxious, on Edge 2 1 0 0  Control/stop worrying 0 1 0 0  Worry too much - different things 1 0 0 1  Trouble relaxing 2 2 0 0  Restless 1 1 0 0  Easily annoyed or irritable 3 1 0 1  Afraid - awful might happen 0 0 0 0  Total GAD 7 Score 9 6 0 2  Anxiety  Difficulty Somewhat difficult  Not difficult at all       Upstream - 02/10/24 1512       Pregnancy Intention Screening   Does the patient want to become pregnant in the next year? No    Does the patient's partner want to become pregnant in the next year? No    Would the patient like to discuss contraceptive options today? No      Contraception Wrap Up   Current Method Female Sterilization    End Method Female Sterilization    Contraception Counseling Provided No             Assessment:     1. Moody (Primary) Moods better Will continue effexor XR 75 mg 1 daily has refills   2. Hot flashes Some better      Plan:     Return in 5 months for pap and physcial

## 2024-02-12 ENCOUNTER — Other Ambulatory Visit (INDEPENDENT_AMBULATORY_CARE_PROVIDER_SITE_OTHER): Payer: Self-pay | Admitting: Gastroenterology

## 2024-02-12 ENCOUNTER — Encounter (INDEPENDENT_AMBULATORY_CARE_PROVIDER_SITE_OTHER): Payer: Self-pay

## 2024-02-12 DIAGNOSIS — K5900 Constipation, unspecified: Secondary | ICD-10-CM

## 2024-02-13 NOTE — Anesthesia Preprocedure Evaluation (Signed)
 Anesthesia Evaluation  Patient identified by MRN, date of birth, ID band Patient awake    Reviewed: Allergy & Precautions, H&P , NPO status , Patient's Chart, lab work & pertinent test results, reviewed documented beta blocker date and time   Airway Mallampati: II  TM Distance: >3 FB Neck ROM: Full    Dental no notable dental hx. (+) Dental Advisory Given, Teeth Intact   Pulmonary pneumonia, resolved, former smoker   Pulmonary exam normal breath sounds clear to auscultation       Cardiovascular Exercise Tolerance: Good hypertension, Normal cardiovascular exam+ Valvular Problems/Murmurs AI  Rhythm:regular Rate:Normal  Mitral valve disorder   Neuro/Psych  PSYCHIATRIC DISORDERS  Depression    negative neurological ROS     GI/Hepatic Neg liver ROS,GERD  Medicated,,  Endo/Other  negative endocrine ROS    Renal/GU negative Renal ROS  negative genitourinary   Musculoskeletal  (+) Arthritis , Osteoarthritis,    Abdominal   Peds  Hematology negative hematology ROS (+)   Anesthesia Other Findings   Reproductive/Obstetrics negative OB ROS                             Anesthesia Physical Anesthesia Plan  ASA: 2  Anesthesia Plan: General   Post-op Pain Management: Minimal or no pain anticipated   Induction:   PONV Risk Score and Plan: Propofol infusion  Airway Management Planned: Nasal Cannula and Natural Airway  Additional Equipment: None  Intra-op Plan:   Post-operative Plan:   Informed Consent: I have reviewed the patients History and Physical, chart, labs and discussed the procedure including the risks, benefits and alternatives for the proposed anesthesia with the patient or authorized representative who has indicated his/her understanding and acceptance.     Dental Advisory Given  Plan Discussed with: CRNA  Anesthesia Plan Comments:         Anesthesia Quick  Evaluation

## 2024-02-14 ENCOUNTER — Other Ambulatory Visit (INDEPENDENT_AMBULATORY_CARE_PROVIDER_SITE_OTHER): Payer: Self-pay | Admitting: Gastroenterology

## 2024-02-16 ENCOUNTER — Encounter (HOSPITAL_COMMUNITY): Admission: RE | Payer: Self-pay | Source: Home / Self Care

## 2024-02-16 ENCOUNTER — Encounter (HOSPITAL_COMMUNITY): Payer: Self-pay | Admitting: Anesthesiology

## 2024-02-16 ENCOUNTER — Ambulatory Visit (HOSPITAL_COMMUNITY): Admission: RE | Admit: 2024-02-16 | Source: Home / Self Care | Admitting: Gastroenterology

## 2024-02-16 ENCOUNTER — Telehealth (INDEPENDENT_AMBULATORY_CARE_PROVIDER_SITE_OTHER): Payer: Self-pay | Admitting: Gastroenterology

## 2024-02-16 IMAGING — CT CT ABD-PELV W/ CM
4 of 5 series · 11 of 46 positions shown, 16 images · IV contrast (Omnipaque or Isovue)
Comparison: CT of the chest abdomen pelvis dated 01/08/2015.

CLINICAL DATA: Abdominal pain. Sharp chest and right arm pain.
Status post uterine ablation today.



[Series 3: abd/pel w/ · axial · 0.66mm/px · z∈[+453,+753]mm · 5 of 91 slices shown, 10 images]
[im 16/91  soft-tissue]
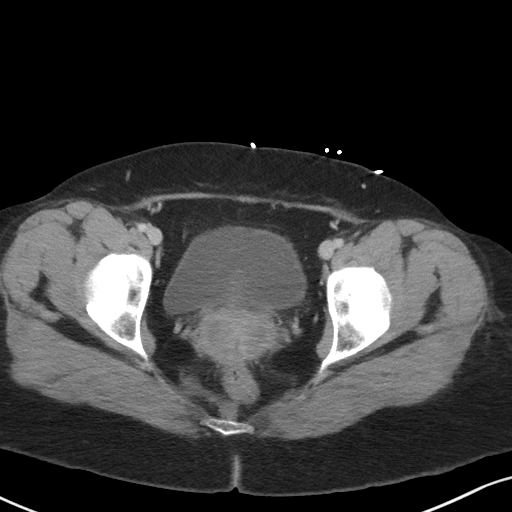
[im 16/91  bone]
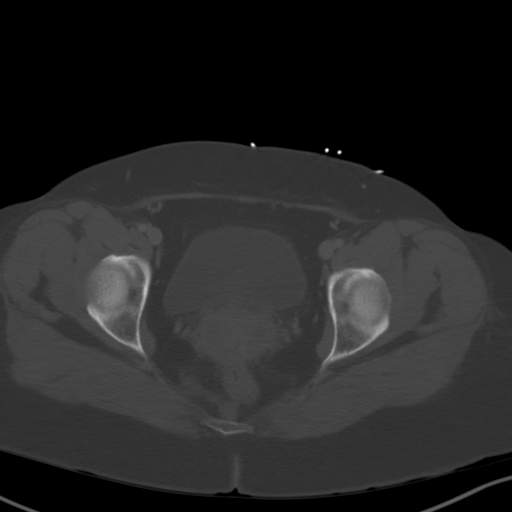
[im 31/91  soft-tissue]
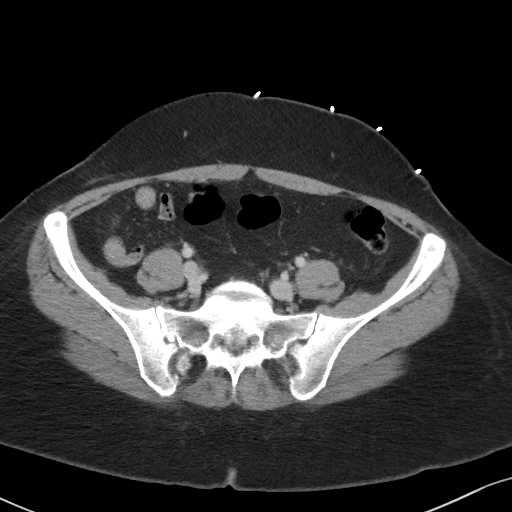
[im 31/91  lung]
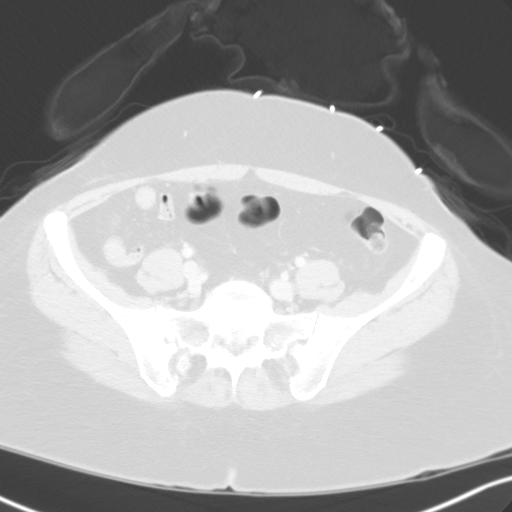
[im 46/91  soft-tissue]
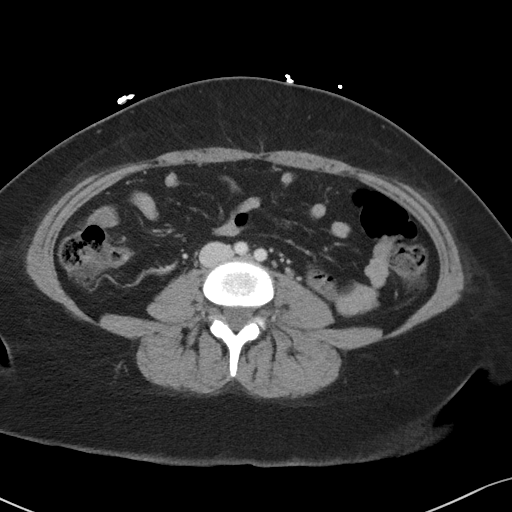
[im 46/91  lung]
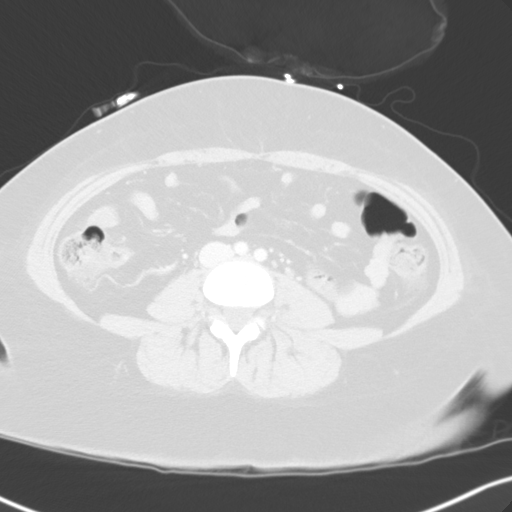
[im 61/91  soft-tissue]
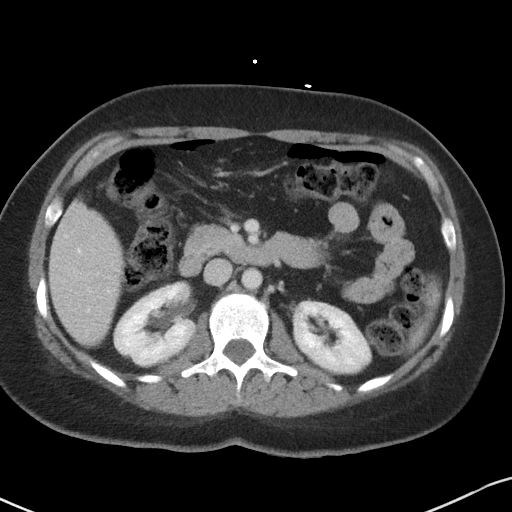
[im 61/91  lung]
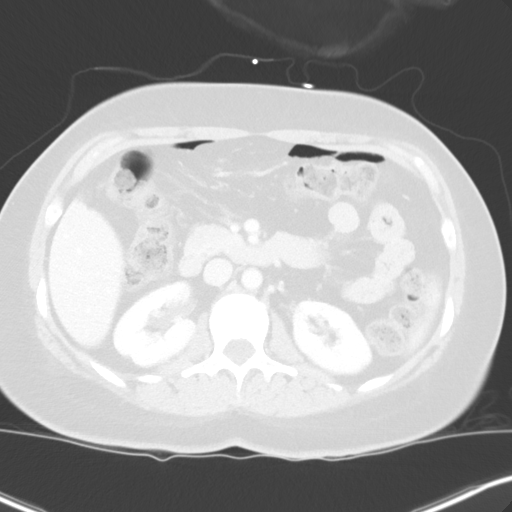
[im 76/91  soft-tissue]
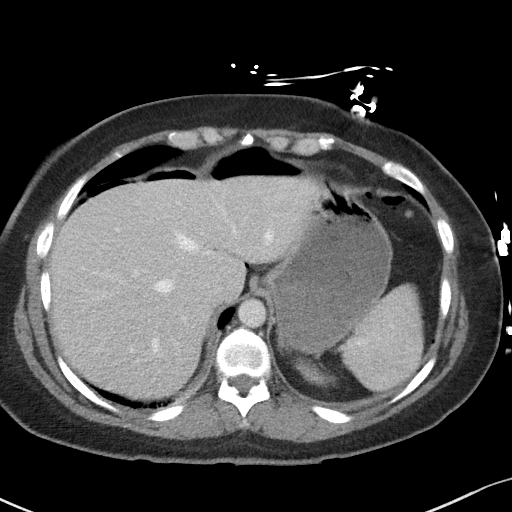
[im 76/91  lung]
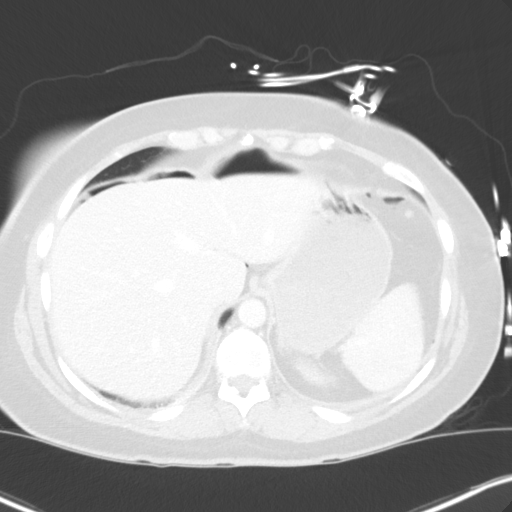

[Series 6: coronal · coronal · 0.68mm/px · 3 of 78 slices shown]
[im 26/78  soft-tissue]
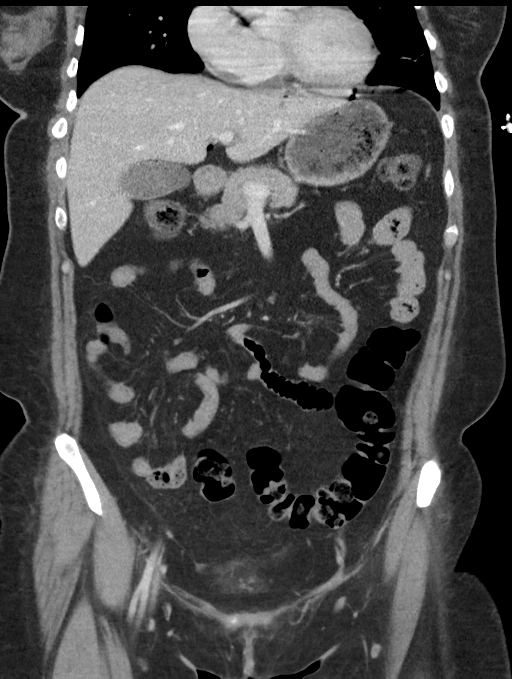
[im 35/78  soft-tissue]
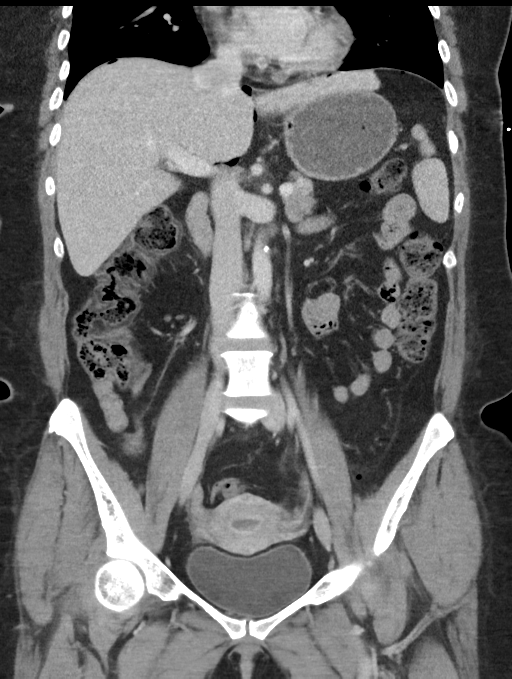
[im 43/78  soft-tissue]
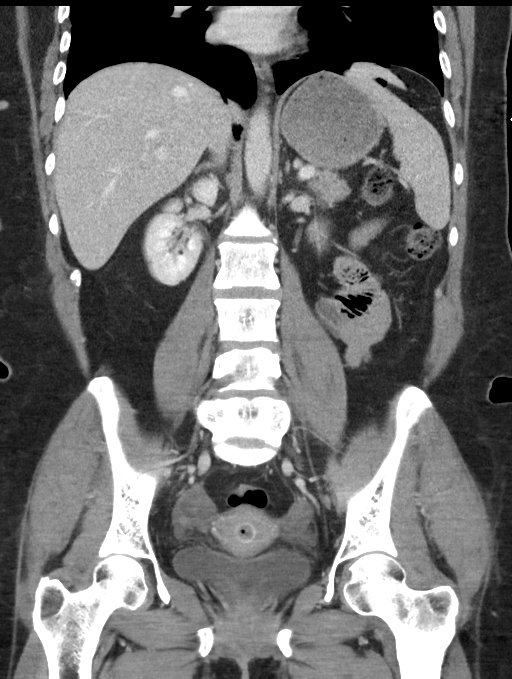

[Series 7: sagittal · sagittal · 0.48mm/px · 1 of 108 slices shown]
[im 36/108  soft-tissue]
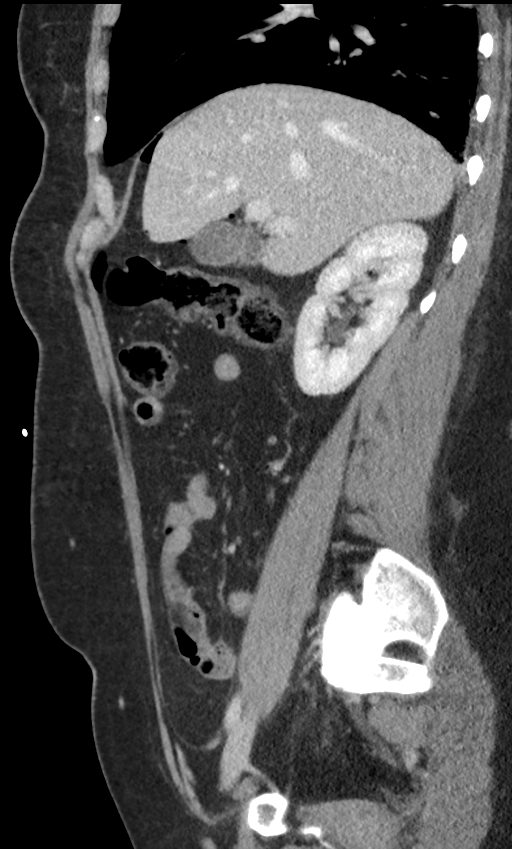

[Series 8: delays · axial · 0.66mm/px · z∈[+445,+520]mm · 2 of 89 slices shown]
[im 15/89  soft-tissue]
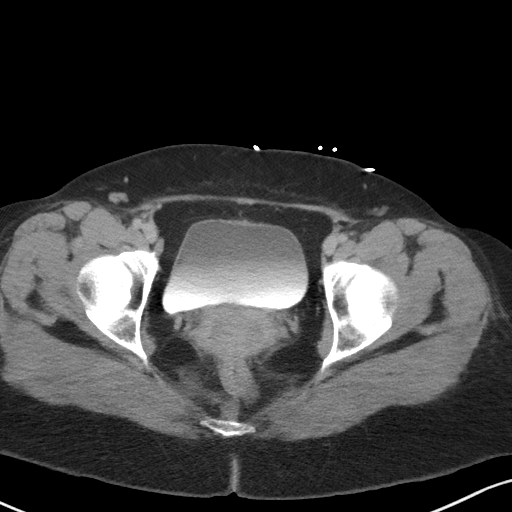
[im 30/89  soft-tissue]
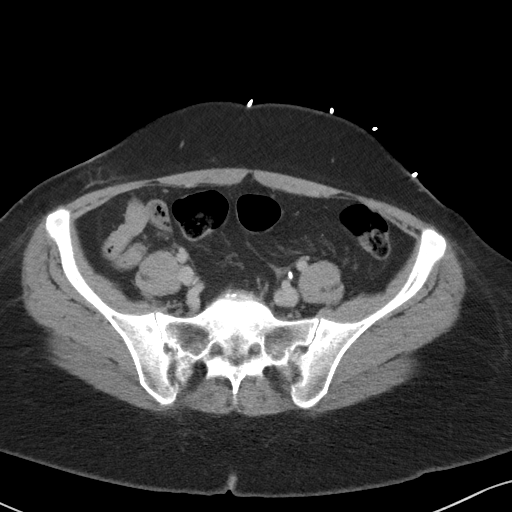

[11 of 46 positions shown; findings below may reference images not displayed]

RADIATION DOSE REDUCTION: This exam was performed according to the
departmental dose-optimization program which includes automated
exposure control, adjustment of the mA and/or kV according to
patient size and/or use of iterative reconstruction technique.

CONTRAST:  100mL OMNIPAQUE IOHEXOL 350 MG/ML SOLN
FINDINGS: CTA CHEST FINDINGS

Cardiovascular: There is no cardiomegaly or pericardial effusion.
The thoracic aorta is unremarkable. The the origins of the great
vessels of the aortic arch appear patent as visualized. No pulmonary
artery embolus identified.

Mediastinum/Nodes: No hilar or mediastinal adenopathy. The esophagus
is grossly unremarkable. No mediastinal fluid collection.

Lungs/Pleura: There are bibasilar linear atelectasis or scarring. No
focal consolidation, pleural effusion, or pneumothorax. The central
airways are patent.

Musculoskeletal: No acute osseous pathology.

Review of the MIP images confirms the above findings.

CT ABDOMEN and PELVIS FINDINGS

Moderate pneumoperitoneum.  Trace free fluid in the pelvis.

Hepatobiliary: The liver is unremarkable. There is sludge or small
stones within the gallbladder. No pericholecystic fluid or evidence
of acute cholecystitis by CT.

Pancreas: Unremarkable. No pancreatic ductal dilatation or
surrounding inflammatory changes.

Spleen: Normal in size without focal abnormality.

Adrenals/Urinary Tract: The adrenal glands unremarkable. The
kidneys, visualized ureters, and urinary bladder appear
unremarkable.

Stomach/Bowel: There is no bowel obstruction or active inflammation.
The appendix is normal.

Vascular/Lymphatic: Mild atherosclerotic calcification of the aorta.
The IVC is unremarkable. No portal venous gas. There is no
adenopathy.

Reproductive: The uterus is anteverted. The endometrium measures
approximately 1 cm in thickness. Small pocket of air within the
endometrium, likely related to recent procedure. Mild enhancement of
the endometrium, likely post procedural, likely reactive. The
ovaries are grossly unremarkable. No pelvic mass.

Other: Stranding of the periumbilical fat likely related to
laparoscopic port placement. No fluid collection.

Musculoskeletal: No acute or significant osseous findings.

Review of the MIP images confirms the above findings.
IMPRESSION: 1. No acute intrathoracic pathology. No pulmonary artery embolus
identified.
2. Moderate pneumoperitoneum, likely related to laparoscopic
surgery.
3. Small gallstones.  No evidence of acute cholecystitis by CT.
4. No bowel obstruction. Normal appendix.
5. Aortic Atherosclerosis ([id number].W).

These results were called by telephone at the time of interpretation
on 04/25/2022 at [DATE] to provider BLAIN JUMPER , who verbally
acknowledged these results.

## 2024-02-16 IMAGING — CT CT ANGIO CHEST
2 of 6 series · 16 of 46 positions shown · IV contrast (Omnipaque or Isovue)
Comparison: CT of the chest abdomen pelvis dated 01/08/2015.

CLINICAL DATA: Abdominal pain. Sharp chest and right arm pain.
Status post uterine ablation today.



[Series 5: pe lungs · axial · 0.61mm/px · z∈[+750,+932]mm · 13 of 103 slices shown]
[im 6/103  lung]
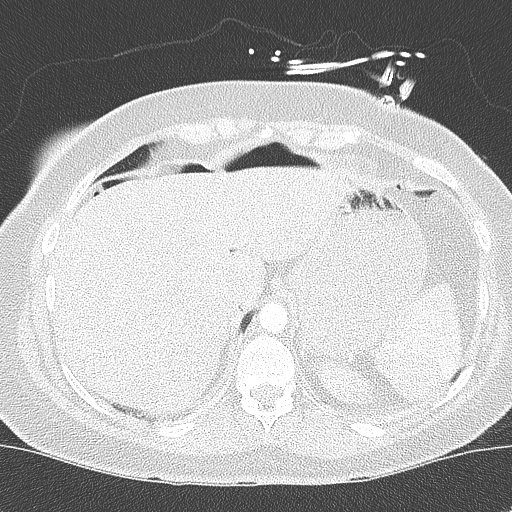
[im 12/103  soft-tissue]
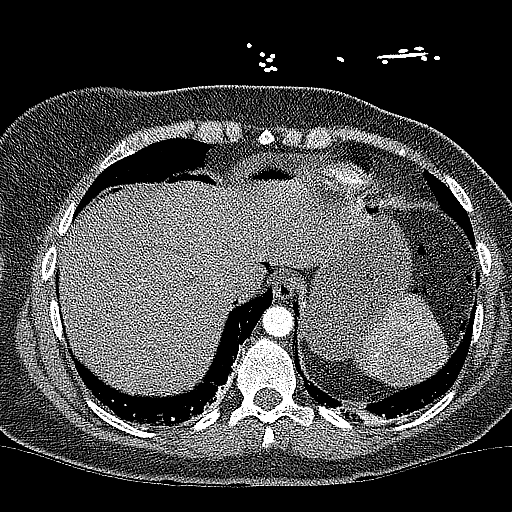
[im 23/103  lung]
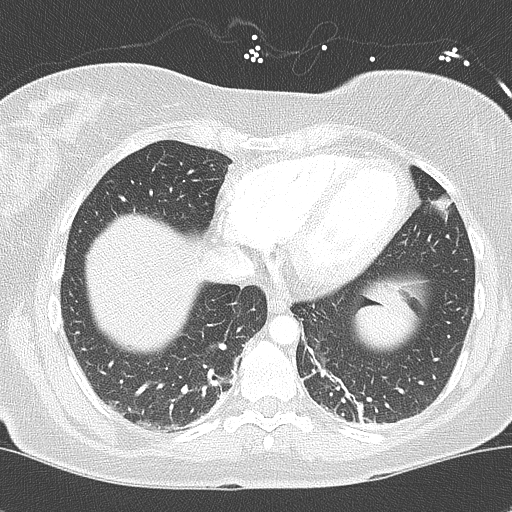
[im 29/103  soft-tissue]
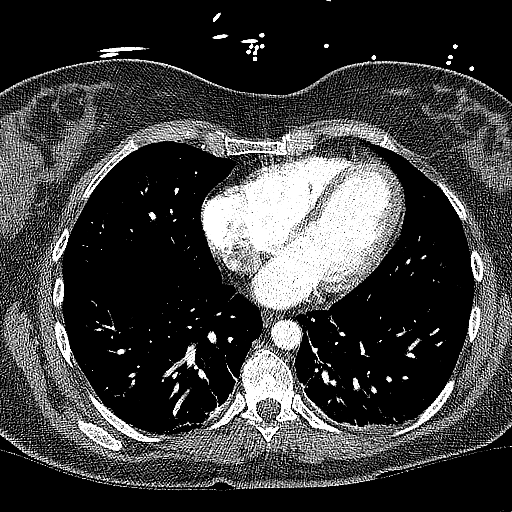
[im 35/103  lung]
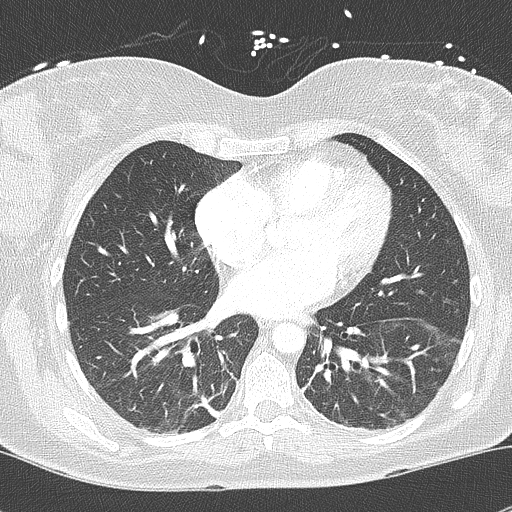
[im 46/103  soft-tissue]
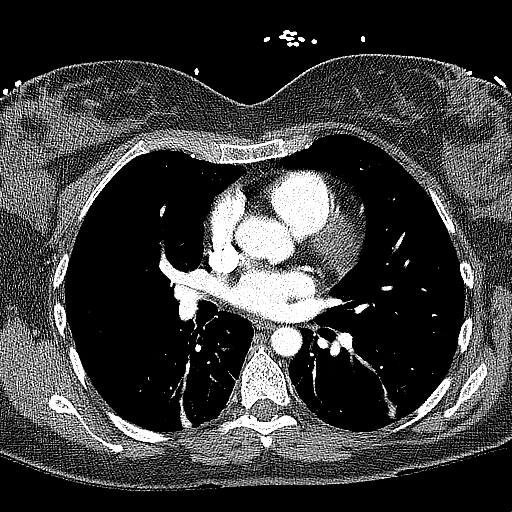
[im 52/103  lung]
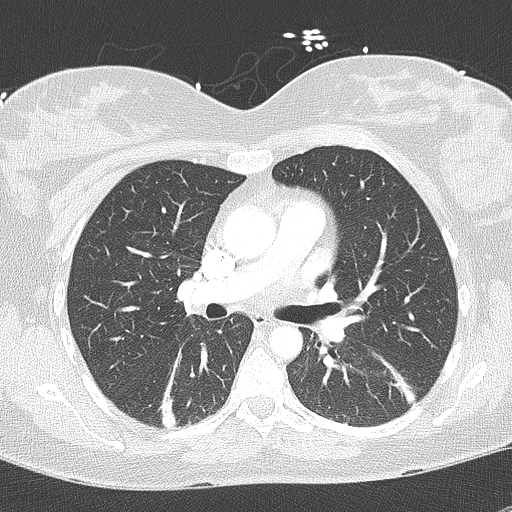
[im 57/103  soft-tissue]
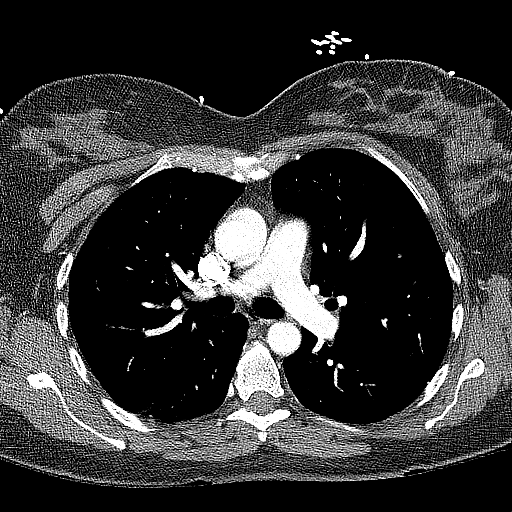
[im 69/103  lung]
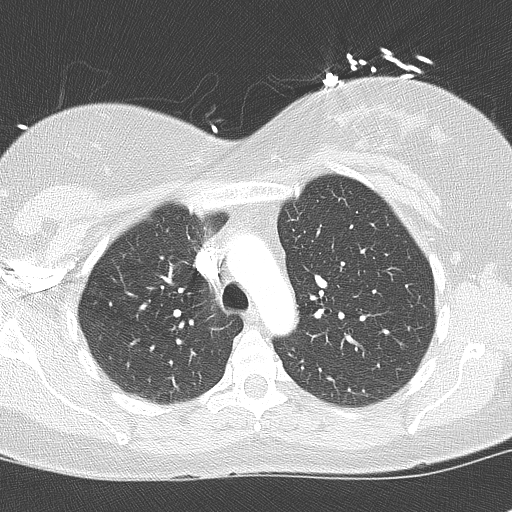
[im 74/103  soft-tissue]
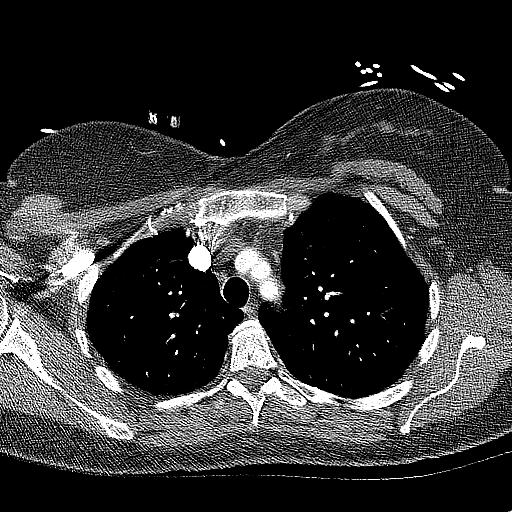
[im 80/103  lung]
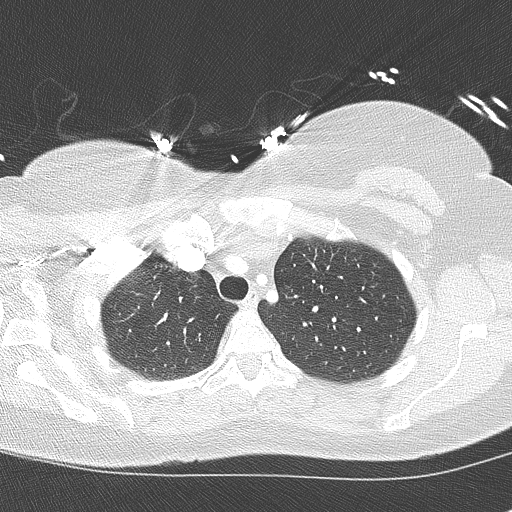
[im 91/103  soft-tissue]
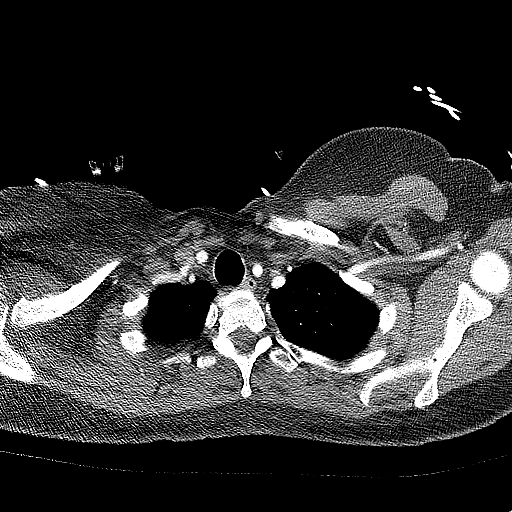
[im 97/103  lung]
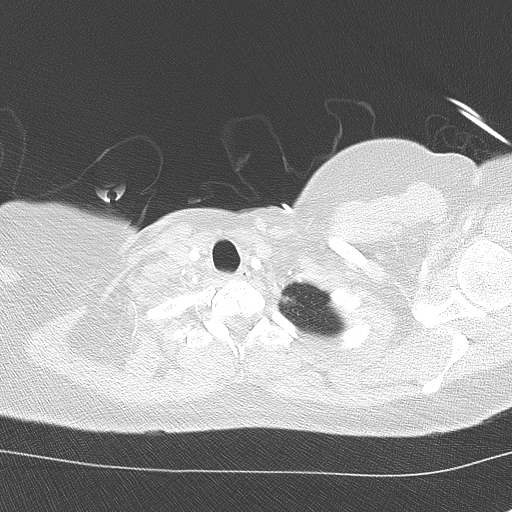

[Series 7: cor soft · coronal · 0.41mm/px · 3 of 117 slices shown]
[im 30/117  soft-tissue]
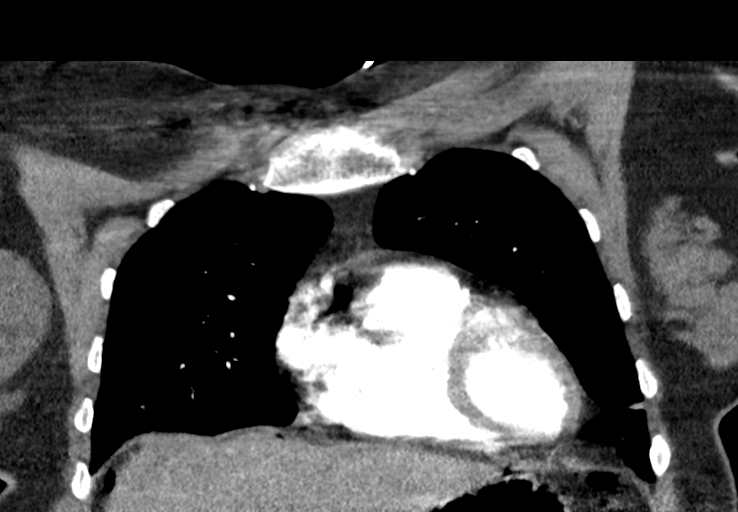
[im 59/117  soft-tissue]
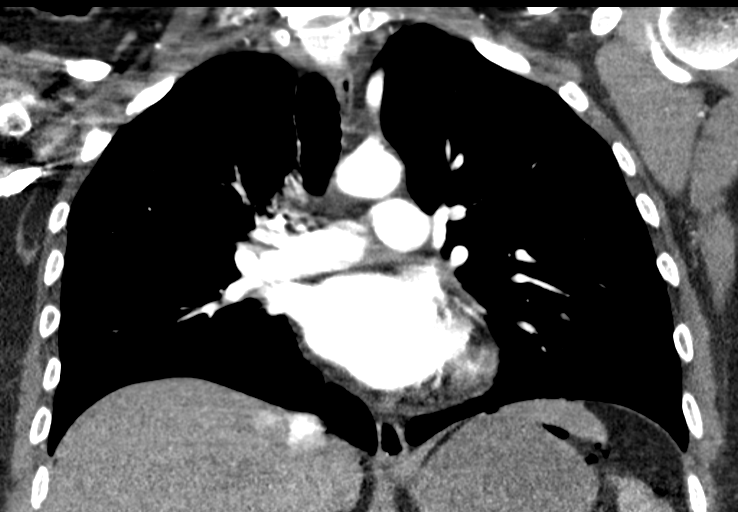
[im 88/117  soft-tissue]
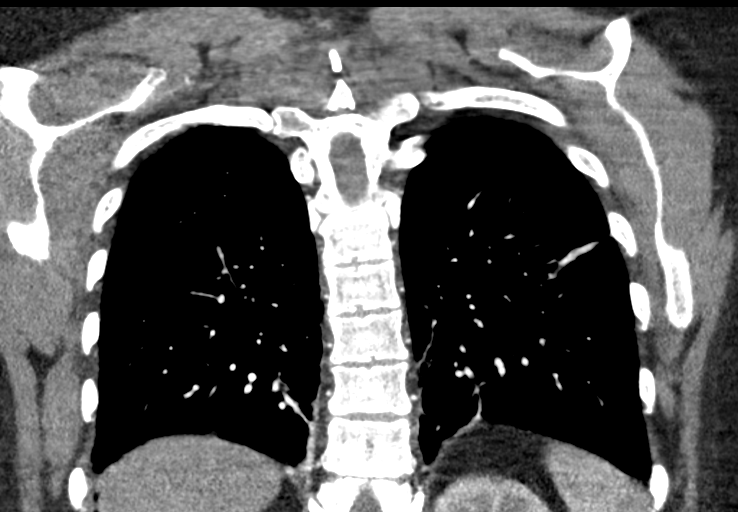

[16 of 46 positions shown; findings below may reference images not displayed]

RADIATION DOSE REDUCTION: This exam was performed according to the
departmental dose-optimization program which includes automated
exposure control, adjustment of the mA and/or kV according to
patient size and/or use of iterative reconstruction technique.

CONTRAST:  100mL OMNIPAQUE IOHEXOL 350 MG/ML SOLN
FINDINGS: CTA CHEST FINDINGS

Cardiovascular: There is no cardiomegaly or pericardial effusion.
The thoracic aorta is unremarkable. The the origins of the great
vessels of the aortic arch appear patent as visualized. No pulmonary
artery embolus identified.

Mediastinum/Nodes: No hilar or mediastinal adenopathy. The esophagus
is grossly unremarkable. No mediastinal fluid collection.

Lungs/Pleura: There are bibasilar linear atelectasis or scarring. No
focal consolidation, pleural effusion, or pneumothorax. The central
airways are patent.

Musculoskeletal: No acute osseous pathology.

Review of the MIP images confirms the above findings.

CT ABDOMEN and PELVIS FINDINGS

Moderate pneumoperitoneum.  Trace free fluid in the pelvis.

Hepatobiliary: The liver is unremarkable. There is sludge or small
stones within the gallbladder. No pericholecystic fluid or evidence
of acute cholecystitis by CT.

Pancreas: Unremarkable. No pancreatic ductal dilatation or
surrounding inflammatory changes.

Spleen: Normal in size without focal abnormality.

Adrenals/Urinary Tract: The adrenal glands unremarkable. The
kidneys, visualized ureters, and urinary bladder appear
unremarkable.

Stomach/Bowel: There is no bowel obstruction or active inflammation.
The appendix is normal.

Vascular/Lymphatic: Mild atherosclerotic calcification of the aorta.
The IVC is unremarkable. No portal venous gas. There is no
adenopathy.

Reproductive: The uterus is anteverted. The endometrium measures
approximately 1 cm in thickness. Small pocket of air within the
endometrium, likely related to recent procedure. Mild enhancement of
the endometrium, likely post procedural, likely reactive. The
ovaries are grossly unremarkable. No pelvic mass.

Other: Stranding of the periumbilical fat likely related to
laparoscopic port placement. No fluid collection.

Musculoskeletal: No acute or significant osseous findings.

Review of the MIP images confirms the above findings.
IMPRESSION: 1. No acute intrathoracic pathology. No pulmonary artery embolus
identified.
2. Moderate pneumoperitoneum, likely related to laparoscopic
surgery.
3. Small gallstones.  No evidence of acute cholecystitis by CT.
4. No bowel obstruction. Normal appendix.
5. Aortic Atherosclerosis ([id number].W).

These results were called by telephone at the time of interpretation
on 04/25/2022 at [DATE] to provider BLAIN JUMPER , who verbally
acknowledged these results.

## 2024-02-16 SURGERY — COLONOSCOPY
Anesthesia: Choice

## 2024-02-16 NOTE — Telephone Encounter (Signed)
 Pt unable to keep prep down for TCS scheduled for this morning with Dr.Ahmed. pt left voicemail and message received from Endo. Called pt but had to leave message to return call

## 2024-02-16 NOTE — Telephone Encounter (Signed)
 Pt returned call and has been rescheduled to 03/02/24 at 1pm. Pt would like to try pill prep. Pt room mate will come by and pick up prep. Will attach instructions to prep sample.

## 2024-02-17 ENCOUNTER — Telehealth: Payer: Self-pay | Admitting: Family Medicine

## 2024-02-17 ENCOUNTER — Other Ambulatory Visit: Payer: Self-pay

## 2024-02-17 MED ORDER — GABAPENTIN 300 MG PO CAPS: ORAL_CAPSULE | ORAL | 3 refills | Status: AC

## 2024-02-17 NOTE — Telephone Encounter (Signed)
 Per her last AVS Malachi Bonds said once she runs out of gabapentin she will call in the 300mg  every day. This has been sent and pt aware

## 2024-02-17 NOTE — Telephone Encounter (Signed)
 Copied from CRM [id number]. Topic: Clinical - Medication Question >> Feb 16, 2024  9:24 AM Carlatta H wrote: Reason for CRM: Patient has a question about gabapentin (NEURONTIN) 100 MG capsule [[id number]]//Patient discussed for the prescription dosage to go up//Please call to advise

## 2024-02-21 DIAGNOSIS — Z419 Encounter for procedure for purposes other than remedying health state, unspecified: Secondary | ICD-10-CM | POA: Diagnosis not present

## 2024-03-02 ENCOUNTER — Encounter (HOSPITAL_COMMUNITY): Payer: Self-pay | Admitting: Gastroenterology

## 2024-03-02 ENCOUNTER — Other Ambulatory Visit: Payer: Self-pay

## 2024-03-02 ENCOUNTER — Ambulatory Visit (HOSPITAL_COMMUNITY)
Admission: RE | Admit: 2024-03-02 | Discharge: 2024-03-02 | Disposition: A | Discharge status: Home / Self Care | Attending: Gastroenterology | Admitting: Gastroenterology

## 2024-03-02 ENCOUNTER — Encounter (HOSPITAL_COMMUNITY)
Admission: RE | Disposition: A | Discharge status: Home / Self Care | Payer: Self-pay | Source: Home / Self Care | Attending: Gastroenterology

## 2024-03-02 ENCOUNTER — Ambulatory Visit (HOSPITAL_COMMUNITY): Admitting: Certified Registered"

## 2024-03-02 ENCOUNTER — Ambulatory Visit (HOSPITAL_BASED_OUTPATIENT_CLINIC_OR_DEPARTMENT_OTHER): Admitting: Certified Registered"

## 2024-03-02 DIAGNOSIS — K5900 Constipation, unspecified: Secondary | ICD-10-CM | POA: Diagnosis not present

## 2024-03-02 DIAGNOSIS — I351 Nonrheumatic aortic (valve) insufficiency: Secondary | ICD-10-CM | POA: Diagnosis not present

## 2024-03-02 DIAGNOSIS — K648 Other hemorrhoids: Secondary | ICD-10-CM

## 2024-03-02 DIAGNOSIS — I10 Essential (primary) hypertension: Secondary | ICD-10-CM | POA: Diagnosis not present

## 2024-03-02 DIAGNOSIS — Z87891 Personal history of nicotine dependence: Secondary | ICD-10-CM | POA: Insufficient documentation

## 2024-03-02 DIAGNOSIS — K219 Gastro-esophageal reflux disease without esophagitis: Secondary | ICD-10-CM | POA: Diagnosis not present

## 2024-03-02 DIAGNOSIS — K529 Noninfective gastroenteritis and colitis, unspecified: Secondary | ICD-10-CM

## 2024-03-02 DIAGNOSIS — K6389 Other specified diseases of intestine: Secondary | ICD-10-CM | POA: Insufficient documentation

## 2024-03-02 HISTORY — PX: COLONOSCOPY: SHX5424

## 2024-03-02 LAB — HM COLONOSCOPY

## 2024-03-02 LAB — POCT PREGNANCY, URINE: Preg Test, Ur: NEGATIVE

## 2024-03-02 SURGERY — COLONOSCOPY
Anesthesia: General

## 2024-03-02 MED ORDER — PROPOFOL 500 MG/50ML IV EMUL
INTRAVENOUS | Status: DC | PRN
  Administered 2024-03-02 (×2): 20 mg via INTRAVENOUS
  Administered 2024-03-02: 150 ug/kg/min via INTRAVENOUS
  Administered 2024-03-02: 60 mg via INTRAVENOUS
  Administered 2024-03-02: 20 mg via INTRAVENOUS

## 2024-03-02 MED ORDER — LIDOCAINE 2% (20 MG/ML) 5 ML SYRINGE
INTRAMUSCULAR | Status: DC | PRN
  Administered 2024-03-02: 60 mg via INTRAVENOUS

## 2024-03-02 MED ORDER — LACTATED RINGERS IV SOLN: INTRAVENOUS | Status: DC

## 2024-03-02 NOTE — Anesthesia Preprocedure Evaluation (Signed)
 Anesthesia Evaluation  Patient identified by MRN, date of birth, ID band Patient awake    Reviewed: Allergy & Precautions, H&P , NPO status , Patient's Chart, lab work & pertinent test results, reviewed documented beta blocker date and time   Airway Mallampati: II  TM Distance: >3 FB Neck ROM: Full    Dental no notable dental hx. (+) Dental Advisory Given, Teeth Intact   Pulmonary pneumonia, resolved, Patient abstained from smoking., former smoker   Pulmonary exam normal breath sounds clear to auscultation       Cardiovascular Exercise Tolerance: Good hypertension, Normal cardiovascular exam+ Valvular Problems/Murmurs AI  Rhythm:regular Rate:Normal  Mitral valve disorder   Neuro/Psych  PSYCHIATRIC DISORDERS  Depression    negative neurological ROS     GI/Hepatic Neg liver ROS,GERD  Medicated,,  Endo/Other  negative endocrine ROS    Renal/GU negative Renal ROS  negative genitourinary   Musculoskeletal  (+) Arthritis , Osteoarthritis,    Abdominal   Peds  Hematology negative hematology ROS (+)   Anesthesia Other Findings   Reproductive/Obstetrics negative OB ROS                              Anesthesia Physical Anesthesia Plan  ASA: 2  Anesthesia Plan: General   Post-op Pain Management: Minimal or no pain anticipated   Induction:   PONV Risk Score and Plan: Propofol  infusion  Airway Management Planned: Nasal Cannula and Natural Airway  Additional Equipment: None  Intra-op Plan:   Post-operative Plan:   Informed Consent: I have reviewed the patients History and Physical, chart, labs and discussed the procedure including the risks, benefits and alternatives for the proposed anesthesia with the patient or authorized representative who has indicated his/her understanding and acceptance.     Dental Advisory Given  Plan Discussed with: CRNA  Anesthesia Plan Comments:           Anesthesia Quick Evaluation

## 2024-03-02 NOTE — H&P (Signed)
 Primary Care Physician:  Zarwolo, Gloria, FNP Primary Gastroenterologist:  Dr. Alita Irwin  Pre-Procedure History & Physical: HPI:  Kristina Delacruz is a 43 y.o. female with past medical history of arctic valve insufficiency, GERD, mitral valve insufficiency, anxiety, gallstone pancreatitis (s/p GB removal 11/2023) is here for colonoscopy given altered bowel movements .family history of colorectal cancer positive in grandfather.  No melena or hematochezia.  No abdominal pain or unintentional weight loss.  No change in bowel habits.  Overall feels well from a GI standpoint.  Past Medical History:  Diagnosis Date   Aortic valve insufficiency    Arthritis    GERD (gastroesophageal reflux disease)    Mitral valve disorder    Stress fracture of cervical vertebra    Trichimoniasis 08/13/2021   08/13/21 treated with flagyl .    Past Surgical History:  Procedure Laterality Date   DILATATION AND CURETTAGE/HYSTEROSCOPY WITH MINERVA N/A 04/24/2022   Procedure: DILATATION AND CURETTAGE/HYSTEROSCOPY WITH MINERVA;  Surgeon: Wendelyn Halter, MD;  Location: [facility];  Service: Gynecology;  Laterality: N/A;   ESSURE TUBAL LIGATION Bilateral    GALLBLADDER SURGERY  11/25/2023   LAPAROSCOPIC BILATERAL SALPINGECTOMY N/A 04/24/2022   Procedure: LAPAROSCOPIC BILATERAL SALPINGECTOMY; removal foreign object;  Surgeon: Wendelyn Halter, MD;  Location: [facility];  Service: Gynecology;  Laterality: N/A;   TUBAL LIGATION      Prior to Admission medications   Medication Sig Start Date End Date Taking? Authorizing Provider  Bacillus Coagulans-Inulin (PROBIOTIC-PREBIOTIC PO) Take 1 capsule by mouth daily.   Yes [provider]  Cetirizine  HCl (ZYRTEC  PO) Take 1 tablet by mouth daily.   Yes [provider]  Drospirenone  (SLYND ) 4 MG TABS Take 1 tablet (4 mg total) by mouth daily. 12/16/23  Yes Javan Messing, NP  gabapentin  (NEURONTIN ) 300 MG capsule 1 capsule daily 02/17/24  Yes Zarwolo, Gloria, FNP  metoprolol   tartrate (LOPRESSOR ) 25 MG tablet Take 1 tablet (25 mg total) by mouth 2 (two) times daily. 12/12/23 03/11/24 Yes Mallipeddi, Vishnu P, MD  montelukast  (SINGULAIR ) 10 MG tablet TAKE 1 TABLET BY MOUTH EVERYDAY AT BEDTIME 12/22/23  Yes Zarwolo, Gloria, FNP  OVER THE COUNTER MEDICATION Biotin one at bedtime.   Yes [provider]  oxyCODONE -acetaminophen  (PERCOCET/ROXICET) 5-325 MG tablet Take 1 tablet by mouth daily as needed for moderate pain (pain score 4-6). 10/03/23  Yes [provider]  pantoprazole  (PROTONIX ) 40 MG tablet TAKE 1 TABLET BY MOUTH TWICE A DAY 12/22/23  Yes Zarwolo, Gloria, FNP  RESTASIS  0.05 % ophthalmic emulsion Place 1 drop into both eyes 2 (two) times daily. 10/17/23  Yes [provider]  traZODone  (DESYREL ) 150 MG tablet TAKE 1 TABLET BY MOUTH EVERYDAY AT BEDTIME 10/17/23  Yes Zarwolo, Gloria, FNP  venlafaxine  XR (EFFEXOR -XR) 75 MG 24 hr capsule TAKE 1 CAPSULE BY MOUTH DAILY WITH BREAKFAST. 11/13/23  Yes Lendia Quay A, NP  cyclobenzaprine  (FLEXERIL ) 5 MG tablet Take 5 mg by mouth daily. 09/05/23   [provider]  lubiprostone  (AMITIZA ) 24 MCG capsule TAKE 1 CAPSULE (24 MCG TOTAL) BY MOUTH 2 (TWO) TIMES DAILY WITH A MEAL. 02/16/24   Carlan, Chelsea L, NP  polyethylene glycol-electrolytes (NULYTELY ) 420 g solution Take 4,000 mLs by mouth once. Patient not taking: Reported on 02/10/2024 01/21/24   [provider]  diphenhydrAMINE  (BENADRYL ) 25 MG tablet Take 25 mg by mouth every 6 (six) hours as needed.  05/14/13  [provider]    Allergies as of 03/01/2024 - Review Complete 02/10/2024  Allergen Reaction Noted   Bee venom Swelling 08/08/2021   Fentanyl  Nausea And Vomiting 11/22/2023   Onion Rash 08/08/2021   Tape Rash 12/23/2022    Family History  Problem Relation Age of Onset   Thyroid disease Mother    Hypertension Father        AVR   Heart murmur Father    Hypercholesterolemia Sister    Thyroid disease Sister     Diabetes type I Daughter    Thyroid disease Paternal Aunt    Thyroid disease Paternal Uncle    Hypertension Maternal Grandmother    Colon cancer Paternal Grandfather    Breast cancer Neg Hx    Cervical cancer Neg Hx     Social History   Socioeconomic History   Marital status: Single    Spouse name: Not on file   Number of children: 1   Years of education: Not on file   Highest education level: Not on file  Occupational History   Not on file  Tobacco Use   Smoking status: Former    Types: Cigarettes   Smokeless tobacco: Never  Vaping Use   Vaping status: Never Used  Substance and Sexual Activity   Alcohol use: Not Currently   Drug use: No   Sexual activity: Not Currently    Birth control/protection: Surgical    Comment: tubal & ablation  Other Topics Concern   Not on file  Social History Narrative   Lives home alone   Social Drivers of Health   Financial Resource Strain: Low Risk  (08/08/2021)   Overall Financial Resource Strain (CARDIA)    Difficulty of Paying Living Expenses: Not very hard  Food Insecurity: No Food Insecurity (11/22/2023)   Hunger Vital Sign    Worried About Running Out of Food in the Last Year: Never true    Ran Out of Food in the Last Year: Never true  Transportation Needs: No Transportation Needs (11/22/2023)   PRAPARE - Administrator, Civil Service (Medical): No    Lack of Transportation (Non-Medical): No  Physical Activity: Sufficiently Active (08/08/2021)   Exercise Vital Sign    Days of Exercise per Week: 6 days    Minutes of Exercise per Session: 30 min  Stress: No Stress Concern Present (08/08/2021)   [employer] of Occupational Health - Occupational Stress Questionnaire    Feeling of Stress : Only a little  Social Connections: Moderately Integrated (11/22/2023)   Social Connection and Isolation Panel [NHANES]    Frequency of Communication with Friends and Family: More than three times a week    Frequency of Social  Gatherings with Friends and Family: Once a week    Attends Religious Services: More than 4 times per year    Active Member of [employer] or Organizations: No    Attends Banker Meetings: Patient declined    Marital Status: Living with partner  Intimate Partner Violence: Unknown (11/25/2023)   Humiliation, Afraid, Rape, and Kick questionnaire    Fear of Current or Ex-Partner: No    Emotionally Abused: No    Physically Abused: No    Sexually Abused: Not on file    Review of Systems: See HPI, otherwise negative ROS  Physical Exam: Vital signs in last 24 hours: Temp:  [97.8 F (36.6 C)] 97.8 F (36.6 C) (04/22 1131) Pulse Rate:  [76] 76 (04/22 1131) Resp:  [17] 17 (04/22 1131) BP: (129)/(84) 129/84 (04/22 1131) SpO2:  [97 %] 97 % (04/22 1131) Weight:  [  79.4 kg] 79.4 kg (04/22 1131)   General:   Alert,  Well-developed, well-nourished, pleasant and cooperative in NAD Head:  Normocephalic and atraumatic. Eyes:  Sclera clear, no icterus.   Conjunctiva pink. Ears:  Normal auditory acuity. Nose:  No deformity, discharge,  or lesions. Msk:  Symmetrical without gross deformities. Normal posture. Extremities:  Without clubbing or edema. Neurologic:  Alert and  oriented x4;  grossly normal neurologically. Skin:  Intact without significant lesions or rashes. Psych:  Alert and cooperative. Normal mood and affect.  Impression/Plan:   Kristina Delacruz is a 43 y.o. female with past medical history of arctic valve insufficiency, GERD, mitral valve insufficiency, anxiety, gallstone pancreatitis (s/p GB removal 11/2023) is here for colonoscopy given altered bowel movements .  The risks of the procedure including infection, bleed, or perforation as well as benefits, limitations, alternatives and imponderables have been reviewed with the patient. Questions have been answered. All parties agreeable.

## 2024-03-02 NOTE — Anesthesia Postprocedure Evaluation (Signed)
 Anesthesia Post Note  Patient: Kristina Delacruz  Procedure(s) Performed: COLONOSCOPY  Patient location during evaluation: Phase II Anesthesia Type: General Level of consciousness: awake Pain management: pain level controlled Vital Signs Assessment: post-procedure vital signs reviewed and stable Respiratory status: spontaneous breathing and respiratory function stable Cardiovascular status: blood pressure returned to baseline and stable Postop Assessment: no headache and no apparent nausea or vomiting Anesthetic complications: no Comments: Late entry   No notable events documented.   Last Vitals:  Vitals:   03/02/24 1221 03/02/24 1228  BP: 97/64 100/71  Pulse: 84 86  Resp: 16 18  Temp: 36.4 C   SpO2: 97% 100%    Last Pain:  Vitals:   03/02/24 1228  TempSrc:   PainSc: 0-No pain                 Coretha Dew

## 2024-03-02 NOTE — Discharge Instructions (Signed)

## 2024-03-02 NOTE — Transfer of Care (Signed)
 Immediate Anesthesia Transfer of Care Note  Patient: Kristina Delacruz  Procedure(s) Performed: COLONOSCOPY  Patient Location: PACU and Short Stay  Anesthesia Type:General  Level of Consciousness: awake, alert , and oriented  Airway & Oxygen Therapy: Patient Spontanous Breathing  Post-op Assessment: Report given to RN and Post -op Vital signs reviewed and stable  Post vital signs: Reviewed and stable  Last Vitals:  Vitals Value Taken Time  BP 97/64 03/02/24 1221  Temp 36.4 C 03/02/24 1221  Pulse 84 03/02/24 1221  Resp 16 03/02/24 1221  SpO2 97 % 03/02/24 1221    Last Pain:  Vitals:   03/02/24 1221  TempSrc: Oral  PainSc:       Patients Stated Pain Goal: 10 (03/02/24 1131)  Complications: No notable events documented.

## 2024-03-02 NOTE — Op Note (Signed)
 [facility] Patient Name: Kristina Delacruz Procedure Date: 03/02/2024 11:09 AM MRN: [id number] Date of Birth: 11/06/1981 Attending MD: Terril Fetters , MD, [id number] CSN: [id number] Age: 43 Admit Type: Inpatient Procedure:                Colonoscopy Indications:              Change in bowel habits, Constipation Providers:                Terril Fetters, MD, Crystal Page, Debi Fall., Technician Referring MD:              Medicines:                Monitored Anesthesia Care Complications:            No immediate complications. Estimated Blood Loss:     Estimated blood loss was minimal. Procedure:                Pre-Anesthesia Assessment:                           - Prior to the procedure, a History and Physical                            was performed, and patient medications and                            allergies were reviewed. The patient's tolerance of                            previous anesthesia was also reviewed. The risks                            and benefits of the procedure and the sedation                            options and risks were discussed with the patient.                            All questions were answered, and informed consent                            was obtained. Prior Anticoagulants: The patient has                            taken no anticoagulant or antiplatelet agents. ASA                            Grade Assessment: II - A patient with mild systemic                            disease. After reviewing the risks and benefits,  the patient was deemed in satisfactory condition to                            undergo the procedure.                           After obtaining informed consent, the colonoscope                            was passed under direct vision. Throughout the                            procedure, the patient's blood pressure, pulse, and                            oxygen  saturations were monitored continuously. The                            [id number]) scope was introduced through the                            anus and advanced to the the terminal ileum. The                            colonoscopy was performed without difficulty. The                            patient tolerated the procedure well. The quality                            of the bowel preparation was evaluated using the                            BBPS [facility] Bowel Preparation Scale) with scores                            of: Right Colon = 3, Transverse Colon = 3 and Left                            Colon = 3 (entire mucosa seen well with no residual                            staining, small fragments of stool or opaque                            liquid). The total BBPS score equals 9. The                            terminal ileum, ileocecal valve, appendiceal                            orifice, and rectum were photographed. Scope In: 12:03:21 PM Scope Out: 12:20:01 PM Scope Withdrawal Time: 0 hours 13 minutes  29 seconds  Total Procedure Duration: 0 hours 16 minutes 40 seconds  Findings:      The perianal and digital rectal examinations were normal.      The terminal ileum appeared normal.      Exudate was found in the entire colon. Biopsies were taken with a cold       forceps for histology. Biopsies were obtained in the rectum, in the       sigmoid colon, in the descending colon, in the transverse colon and in       the ascending colon with cold forceps for histology.      Non-bleeding internal hemorrhoids were found during retroflexion. The       hemorrhoids were small. Impression:               - The examined portion of the ileum was normal.                           - Exudate in the entire examined colon.                            Biopsied.r/o IBD                           - Non-bleeding internal hemorrhoids.                           - Biopsies were obtained in the rectum,  in the                            sigmoid colon, in the descending colon, in the                            transverse colon and in the ascending colon. Moderate Sedation:      Per Anesthesia Care Recommendation:           - Patient has a contact number available for                            emergencies. The signs and symptoms of potential                            delayed complications were discussed with the                            patient. Return to normal activities tomorrow.                            Written discharge instructions were provided to the                            patient.                           - High fiber diet.                           - Continue present medications.                           -  Await pathology results.                           - Repeat colonoscopy in 10 years for surveillance                            based on pathology results.                           - Return to GI clinic as previously scheduled.                           -Questionable complaince to [facility]  or [facility]  , Procedure Code(s):        --- Professional ---                           [phone], Colonoscopy, flexible; with biopsy, single                            or multiple Diagnosis Code(s):        --- Professional ---                           K64.8, Other hemorrhoids                           K63.89, Other specified diseases of intestine                           R19.4, Change in bowel habit                           K59.00, Constipation, unspecified CPT copyright 2022 American Medical Association. All rights reserved. The codes documented in this report are preliminary and upon coder review may  be revised to meet current compliance requirements. Terril Fetters, MD Terril Fetters, MD 03/02/2024 12:29:58 PM This report has been signed electronically. Number of Addenda: 0

## 2024-03-03 ENCOUNTER — Encounter (INDEPENDENT_AMBULATORY_CARE_PROVIDER_SITE_OTHER): Payer: Self-pay | Admitting: *Deleted

## 2024-03-03 ENCOUNTER — Encounter (HOSPITAL_COMMUNITY): Payer: Self-pay | Admitting: Gastroenterology

## 2024-03-03 LAB — SURGICAL PATHOLOGY

## 2024-03-08 ENCOUNTER — Encounter (INDEPENDENT_AMBULATORY_CARE_PROVIDER_SITE_OTHER): Payer: Self-pay | Admitting: *Deleted

## 2024-03-22 DIAGNOSIS — Z419 Encounter for procedure for purposes other than remedying health state, unspecified: Secondary | ICD-10-CM | POA: Diagnosis not present

## 2024-04-09 ENCOUNTER — Telehealth (INDEPENDENT_AMBULATORY_CARE_PROVIDER_SITE_OTHER): Payer: Self-pay | Admitting: Gastroenterology

## 2024-04-09 NOTE — Telephone Encounter (Signed)
 Retro authorization submitted via [employer]. Used CPT [id number] for TCS with biopsy.  Submission Number [id number]

## 2024-04-11 ENCOUNTER — Other Ambulatory Visit: Payer: Self-pay | Admitting: Adult Health

## 2024-04-22 DIAGNOSIS — Z419 Encounter for procedure for purposes other than remedying health state, unspecified: Secondary | ICD-10-CM | POA: Diagnosis not present

## 2024-04-26 ENCOUNTER — Encounter: Payer: Self-pay | Admitting: Family Medicine

## 2024-04-26 ENCOUNTER — Ambulatory Visit (INDEPENDENT_AMBULATORY_CARE_PROVIDER_SITE_OTHER): Payer: 59 | Admitting: Family Medicine

## 2024-04-26 VITALS — BP 133/85 | HR 78 | Resp 16 | Ht 62.0 in | Wt 179.0 lb

## 2024-04-26 DIAGNOSIS — E559 Vitamin D deficiency, unspecified: Secondary | ICD-10-CM | POA: Diagnosis not present

## 2024-04-26 DIAGNOSIS — R7301 Impaired fasting glucose: Secondary | ICD-10-CM

## 2024-04-26 DIAGNOSIS — M5440 Lumbago with sciatica, unspecified side: Secondary | ICD-10-CM

## 2024-04-26 DIAGNOSIS — E038 Other specified hypothyroidism: Secondary | ICD-10-CM

## 2024-04-26 DIAGNOSIS — E7849 Other hyperlipidemia: Secondary | ICD-10-CM

## 2024-04-26 MED ORDER — CYCLOBENZAPRINE HCL 5 MG PO TABS
5.0000 mg | ORAL_TABLET | Freq: Every day | ORAL | 1 refills | Status: AC
Start: 2024-04-26 — End: ?

## 2024-04-26 NOTE — Progress Notes (Signed)
 Established Patient Office Visit  Subjective:  Patient ID: Kristina Delacruz, female    DOB: [birth year]  Age: 43 y.o. MRN: [id number]  CC:  Chief Complaint  Patient presents with   Pain Management    States she is having issues with pain management and have picked up more hours at work and her leg pain is worse and she said the pain meds help but the muscle relaxer helps the most. Is wanting that refilled     HPI Kristina Delacruz is a 43 y.o. female with the above complaints. She rates her pain as 4 out of 10, which worsens with ambulation, walking  up and down stairs, and increased activity, and is relieved with rest.  She reports lumbar pain radiating to the left lower extremity, especially with increased activity. The patient states that her muscle relaxer has been more effective than pain medication and is requesting a refill today.  She denies bowel or bladder incontinence at this time.   Past Medical History:  Diagnosis Date   Aortic valve insufficiency    Arthritis    GERD (gastroesophageal reflux disease)    Mitral valve disorder    Stress fracture of cervical vertebra    Trichimoniasis 08/13/2021   08/13/21 treated with flagyl .    Past Surgical History:  Procedure Laterality Date   COLONOSCOPY N/A 03/02/2024   Procedure: COLONOSCOPY;  Surgeon: Hargis Lias, MD;  [facility];  Service: Endoscopy;  Laterality: N/A;  1:00PM;ASA 2   DILATATION AND CURETTAGE/HYSTEROSCOPY WITH MINERVA N/A 04/24/2022   Procedure: DILATATION AND CURETTAGE/HYSTEROSCOPY WITH MINERVA;  Surgeon: Wendelyn Halter, MD;  Location: [facility];  Service: Gynecology;  Laterality: N/A;   ESSURE TUBAL LIGATION Bilateral    GALLBLADDER SURGERY  11/25/2023   LAPAROSCOPIC BILATERAL SALPINGECTOMY N/A 04/24/2022   Procedure: LAPAROSCOPIC BILATERAL SALPINGECTOMY; removal foreign object;  Surgeon: Wendelyn Halter, MD;  Location: [facility];  Service: Gynecology;  Laterality: N/A;   TUBAL LIGATION       Family History  Problem Relation Age of Onset   Thyroid disease Mother    Hypertension Father        AVR   Heart murmur Father    Hypercholesterolemia Sister    Thyroid disease Sister    Diabetes type I Daughter    Thyroid disease Paternal Aunt    Thyroid disease Paternal Uncle    Hypertension Maternal Grandmother    Colon cancer Paternal Grandfather    Breast cancer Neg Hx    Cervical cancer Neg Hx     Social History   Socioeconomic History   Marital status: Single    Spouse name: Not on file   Number of children: 1   Years of education: Not on file   Highest education level: Not on file  Occupational History   Not on file  Tobacco Use   Smoking status: Former    Types: Cigarettes   Smokeless tobacco: Never  Vaping Use   Vaping status: Never Used  Substance and Sexual Activity   Alcohol use: Not Currently   Drug use: No   Sexual activity: Not Currently    Birth control/protection: Surgical    Comment: tubal & ablation  Other Topics Concern   Not on file  Social History Narrative   Lives home alone   Social Drivers of Health   Financial Resource Strain: Low Risk  (08/08/2021)   Overall Financial Resource Strain (CARDIA)    Difficulty of Paying Living Expenses: Not very hard  Food Insecurity: No Food Insecurity (11/22/2023)   Hunger Vital Sign    Worried About Running Out of Food in the Last Year: Never true    Ran Out of Food in the Last Year: Never true  Transportation Needs: No Transportation Needs (11/22/2023)   PRAPARE - Administrator, Civil Service (Medical): No    Lack of Transportation (Non-Medical): No  Physical Activity: Sufficiently Active (08/08/2021)   Exercise Vital Sign    Days of Exercise per Week: 6 days    Minutes of Exercise per Session: 30 min  Stress: No Stress Concern Present (08/08/2021)   [employer] of Occupational Health - Occupational Stress Questionnaire    Feeling of Stress : Only a little  Social  Connections: Moderately Integrated (11/22/2023)   Social Connection and Isolation Panel    Frequency of Communication with Friends and Family: More than three times a week    Frequency of Social Gatherings with Friends and Family: Once a week    Attends Religious Services: More than 4 times per year    Active Member of [employer] or Organizations: No    Attends Banker Meetings: Patient declined    Marital Status: Living with partner  Intimate Partner Violence: Unknown (11/25/2023)   Humiliation, Afraid, Rape, and Kick questionnaire    Fear of Current or Ex-Partner: No    Emotionally Abused: No    Physically Abused: No    Sexually Abused: Not on file    Outpatient Medications Prior to Visit  Medication Sig Dispense Refill   Bacillus Coagulans-Inulin (PROBIOTIC-PREBIOTIC PO) Take 1 capsule by mouth daily.     Cetirizine  HCl (ZYRTEC  PO) Take 1 tablet by mouth daily.     Drospirenone  (SLYND ) 4 MG TABS Take 1 tablet (4 mg total) by mouth daily. 28 tablet 12   gabapentin  (NEURONTIN ) 300 MG capsule 1 capsule daily 90 capsule 3   lubiprostone  (AMITIZA ) 24 MCG capsule TAKE 1 CAPSULE (24 MCG TOTAL) BY MOUTH 2 (TWO) TIMES DAILY WITH A MEAL. 180 capsule 1   metoprolol  tartrate (LOPRESSOR ) 25 MG tablet Take 1 tablet (25 mg total) by mouth 2 (two) times daily. 180 tablet 3   montelukast  (SINGULAIR ) 10 MG tablet TAKE 1 TABLET BY MOUTH EVERYDAY AT BEDTIME 90 tablet 2   OVER THE COUNTER MEDICATION Biotin one at bedtime.     pantoprazole  (PROTONIX ) 40 MG tablet TAKE 1 TABLET BY MOUTH TWICE A DAY 180 tablet 2   polyethylene glycol-electrolytes (NULYTELY ) 420 g solution Take 4,000 mLs by mouth once.     RESTASIS  0.05 % ophthalmic emulsion Place 1 drop into both eyes 2 (two) times daily.     traZODone  (DESYREL ) 150 MG tablet TAKE 1 TABLET BY MOUTH EVERYDAY AT BEDTIME 90 tablet 3   venlafaxine  XR (EFFEXOR -XR) 75 MG 24 hr capsule TAKE 1 CAPSULE BY MOUTH DAILY WITH BREAKFAST. 90 capsule 3    oxyCODONE -acetaminophen  (PERCOCET/ROXICET) 5-325 MG tablet Take 1 tablet by mouth daily as needed for moderate pain (pain score 4-6). (Patient not taking: Reported on 04/26/2024)     cyclobenzaprine  (FLEXERIL ) 5 MG tablet Take 5 mg by mouth daily. (Patient not taking: Reported on 04/26/2024)     No facility-administered medications prior to visit.    Allergies  Allergen Reactions   Bee Venom Swelling   Fentanyl  Nausea And Vomiting    Prefers morphine  if need be   Onion Rash   Tape Rash    ROS Review of Systems  Constitutional:  Negative  for chills and fever.  Eyes:  Negative for visual disturbance.  Respiratory:  Negative for chest tightness and shortness of breath.   Musculoskeletal:  Positive for back pain.  Neurological:  Negative for dizziness and headaches.      Objective:    Physical Exam HENT:     Head: Normocephalic.     Mouth/Throat:     Mouth: Mucous membranes are moist.   Cardiovascular:     Rate and Rhythm: Normal rate.     Heart sounds: Normal heart sounds.  Pulmonary:     Effort: Pulmonary effort is normal.     Breath sounds: Normal breath sounds.   Musculoskeletal:        General: No swelling or deformity.     Lumbar back: Tenderness present.   Neurological:     Mental Status: She is alert.     BP 133/85   Pulse 78   Resp 16   Ht 5' 2 (1.575 m)   Wt 179 lb (81.2 kg)   SpO2 97%   BMI 32.74 kg/m  Wt Readings from Last 3 Encounters:  04/26/24 179 lb (81.2 kg)  03/02/24 175 lb (79.4 kg)  02/10/24 178 lb 8 oz (81 kg)    Lab Results  Component Value Date   TSH 3.270 08/26/2023   Lab Results  Component Value Date   WBC 10.0 11/26/2023   HGB 12.0 11/26/2023   HCT 36.6 11/26/2023   MCV 90.1 11/26/2023   PLT 255 11/26/2023   Lab Results  Component Value Date   NA 142 12/09/2023   K 4.3 12/09/2023   CO2 19 (L) 12/09/2023   GLUCOSE 73 12/09/2023   BUN 9 12/09/2023   CREATININE 0.78 12/09/2023   BILITOT 0.3 12/09/2023   ALKPHOS 79  12/09/2023   AST 14 12/09/2023   ALT 22 12/09/2023   PROT 7.3 12/09/2023   ALBUMIN 4.2 12/09/2023   CALCIUM 9.6 12/09/2023   ANIONGAP 10 11/26/2023   EGFR 97 12/09/2023   Lab Results  Component Value Date   CHOL 207 (H) 11/22/2023   Lab Results  Component Value Date   HDL 61 11/22/2023   Lab Results  Component Value Date   LDLCALC 131 (H) 11/22/2023   Lab Results  Component Value Date   TRIG 61 11/23/2023   Lab Results  Component Value Date   CHOLHDL 3.4 11/22/2023   Lab Results  Component Value Date   HGBA1C 5.2 08/26/2023      Assessment & Plan:  Back pain of lumbar region with sciatica Assessment & Plan: Flexeril  has been refilled. Nonpharmacological interventions were discussed, and the patient verbalized understanding.   Orders: -     Cyclobenzaprine  HCl; Take 1 tablet (5 mg total) by mouth daily.  Dispense: 90 tablet; Refill: 1  IFG (impaired fasting glucose) -     Hemoglobin A1c  Vitamin D  deficiency -     VITAMIN D  25 Hydroxy (Vit-D Deficiency, Fractures)  TSH (thyroid-stimulating hormone deficiency) -     TSH + free T4  Other hyperlipidemia -     Lipid panel -     CMP14+EGFR -     CBC with Differential/Platelet  Note: This chart has been completed using Engineer, civil (consulting) software, and while attempts have been made to ensure accuracy, certain words and phrases may not be transcribed as intended.    Follow-up: Return in about 4 months (around 08/26/2024).   Kinsey Karch, FNP

## 2024-04-26 NOTE — Patient Instructions (Signed)
 I appreciate the opportunity to provide care to you today!    Follow up:  4 months  Labs: please stop by the lab during the week to get your blood drawn (CBC, CMP, TSH, Lipid profile, HgA1c, Vit D)  For a Healthier YOU, I Recommend: Reducing your intake of sugar, sodium, carbohydrates, and saturated fats. Increasing your fiber intake by incorporating more whole grains, fruits, and vegetables into your meals. Setting healthy goals with a focus on lowering your consumption of carbs, sugar, and unhealthy fats. Adding variety to your diet by including a wide range of fruits and vegetables. Cutting back on soda and limiting processed foods as much as possible. Staying active: In addition to taking your weight loss medication, aim for at least 150 minutes of moderate-intensity physical activity each week for optimal results.      Please continue to a heart-healthy diet and increase your physical activities. Try to exercise for at least five days a week.    It was a pleasure to see you and I look forward to continuing to work together on your health and well-being. Please do not hesitate to call the office if you need care or have questions about your care.  In case of emergency, please visit the Emergency Department for urgent care, or contact our clinic at [phone] to schedule an appointment. We're here to help you!   Have a wonderful day and week. With Gratitude, Doyl Bitting MSN, FNP-BC

## 2024-04-26 NOTE — Assessment & Plan Note (Signed)
 Flexeril  has been refilled. Nonpharmacological interventions were discussed, and the patient verbalized understanding.

## 2024-04-28 DIAGNOSIS — E7849 Other hyperlipidemia: Secondary | ICD-10-CM | POA: Diagnosis not present

## 2024-04-28 DIAGNOSIS — E038 Other specified hypothyroidism: Secondary | ICD-10-CM | POA: Diagnosis not present

## 2024-04-28 DIAGNOSIS — E559 Vitamin D deficiency, unspecified: Secondary | ICD-10-CM | POA: Diagnosis not present

## 2024-04-28 DIAGNOSIS — R7301 Impaired fasting glucose: Secondary | ICD-10-CM | POA: Diagnosis not present

## 2024-04-29 LAB — HEMOGLOBIN A1C
Est. average glucose Bld gHb Est-mCnc: 105 mg/dL
Hgb A1c MFr Bld: 5.3 % (ref 4.8–5.6)

## 2024-04-29 LAB — CBC WITH DIFFERENTIAL/PLATELET
Basophils Absolute: 0.1 10*3/uL (ref 0.0–0.2)
Basos: 1 %
EOS (ABSOLUTE): 0.1 10*3/uL (ref 0.0–0.4)
Eos: 3 %
Hematocrit: 40.2 % (ref 34.0–46.6)
Hemoglobin: 13 g/dL (ref 11.1–15.9)
Immature Grans (Abs): 0 10*3/uL (ref 0.0–0.1)
Immature Granulocytes: 0 %
Lymphocytes Absolute: 1.7 10*3/uL (ref 0.7–3.1)
Lymphs: 36 %
MCH: 29 pg (ref 26.6–33.0)
MCHC: 32.3 g/dL (ref 31.5–35.7)
MCV: 90 fL (ref 79–97)
Monocytes Absolute: 0.4 10*3/uL (ref 0.1–0.9)
Monocytes: 9 %
Neutrophils Absolute: 2.3 10*3/uL (ref 1.4–7.0)
Neutrophils: 51 %
Platelets: 279 10*3/uL (ref 150–450)
RBC: 4.48 x10E6/uL (ref 3.77–5.28)
RDW: 13.7 % (ref 11.7–15.4)
WBC: 4.6 10*3/uL (ref 3.4–10.8)

## 2024-04-29 LAB — LIPID PANEL
Chol/HDL Ratio: 4.6 ratio — ABNORMAL HIGH (ref 0.0–4.4)
Cholesterol, Total: 202 mg/dL — ABNORMAL HIGH (ref 100–199)
HDL: 44 mg/dL (ref >39)
LDL Chol Calc (NIH): 139 mg/dL — ABNORMAL HIGH (ref 0–99)
Triglycerides: 105 mg/dL (ref 0–149)
VLDL Cholesterol Cal: 19 mg/dL (ref 5–40)

## 2024-04-29 LAB — CMP14+EGFR
ALT: 11 IU/L (ref 0–32)
AST: 13 IU/L (ref 0–40)
Albumin: 4.1 g/dL (ref 3.9–4.9)
Alkaline Phosphatase: 73 IU/L (ref 44–121)
BUN/Creatinine Ratio: 12 (ref 9–23)
BUN: 9 mg/dL (ref 6–24)
Bilirubin Total: 0.4 mg/dL (ref 0.0–1.2)
CO2: 19 mmol/L — ABNORMAL LOW (ref 20–29)
Calcium: 9 mg/dL (ref 8.7–10.2)
Chloride: 107 mmol/L — ABNORMAL HIGH (ref 96–106)
Creatinine, Ser: 0.77 mg/dL (ref 0.57–1.00)
Globulin, Total: 2.7 g/dL (ref 1.5–4.5)
Glucose: 93 mg/dL (ref 70–99)
Potassium: 3.9 mmol/L (ref 3.5–5.2)
Sodium: 141 mmol/L (ref 134–144)
Total Protein: 6.8 g/dL (ref 6.0–8.5)
eGFR: 99 mL/min/{1.73_m2} (ref >59)

## 2024-04-29 LAB — VITAMIN D 25 HYDROXY (VIT D DEFICIENCY, FRACTURES): Vit D, 25-Hydroxy: 34.3 ng/mL (ref 30.0–100.0)

## 2024-04-29 LAB — TSH+FREE T4
Free T4: 1.12 ng/dL (ref 0.82–1.77)
TSH: 5.29 u[IU]/mL — ABNORMAL HIGH (ref 0.450–4.500)

## 2024-05-03 ENCOUNTER — Ambulatory Visit: Payer: Self-pay | Admitting: Family Medicine

## 2024-05-11 ENCOUNTER — Encounter (INDEPENDENT_AMBULATORY_CARE_PROVIDER_SITE_OTHER): Payer: Self-pay | Admitting: Gastroenterology

## 2024-05-11 ENCOUNTER — Ambulatory Visit (INDEPENDENT_AMBULATORY_CARE_PROVIDER_SITE_OTHER): Admitting: Gastroenterology

## 2024-05-11 VITALS — BP 137/86 | HR 80 | Temp 97.1°F | Ht 62.0 in | Wt 180.2 lb

## 2024-05-11 DIAGNOSIS — K5900 Constipation, unspecified: Secondary | ICD-10-CM | POA: Diagnosis not present

## 2024-05-11 NOTE — Patient Instructions (Signed)
 Continue good water  intake  Increase fruits, veggies and whole grains, kiwi and prunes are especially good for constipation Please try taking amtiza 24mcg atleast once daily as you are current taking it only a fraction of the recommended dose, let me know how you do with this  Follow up 6 months  It was a pleasure to see you today. I want to create trusting relationships with patients and provide genuine, compassionate, and quality care. I truly value your feedback! please be on the lookout for a survey regarding your visit with me today. I appreciate your input about our visit and your time in completing this!    Hulbert Branscome L. Gryphon Vanderveen, MSN, APRN, AGNP-C Adult-Gerontology Nurse Practitioner [facility]

## 2024-05-11 NOTE — Progress Notes (Signed)
 Referring Provider: Zarwolo, Gloria, FNP Primary Care Physician:  Zarwolo, Gloria, FNP Primary GI Physician: Dr. Cinderella   Chief Complaint  Patient presents with   Follow-up    Patient here today for a follow up on Constipation. She says this is no better. She has started a Multi vitamin with fiber and this has helped some.  She is taking Amitiza  24 mcg once per day. She says she can only taking it three days out of a week.    HPI:   Kristina Delacruz is a 43 y.o. female with past medical history of arctic valve insufficiency, GERD, mitral valve insufficiency, anxiety, gallstone pancreatitis (s/p GB removal 11/2023)    Patient presenting today for:  Follow up of constipation  Last seen march, at that time having constipation x2 years since tubal ligation.going 1-2 weeks without a BM. Took lactulose  for a while which nauseated her. Stools soft to watery but still with the need to strain to defecate. concerned that she had a CT on 2/18 that showed gallstones despite gb removal on 11/2023. She notes on 2/16 she had an episode of abdominal pain that was generalized with pain in her back as well. She notes some nausea and vomiting, symptoms lasted approximately 2 hours and then resolved. She has had no further episodes like this   Recommended schedule colonoscopy, start movantik  12.5mg  daily (was not covered, started on amitiza ), pt to make me aware of recurrent RUQ pain/nausea, would need stat imaging and CMP  Present:  Constipation is much improved. She is doing amitiza  , she takes this Friday, Saturday and Sunday due to her work schedule. Had a BM every 1-2 days, states she did try taking tokelau daily and had the same results. No longer on oxycodone .  Stools can range from watery to harder, sometimes has to strain to go. She sometimes has to rock back and forth to defecate. No abdominal pain. She denies rectal bleeding or melena. Drinking atleast 80 oz of water  per day. She did not have good  results with mirlax, lactulose  made her sick.    Last Colonoscopy:02/2024 - The examined portion of the ileum was normal.                           - Exudate in the entire examined colon.                            Biopsied.r/o IBD                           - Non-bleeding internal hemorrhoids.                           - Biopsies were obtained in the rectum, in the                            sigmoid colon, in the descending colon, in the                            transverse colon and in the ascending colon. A. RIGHT COLON, RANDOM, BIOPSY:  Mild melanosis coli   B. LEFT COLON, RANDOM, BIOPSY:  Melanosis coli  Last Endoscopy: never    [facility]   05/11/24 1518  Weight:  180 lb 3.2 oz (81.7 kg)     Past Medical History:  Diagnosis Date   Aortic valve insufficiency    Arthritis    GERD (gastroesophageal reflux disease)    Mitral valve disorder    Stress fracture of cervical vertebra    Trichimoniasis 08/13/2021   08/13/21 treated with flagyl .    Past Surgical History:  Procedure Laterality Date   COLONOSCOPY N/A 03/02/2024   Procedure: COLONOSCOPY;  Surgeon: Cinderella Deatrice FALCON, MD;  [facility];  Service: Endoscopy;  Laterality: N/A;  1:00PM;ASA 2   DILATATION AND CURETTAGE/HYSTEROSCOPY WITH MINERVA N/A 04/24/2022   Procedure: DILATATION AND CURETTAGE/HYSTEROSCOPY WITH MINERVA;  Surgeon: Jayne Vonn DEL, MD;  Location: [facility];  Service: Gynecology;  Laterality: N/A;   ESSURE TUBAL LIGATION Bilateral    GALLBLADDER SURGERY  11/25/2023   LAPAROSCOPIC BILATERAL SALPINGECTOMY N/A 04/24/2022   Procedure: LAPAROSCOPIC BILATERAL SALPINGECTOMY; removal foreign object;  Surgeon: Jayne Vonn DEL, MD;  Location: [facility];  Service: Gynecology;  Laterality: N/A;   TUBAL LIGATION      Current Outpatient Medications  Medication Sig Dispense Refill   Bacillus Coagulans-Inulin (PROBIOTIC-PREBIOTIC PO) Take 1 capsule by mouth daily.     Cetirizine  HCl (ZYRTEC  PO) Take 1 tablet by  mouth daily.     cyclobenzaprine  (FLEXERIL ) 5 MG tablet Take 1 tablet (5 mg total) by mouth daily. 90 tablet 1   Drospirenone  (SLYND ) 4 MG TABS Take 1 tablet (4 mg total) by mouth daily. 28 tablet 12   gabapentin  (NEURONTIN ) 300 MG capsule 1 capsule daily 90 capsule 3   lubiprostone  (AMITIZA ) 24 MCG capsule TAKE 1 CAPSULE (24 MCG TOTAL) BY MOUTH 2 (TWO) TIMES DAILY WITH A MEAL. (Patient taking differently: Take 24 mcg by mouth 2 (two) times daily with a meal. Three days out of a week due to work schedule.) 180 capsule 1   metoprolol  tartrate (LOPRESSOR ) 25 MG tablet Take 1 tablet (25 mg total) by mouth 2 (two) times daily. 180 tablet 3   montelukast  (SINGULAIR ) 10 MG tablet TAKE 1 TABLET BY MOUTH EVERYDAY AT BEDTIME 90 tablet 2   Multiple Vitamin (MULTIVITAMIN) tablet Take 1 tablet by mouth daily. With fiber daily.     OVER THE COUNTER MEDICATION Biotin one at bedtime.     pantoprazole  (PROTONIX ) 40 MG tablet TAKE 1 TABLET BY MOUTH TWICE A DAY 180 tablet 2   RESTASIS  0.05 % ophthalmic emulsion Place 1 drop into both eyes 2 (two) times daily.     traZODone  (DESYREL ) 150 MG tablet TAKE 1 TABLET BY MOUTH EVERYDAY AT BEDTIME 90 tablet 3   venlafaxine  XR (EFFEXOR -XR) 75 MG 24 hr capsule TAKE 1 CAPSULE BY MOUTH DAILY WITH BREAKFAST. 90 capsule 3   No current facility-administered medications for this visit.    Allergies as of 05/11/2024 - Review Complete 05/11/2024  Allergen Reaction Noted   Bee venom Swelling 08/08/2021   Fentanyl  Nausea And Vomiting 11/22/2023   Onion Rash 08/08/2021   Tape Rash 12/23/2022    Social History   Socioeconomic History   Marital status: Single    Spouse name: Not on file   Number of children: 1   Years of education: Not on file   Highest education level: Not on file  Occupational History   Not on file  Tobacco Use   Smoking status: Former    Types: Cigarettes   Smokeless tobacco: Never  Vaping Use   Vaping status: Never Used  Substance and Sexual  Activity  Alcohol use: Not Currently   Drug use: No   Sexual activity: Not Currently    Birth control/protection: Surgical    Comment: tubal & ablation  Other Topics Concern   Not on file  Social History Narrative   Lives home alone   Social Drivers of Health   Financial Resource Strain: Low Risk  (08/08/2021)   Overall Financial Resource Strain (CARDIA)    Difficulty of Paying Living Expenses: Not very hard  Food Insecurity: No Food Insecurity (11/22/2023)   Hunger Vital Sign    Worried About Running Out of Food in the Last Year: Never true    Ran Out of Food in the Last Year: Never true  Transportation Needs: No Transportation Needs (11/22/2023)   PRAPARE - Administrator, Civil Service (Medical): No    Lack of Transportation (Non-Medical): No  Physical Activity: Sufficiently Active (08/08/2021)   Exercise Vital Sign    Days of Exercise per Week: 6 days    Minutes of Exercise per Session: 30 min  Stress: No Stress Concern Present (08/08/2021)   [employer] of Occupational Health - Occupational Stress Questionnaire    Feeling of Stress : Only a little  Social Connections: Moderately Integrated (11/22/2023)   Social Connection and Isolation Panel    Frequency of Communication with Friends and Family: More than three times a week    Frequency of Social Gatherings with Friends and Family: Once a week    Attends Religious Services: More than 4 times per year    Active Member of [employer] or Organizations: No    Attends Engineer, structural: Patient declined    Marital Status: Living with partner    Review of systems General: negative for malaise, night sweats, fever, chills, weight loss Neck: Negative for lumps, goiter, pain and significant neck swelling Resp: Negative for cough, wheezing, dyspnea at rest CV: Negative for chest pain, leg swelling, palpitations, orthopnea GI: denies melena, hematochezia, nausea, vomiting, diarrhea, dysphagia, odyonophagia,  early satiety or unintentional weight loss. +constipation  MSK: Negative for joint pain or swelling, back pain, and muscle pain. Derm: Negative for itching or rash Psych: Denies depression, anxiety, memory loss, confusion. No homicidal or suicidal ideation.  Heme: Negative for prolonged bleeding, bruising easily, and swollen nodes. Endocrine: Negative for cold or heat intolerance, polyuria, polydipsia and goiter. Neuro: negative for tremor, gait imbalance, syncope and seizures. The remainder of the review of systems is noncontributory.  Physical Exam: BP 137/86 (BP Location: Left Arm, Patient Position: Sitting, Cuff Size: Large)   Pulse 80   Temp (!) 97.1 F (36.2 C) (Temporal)   Ht 5' 2 (1.575 m)   Wt 180 lb 3.2 oz (81.7 kg)   BMI 32.96 kg/m  General:   Alert and oriented. No distress noted. Pleasant and cooperative.  Head:  Normocephalic and atraumatic. Eyes:  Conjuctiva clear without scleral icterus. Mouth:  Oral mucosa pink and moist. Good dentition. No lesions. Heart: Normal rate and rhythm, s1 and s2 heart sounds present.  Lungs: Clear lung sounds in all lobes. Respirations equal and unlabored. Abdomen:  +BS, soft, non-tender and non-distended. No rebound or guarding. No HSM or masses noted. Derm: No palmar erythema or jaundice Msk:  Symmetrical without gross deformities. Normal posture. Extremities:  Without edema. Neurologic:  Alert and  oriented x4 Psych:  Alert and cooperative. Normal mood and affect.  Invalid input(s): 6 MONTHS   ASSESSMENT: Chitara Clonch is a 43 y.o. female presenting today for follow up of  constipation  Constipation improved since stopping oxycodone , start amitiza , though she is taking only a fraction of the prescribed dose, as she is taking this 3 times per week vs. BID. She had recent colonoscopy as above that was unremarkable. Water  intake is good. We discussed importance of optimizing the medication at its prescribed dose, as she has trouble  taking it twice daily due to her work schedule, I would recommend she take it atleast once daily as she has had already had some improvement with taking it three times per week. Opiates likely influencing her constipation previously. If she continues to have issues with defecation despite compliance with her dosing of amitiza  could consider anorectal manometry.    PLAN:  -continue amitiza , recommend taking atleast once daily to try and optimize medication resuults -continue good water  intake -diet high in fruits,veggies, whole grains -pt to make me aware if constipation is not improving with daily dosing of amitiza   -consider anorectal manometry if constipation persists despite compliance with laxatives  All questions were answered, patient verbalized understanding and is in agreement with plan as outlined above.    Follow Up: 6 months   Malessa Zartman L. Genie Mirabal, MSN, APRN, AGNP-C Adult-Gerontology Nurse Practitioner [facility]

## 2024-05-22 DIAGNOSIS — Z419 Encounter for procedure for purposes other than remedying health state, unspecified: Secondary | ICD-10-CM | POA: Diagnosis not present

## 2024-06-22 DIAGNOSIS — Z419 Encounter for procedure for purposes other than remedying health state, unspecified: Secondary | ICD-10-CM | POA: Diagnosis not present

## 2024-07-13 ENCOUNTER — Encounter: Payer: Self-pay | Admitting: Adult Health

## 2024-07-13 ENCOUNTER — Other Ambulatory Visit (HOSPITAL_COMMUNITY)
Admission: RE | Admit: 2024-07-13 | Discharge: 2024-07-13 | Disposition: A | Discharge status: Home / Self Care | Source: Ambulatory Visit | Attending: Adult Health | Admitting: Adult Health

## 2024-07-13 ENCOUNTER — Ambulatory Visit: Admitting: Adult Health

## 2024-07-13 VITALS — BP 127/87 | HR 89 | Ht 62.0 in | Wt 180.0 lb

## 2024-07-13 DIAGNOSIS — Z0000 Encounter for general adult medical examination without abnormal findings: Secondary | ICD-10-CM | POA: Diagnosis not present

## 2024-07-13 DIAGNOSIS — R4589 Other symptoms and signs involving emotional state: Secondary | ICD-10-CM

## 2024-07-13 DIAGNOSIS — Z1231 Encounter for screening mammogram for malignant neoplasm of breast: Secondary | ICD-10-CM

## 2024-07-13 DIAGNOSIS — Z01419 Encounter for gynecological examination (general) (routine) without abnormal findings: Secondary | ICD-10-CM

## 2024-07-13 DIAGNOSIS — R232 Flushing: Secondary | ICD-10-CM | POA: Diagnosis not present

## 2024-07-13 NOTE — Progress Notes (Signed)
 Patient ID: Kristina Delacruz, female   DOB: Oct 12, 1981, 43 y.o.   MRN: [id number] History of Present Illness: Kristina Delacruz is a 43 year old white female,single, G1P1001 in for a well woman gyn exam and pap. No periods on Slynd  and hot flashes are better, still anxious at times, but planning her dad's wedding.  PCP is Glorai Zarwolo NP   Current Medications, Allergies, Past Medical History, Past Surgical History, Family History and Social History were reviewed in [employer] record.     Review of Systems: Patient denies any daily headaches, hearing loss, fatigue, blurred vision, shortness of breath, chest pain, abdominal pain, problems with bowel movements, urination, or intercourse(not active). No joint pain or mood swings.  No period on slynd  Hot flashes better Anxious at times    Physical Exam:BP 127/87 (BP Location: Right Arm, Patient Position: Sitting, Cuff Size: Normal)   Pulse 89   Ht 5' 2 (1.575 m)   Wt 180 lb (81.6 kg)   BMI 32.92 kg/m   General:  Well developed, well nourished, no acute distress Skin:  Warm and dry Neck:  Midline trachea, normal thyroid , good ROM, no lymphadenopathy Lungs; Clear to auscultation bilaterally Breast:  No dominant palpable mass, retraction, or nipple discharge Cardiovascular: Regular rate and rhythm Abdomen:  Soft, non tender, no hepatosplenomegaly Pelvic:  External genitalia is normal in appearance, no lesions.  The vagina is normal in appearance. Urethra has no lesions or masses. The cervix is smooth, stenotic os, pap with HR HPV genotyping performed, cervix did bleed some.  Uterus is felt to be normal size, shape, and contour.  No adnexal masses or tenderness noted.Bladder is non tender, no masses felt. Rectal:Deferred at her request Extremities/musculoskeletal:  No swelling or varicosities noted, no clubbing or cyanosis Psych:  No mood changes, alert and cooperative,seems happy AA is 0 Fall risk is low    07/13/2024    8:48  AM 04/26/2024    3:34 PM 02/10/2024    3:14 PM  Depression screen PHQ 2/9  Decreased Interest 1 1 1   Down, Depressed, Hopeless 0 1 0  PHQ - 2 Score 1 2 1   Altered sleeping 1 2 2   Tired, decreased energy 1 1 1   Change in appetite 0 0 0  Feeling bad or failure about yourself  0 0 0  Trouble concentrating 0 0 0  Moving slowly or fidgety/restless 0 0 0  Suicidal thoughts 0 0 0  PHQ-9 Score 3 5 4   Difficult doing work/chores  Somewhat difficult Somewhat difficult       07/13/2024    8:48 AM 04/26/2024    3:35 PM 02/10/2024    3:17 PM 12/16/2023    3:35 PM  GAD 7 : Generalized Anxiety Score  Nervous, Anxious, on Edge 43 1 2 1   Control/stop worrying 0 1 0 1  Worry too much - different things 1 0 1 0  Trouble relaxing 0 0 2 2  Restless 0 0 1 1  Easily annoyed or irritable 1 1 3 1   Afraid - awful might happen 0 0 0 0  Total GAD 7 Score 3 3 9 6   Anxiety Difficulty  Somewhat difficult Somewhat difficult     Upstream - 07/13/24 0845       Pregnancy Intention Screening   Does the patient want to become pregnant in the next year? No    Does the patient's partner want to become pregnant in the next year? No    Would the patient  like to discuss contraceptive options today? No      Contraception Wrap Up   Current Method Female Sterilization;Oral Contraceptive    End Method Female Sterilization;Oral Contraceptive           Examination chaperoned by Clarita Salt LPN   Impression and plan: 1. Routine general medical examination at a health care facility (Primary) Pap sent Pap in 3 years if normal Physical in 1 year Labs with PCP - Cytology - PAP( [place])  2. [place]wey Anxious at times But good on effexor  XR 75 mg 1 daily, has refills Follow up in 6 months for ROS   3. Hot flashes Better on slynd ,has refills   4. Screening mammogram for breast cancer Scheduled mammogram for 07/26/24 at 3:25 pm - MM 3D SCREENING MAMMOGRAM BILATERAL BREAST; Future

## 2024-07-15 ENCOUNTER — Ambulatory Visit: Payer: Self-pay | Admitting: Adult Health

## 2024-07-15 LAB — CYTOLOGY - PAP
Comment: NEGATIVE
Diagnosis: NEGATIVE
High risk HPV: NEGATIVE

## 2024-07-15 MED ORDER — METRONIDAZOLE 500 MG PO TABS: 500.0000 mg | ORAL_TABLET | Freq: Two times a day (BID) | ORAL | 0 refills | Status: DC

## 2024-07-23 DIAGNOSIS — Z419 Encounter for procedure for purposes other than remedying health state, unspecified: Secondary | ICD-10-CM | POA: Diagnosis not present

## 2024-07-26 ENCOUNTER — Ambulatory Visit (HOSPITAL_COMMUNITY)

## 2024-08-02 ENCOUNTER — Ambulatory Visit: Admitting: Family Medicine

## 2024-08-02 ENCOUNTER — Ambulatory Visit: Payer: Self-pay

## 2024-08-02 VITALS — BP 136/90 | HR 81 | Temp 98.2°F | Ht 62.0 in | Wt 181.4 lb

## 2024-08-02 DIAGNOSIS — Z91048 Other nonmedicinal substance allergy status: Secondary | ICD-10-CM | POA: Diagnosis not present

## 2024-08-02 DIAGNOSIS — T7840XA Allergy, unspecified, initial encounter: Secondary | ICD-10-CM

## 2024-08-02 DIAGNOSIS — L231 Allergic contact dermatitis due to adhesives: Secondary | ICD-10-CM

## 2024-08-02 MED ORDER — TRIAMCINOLONE ACETONIDE 0.1 % EX CREA: 1.0000 | TOPICAL_CREAM | Freq: Two times a day (BID) | CUTANEOUS | 0 refills | Status: AC

## 2024-08-02 NOTE — Progress Notes (Signed)
 Patient Office Visit  Assessment & Plan:  Allergic reaction, initial encounter -     Triamcinolone  Acetonide; Apply 1 Application topically 2 (two) times daily.  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 Patient hesitant about oral prednisone  due to side effects. - Prescribed steroid cream for one week or until symptoms resolve. - Advised to contact office if no improvement by mid-week for possible oral prednisone . - Continue current antihistamine regimen. - Avoid adhesive products.       Return if symptoms worsen or fail to improve.   Subjective:    Patient ID: Kristina Delacruz, female    DOB: [birth year]  Age: 43 y.o. MRN: [id number]  Chief Complaint  Patient presents with   Rash    Pt reports that she used an adhesive bra and now she is broke out on both breasts/nipples. Started Saturday evening.     Rash   Discussed the use of AI scribe software for clinical note transcription with the patient, who gave verbal consent to proceed.  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  She developed a red, itchy, and painful rash on her nipples after using adhesive covers for the first time on Saturday afternoon. The covers were worn from 2:30 PM until 11:00 PM during an indoor wedding event. Upon removal, she noticed the rash and swelling, particularly on the nipples, which were described as 'huge'.  She has tried applying coconut oil lotion, which caused burning, as well as Neosporin and hydrocortisone. She  takes an allergy pill twice daily, which she continued during this episode. Despite these measures, the rash has not improved, and the redness remains the same. The swelling of the nipples has decreased, but the rash persists without any bleeding.  She has a known allergy to plastic adhesive, which causes her to break out, and she typically uses fabric Band-Aids. She believed the adhesive covers were fabric-like, but they still caused a reaction. No breathing problems or other systemic symptoms are present.  Her sleep has been affected due to the pain, as she is a side sleeper, which exacerbates the discomfort. She has been cautious during showers to avoid aggravating the rash. She is not diabetic and has a history of taking gabapentin  for back nerve pain and trazodone  for mood stabilization and sleep. Physical Exam BREAST: Breast with redness, reduced nipple swelling, no bleeding, irritation present. 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 Patient hesitant about oral prednisone  due to side effects. - Prescribed steroid cream for one week or until symptoms resolve. - Advised to contact office if no improvement by mid-week for possible oral prednisone . - Continue current antihistamine regimen. - Avoid adhesive products.    The 10-year ASCVD risk score (Arnett DK, et al., 2019) is: 1.6%  Past Medical History:  Diagnosis Date   Allergy    Aortic valve insufficiency    Arthritis  GERD (gastroesophageal reflux disease)    Mitral valve disorder    Stress fracture of cervical vertebra    Trichimoniasis 08/13/2021   08/13/21 treated with flagyl .   Past Surgical History:  Procedure Laterality Date   COLONOSCOPY N/A 03/02/2024   Procedure: COLONOSCOPY;  Surgeon: Cinderella Deatrice FALCON, MD;  [facility];  Service: Endoscopy;   Laterality: N/A;  1:00PM;ASA 2   DILATATION AND CURETTAGE/HYSTEROSCOPY WITH MINERVA N/A 04/24/2022   Procedure: DILATATION AND CURETTAGE/HYSTEROSCOPY WITH MINERVA;  Surgeon: Jayne Vonn DEL, MD;  Location: [facility];  Service: Gynecology;  Laterality: N/A;   ESSURE TUBAL LIGATION Bilateral    GALLBLADDER SURGERY  11/25/2023   LAPAROSCOPIC BILATERAL SALPINGECTOMY N/A 04/24/2022   Procedure: LAPAROSCOPIC BILATERAL SALPINGECTOMY; removal foreign object;  Surgeon: Jayne Vonn DEL, MD;  Location: [facility];  Service: Gynecology;  Laterality: N/A;   TUBAL LIGATION     Social History   Tobacco Use   Smoking status: Former    Types: Cigarettes   Smokeless tobacco: Never  Vaping Use   Vaping status: Never Used  Substance Use Topics   Alcohol use: Not Currently   Drug use: No   Family History  Problem Relation Age of Onset   Thyroid  disease Mother    Hypertension Father        AVR   Heart murmur Father    Hypercholesterolemia Sister    Thyroid  disease Sister    ADD / ADHD Brother    Diabetes type I Daughter    Thyroid  disease Paternal Aunt    Thyroid  disease Paternal Uncle    Hypertension Maternal Grandmother    Colon cancer Paternal Grandfather    ADD / ADHD Daughter    Diabetes Daughter    Breast cancer Neg Hx    Cervical cancer Neg Hx    Allergies  Allergen Reactions   Bee Venom Swelling   Fentanyl  Nausea And Vomiting    Prefers morphine  if need be   Onion Rash   Tape Rash    Review of Systems  Skin:  Positive for rash.      Objective:    BP (!) 136/90   Pulse 81   Temp 98.2 F (36.8 C)   Ht 5' 2 (1.575 m)   Wt 181 lb 6 oz (82.3 kg)   SpO2 99%   BMI 33.17 kg/m  BP Readings from Last 3 Encounters:  08/02/24 (!) 136/90  07/13/24 127/87  05/11/24 137/86   Wt Readings from Last 3 Encounters:  08/02/24 181 lb 6 oz (82.3 kg)  07/13/24 180 lb (81.6 kg)  05/11/24 180 lb 3.2 oz (81.7 kg)    Physical Exam Vitals and nursing note reviewed.  Constitutional:       General: She is not in acute distress.    Appearance: Normal appearance.  HENT:     Head: Normocephalic.  Eyes:     Extraocular Movements: Extraocular movements intact.  Cardiovascular:     Rate and Rhythm: Normal rate and regular rhythm.     Heart sounds: Normal heart sounds.  Pulmonary:     Effort: Pulmonary effort is normal.     Breath sounds: Normal breath sounds. No wheezing.  Chest:  Breasts:    Right: Skin change and tenderness present. No nipple discharge.     Left: Skin change and tenderness present. No nipple discharge.     Comments: Breasts- Patient has redness and swelling around areola and nipple, left nipple looks more irritated.  No bleeding or drainage  noted.  It is slightly tender to palpation.  Not warm to the touch no streaking. Neurological:     General: No focal deficit present.     Mental Status: She is alert and oriented to person, place, and time.  Psychiatric:        Mood and Affect: Mood normal.        Behavior: Behavior normal.      No results found for any visits on 08/02/24.

## 2024-08-02 NOTE — Telephone Encounter (Signed)
 FYI Only or Action Required?: FYI only for provider.  Patient was last seen in primary care on 04/26/2024 by Zarwolo, Gloria, FNP.  Called Nurse Triage reporting Allergic Reaction.  Symptoms began several days ago.  Interventions attempted: OTC medications: coconut oil, neosporin cortisone cream.  Symptoms are: unchanged.  Triage Disposition: Call PCP Within 24 Hours  Patient/caregiver understands and will follow disposition?: Yes   Copied from CRM [id number]. Topic: Clinical - Medical Advice >> Aug 02, 2024 11:40 AM Kristina Delacruz wrote: Reason for CRM: Patient had to wear pasties for a wedding dress but she states she is allergic to some adhesives. When she removed the pasties she noticed blisters and a rash that is not going away. Would like to know if something can be prescribed or what else she can do to help her situation Reason for Disposition  [1] Applying cream or ointment AND [2] causes severe itch, burning or pain  Answer Assessment - Initial Assessment Questions Coconut oil, neosporin, cortisone cream.  Worsens symptoms, but did not help at all. Takes zyrtec  daily  1. APPEARANCE of RASH: What does the rash look like? (e.g., blisters, dry flaky skin, red spots, redness, sores)     Redness, blisters 2. LOCATION: Where is the rash located?      Bilateral nipples 3. NUMBER: How many spots are there?      2 blisters to right breast 4. SIZE: How big are the spots? (e.g., inches, cm; or compare to size of pinhead, tip of pen, eraser, pea)      Approximately the size of a pencil eraser 5. ONSET: When did the rash start?      Two days ago 6. ITCHING: Does the rash itch? If Yes, ask: How bad is the itch?  (Scale 0-10; or none, mild, moderate, severe)     Itching and burning 7. PAIN: Does the rash hurt? If Yes, ask: How bad is the pain?  (Scale 0-10; or none, mild, moderate, severe)     Burning and painful 8. OTHER SYMPTOMS: Do you have any other symptoms? (e.g.,  fever)     denies 9. PREGNANCY: Is there any chance you are pregnant? When was your last menstrual period?     N/a  Protocols used: Rash or Redness - Localized-A-AH

## 2024-08-02 NOTE — Telephone Encounter (Signed)
 Patient scheduled at another [facility]

## 2024-08-09 ENCOUNTER — Ambulatory Visit (HOSPITAL_COMMUNITY)
Admission: RE | Admit: 2024-08-09 | Discharge: 2024-08-09 | Disposition: A | Discharge status: Home / Self Care | Source: Ambulatory Visit | Attending: Adult Health | Admitting: Adult Health

## 2024-08-09 DIAGNOSIS — Z1231 Encounter for screening mammogram for malignant neoplasm of breast: Secondary | ICD-10-CM | POA: Diagnosis not present

## 2024-08-26 ENCOUNTER — Ambulatory Visit (INDEPENDENT_AMBULATORY_CARE_PROVIDER_SITE_OTHER): Admitting: Family Medicine

## 2024-08-26 ENCOUNTER — Encounter: Payer: Self-pay | Admitting: Family Medicine

## 2024-08-26 VITALS — BP 116/80 | HR 92 | Resp 16 | Ht 62.0 in | Wt 178.1 lb

## 2024-08-26 DIAGNOSIS — E038 Other specified hypothyroidism: Secondary | ICD-10-CM | POA: Diagnosis not present

## 2024-08-26 DIAGNOSIS — E559 Vitamin D deficiency, unspecified: Secondary | ICD-10-CM | POA: Diagnosis not present

## 2024-08-26 DIAGNOSIS — R7301 Impaired fasting glucose: Secondary | ICD-10-CM | POA: Diagnosis not present

## 2024-08-26 DIAGNOSIS — E7849 Other hyperlipidemia: Secondary | ICD-10-CM

## 2024-08-26 DIAGNOSIS — K219 Gastro-esophageal reflux disease without esophagitis: Secondary | ICD-10-CM | POA: Diagnosis not present

## 2024-08-26 DIAGNOSIS — E669 Obesity, unspecified: Secondary | ICD-10-CM | POA: Diagnosis not present

## 2024-08-26 NOTE — Progress Notes (Signed)
 Established Patient Office Visit  Subjective:  Patient ID: Kristina Delacruz, female    DOB: 1981/05/21  Age: 43 y.o. MRN: [id number]  CC:  Chief Complaint  Patient presents with   Medical Management of Chronic Issues    4 month follow up     HPI Kristina Delacruz is a 43 y.o. female with past medical history of insomnia, GERD presents for f/u of  chronic medical conditions. . For the details of today's visit, please refer to the assessment and plan.     Past Medical History:  Diagnosis Date   Allergy    Aortic valve insufficiency    Arthritis    GERD (gastroesophageal reflux disease)    Mitral valve disorder    Stress fracture of cervical vertebra    Trichimoniasis 08/13/2021   08/13/21 treated with flagyl .    Past Surgical History:  Procedure Laterality Date   COLONOSCOPY N/A 03/02/2024   Procedure: COLONOSCOPY;  Surgeon: Cinderella Deatrice FALCON, MD;  [facility];  Service: Endoscopy;  Laterality: N/A;  1:00PM;ASA 2   DILATATION AND CURETTAGE/HYSTEROSCOPY WITH MINERVA N/A 04/24/2022   Procedure: DILATATION AND CURETTAGE/HYSTEROSCOPY WITH MINERVA;  Surgeon: Jayne Vonn DEL, MD;  Location: [facility];  Service: Gynecology;  Laterality: N/A;   ESSURE TUBAL LIGATION Bilateral    GALLBLADDER SURGERY  11/25/2023   LAPAROSCOPIC BILATERAL SALPINGECTOMY N/A 04/24/2022   Procedure: LAPAROSCOPIC BILATERAL SALPINGECTOMY; removal foreign object;  Surgeon: Jayne Vonn DEL, MD;  Location: [facility];  Service: Gynecology;  Laterality: N/A;   TUBAL LIGATION      Family History  Problem Relation Age of Onset   Thyroid  disease Mother    Hypertension Father        AVR   Heart murmur Father    Hypercholesterolemia Sister    Thyroid  disease Sister    ADD / ADHD Brother    Diabetes type I Daughter    Thyroid  disease Paternal Aunt    Thyroid  disease Paternal Uncle    Hypertension Maternal Grandmother    Colon cancer Paternal Grandfather    ADD / ADHD Daughter    Diabetes Daughter    Breast  cancer Neg Hx    Cervical cancer Neg Hx     Social History   Socioeconomic History   Marital status: Single    Spouse name: Not on file   Number of children: 1   Years of education: Not on file   Highest education level: 12th grade  Occupational History   Not on file  Tobacco Use   Smoking status: Former    Types: Cigarettes   Smokeless tobacco: Never  Vaping Use   Vaping status: Never Used  Substance and Sexual Activity   Alcohol use: Not Currently   Drug use: No   Sexual activity: Not Currently    Birth control/protection: Surgical, Pill    Comment: tubal & ablation  Other Topics Concern   Not on file  Social History Narrative   Lives home alone   Social Drivers of Health   Financial Resource Strain: Medium Risk (08/25/2024)   Overall Financial Resource Strain (CARDIA)    Difficulty of Paying Living Expenses: Somewhat hard  Food Insecurity: No Food Insecurity (08/25/2024)   Hunger Vital Sign    Worried About Running Out of Food in the Last Year: Never true    Ran Out of Food in the Last Year: Never true  Transportation Needs: No Transportation Needs (08/25/2024)   PRAPARE - Transportation    Lack of  Transportation (Medical): No    Lack of Transportation (Non-Medical): No  Physical Activity: Sufficiently Active (08/25/2024)   Exercise Vital Sign    Days of Exercise per Week: 5 days    Minutes of Exercise per Session: 30 min  Stress: Stress Concern Present (08/25/2024)   [employer] of Occupational Health - Occupational Stress Questionnaire    Feeling of Stress: To some extent  Social Connections: Unknown (08/25/2024)   Social Connection and Isolation Panel    Frequency of Communication with Friends and Family: More than three times a week    Frequency of Social Gatherings with Friends and Family: Once a week    Attends Religious Services: More than 4 times per year    Active Member of [employer] or Organizations: No    Attends Engineer, structural:  Not on file    Marital Status: Patient declined  Recent Concern: Social Connections - Moderately Isolated (07/13/2024)   Social Connection and Isolation Panel    Frequency of Communication with Friends and Family: More than three times a week    Frequency of Social Gatherings with Friends and Family: Once a week    Attends Religious Services: 1 to 4 times per year    Active Member of [employer] or Organizations: No    Attends Banker Meetings: Never    Marital Status: Never married  Intimate Partner Violence: Not At Risk (07/13/2024)   Humiliation, Afraid, Rape, and Kick questionnaire    Fear of Current or Ex-Partner: No    Emotionally Abused: No    Physically Abused: No    Sexually Abused: No    Outpatient Medications Prior to Visit  Medication Sig Dispense Refill   Bacillus Coagulans-Inulin (PROBIOTIC-PREBIOTIC PO) Take 1 capsule by mouth daily.     Cetirizine  HCl (ZYRTEC  PO) Take 1 tablet by mouth daily.     cyclobenzaprine  (FLEXERIL ) 5 MG tablet Take 1 tablet (5 mg total) by mouth daily. 90 tablet 1   Drospirenone  (SLYND ) 4 MG TABS Take 1 tablet (4 mg total) by mouth daily. 28 tablet 12   gabapentin  (NEURONTIN ) 300 MG capsule 1 capsule daily 90 capsule 3   lubiprostone  (AMITIZA ) 24 MCG capsule TAKE 1 CAPSULE (24 MCG TOTAL) BY MOUTH 2 (TWO) TIMES DAILY WITH A MEAL. 180 capsule 1   metoprolol  tartrate (LOPRESSOR ) 25 MG tablet Take 1 tablet (25 mg total) by mouth 2 (two) times daily. 180 tablet 3   montelukast  (SINGULAIR ) 10 MG tablet TAKE 1 TABLET BY MOUTH EVERYDAY AT BEDTIME 90 tablet 2   Multiple Vitamin (MULTIVITAMIN) tablet Take 1 tablet by mouth daily. With fiber daily.     OVER THE COUNTER MEDICATION Biotin one at bedtime.     pantoprazole  (PROTONIX ) 40 MG tablet TAKE 1 TABLET BY MOUTH TWICE A DAY 180 tablet 2   RESTASIS  0.05 % ophthalmic emulsion Place 1 drop into both eyes 2 (two) times daily.     traZODone  (DESYREL ) 150 MG tablet TAKE 1 TABLET BY MOUTH EVERYDAY AT  BEDTIME 90 tablet 3   triamcinolone  cream (KENALOG ) 0.1 % Apply 1 Application topically 2 (two) times daily. 45 g 0   venlafaxine  XR (EFFEXOR -XR) 75 MG 24 hr capsule TAKE 1 CAPSULE BY MOUTH DAILY WITH BREAKFAST. 90 capsule 3   metroNIDAZOLE  (FLAGYL ) 500 MG tablet Take 1 tablet (500 mg total) by mouth 2 (two) times daily. (Patient not taking: Reported on 08/02/2024) 14 tablet 0   No facility-administered medications prior to visit.    Allergies  Allergen Reactions   Bee Venom Swelling   Fentanyl  Nausea And Vomiting    Prefers morphine  if need be   Onion Rash   Tape Rash    ROS Review of Systems  Constitutional:  Negative for chills and fever.  Eyes:  Negative for visual disturbance.  Respiratory:  Negative for chest tightness and shortness of breath.   Neurological:  Negative for dizziness and headaches.      Objective:    Physical Exam HENT:     Head: Normocephalic.     Mouth/Throat:     Mouth: Mucous membranes are moist.  Cardiovascular:     Rate and Rhythm: Normal rate.     Heart sounds: Normal heart sounds.  Pulmonary:     Effort: Pulmonary effort is normal.     Breath sounds: Normal breath sounds.  Neurological:     Mental Status: She is alert.     BP 116/80   Pulse 92   Resp 16   Ht 5' 2 (1.575 m)   Wt 178 lb 1.9 oz (80.8 kg)   SpO2 97%   BMI 32.58 kg/m  Wt Readings from Last 3 Encounters:  08/26/24 178 lb 1.9 oz (80.8 kg)  08/02/24 181 lb 6 oz (82.3 kg)  07/13/24 180 lb (81.6 kg)    Lab Results  Component Value Date   TSH 5.290 (H) 04/28/2024   Lab Results  Component Value Date   WBC 4.6 04/28/2024   HGB 13.0 04/28/2024   HCT 40.2 04/28/2024   MCV 90 04/28/2024   PLT 279 04/28/2024   Lab Results  Component Value Date   NA 141 04/28/2024   K 3.9 04/28/2024   CO2 19 (L) 04/28/2024   GLUCOSE 93 04/28/2024   BUN 9 04/28/2024   CREATININE 0.77 04/28/2024   BILITOT 0.4 04/28/2024   ALKPHOS 73 04/28/2024   AST 13 04/28/2024   ALT 11  04/28/2024   PROT 6.8 04/28/2024   ALBUMIN 4.1 04/28/2024   CALCIUM 9.0 04/28/2024   ANIONGAP 10 11/26/2023   EGFR 99 04/28/2024   Lab Results  Component Value Date   CHOL 202 (H) 04/28/2024   Lab Results  Component Value Date   HDL 44 04/28/2024   Lab Results  Component Value Date   LDLCALC 139 (H) 04/28/2024   Lab Results  Component Value Date   TRIG 105 04/28/2024   Lab Results  Component Value Date   CHOLHDL 4.6 (H) 04/28/2024   Lab Results  Component Value Date   HGBA1C 5.3 04/28/2024      Assessment & Plan:  Gastroesophageal reflux disease without esophagitis Assessment & Plan: Encouraged to take Protonix  40 mg twice daily Encouraged adherence to GERD diet For managing GERD, I recommend the following lifestyle changes:  Avoid Certain Foods and Drinks: Limit or eliminate coffee, chocolate, onions, peppermint, spicy foods, carbonated beverages, citrus fruits, tomatoes, garlic, alcohol, and fatty foods such as bacon, burgers, sausages, steak, fried foods, and dairy products.  Recommended Foods: Increase your intake of high-fiber foods including whole grain cereals, oatmeal, brown rice, root vegetables, and non-citrus fruits. Opt for high-protein foods and healthy fats such as avocados, olive oil, nuts, and seeds.    Obesity (BMI 30-39.9) Assessment & Plan: For optimal results with weight loss, I recommend:  Decreasing portion sizes. Reducing sugar, sodium, and carbohydrate intake, and limiting saturated fats in your diet. Increasing your fiber intake by incorporating more whole grains, fruits, and vegetables. Setting healthy goals and focusing on lowering  carbs, sugar, and fat. Increasing the variety of fruits and vegetables in your diet. Reducing soda consumption and limiting processed foods. In addition to taking your weight loss medication, engage in moderate-intensity physical activity for at least 150 minutes per week for the best results.    IFG  (impaired fasting glucose) -     Hemoglobin A1c  Vitamin D  deficiency -     VITAMIN D  25 Hydroxy (Vit-D Deficiency, Fractures)  TSH (thyroid -stimulating hormone deficiency) -     TSH + free T4  Other hyperlipidemia -     Lipid panel -     CMP14+EGFR -     CBC with Differential/Platelet  Note: This chart has been completed using Engineer, civil (consulting) software, and while attempts have been made to ensure accuracy, certain words and phrases may not be transcribed as intended.    Follow-up: Return in about 5 months (around 01/24/2025).   Spencer Peterkin  Z Bacchus, FNP

## 2024-08-26 NOTE — Assessment & Plan Note (Signed)
Encouraged to take Protonix 40 mg twice daily Encouraged adherence to GERD diet For managing GERD, I recommend the following lifestyle changes:  Avoid Certain Foods and Drinks: Limit or eliminate coffee, chocolate, onions, peppermint, spicy foods, carbonated beverages, citrus fruits, tomatoes, garlic, alcohol, and fatty foods such as bacon, burgers, sausages, steak, fried foods, and dairy products.  Recommended Foods: Increase your intake of high-fiber foods including whole grain cereals, oatmeal, brown rice, root vegetables, and non-citrus fruits. Opt for high-protein foods and healthy fats such as avocados, olive oil, nuts, and seeds.

## 2024-08-26 NOTE — Patient Instructions (Signed)
 I appreciate the opportunity to provide care to you today!    Follow up:  5 months  Labs: please stop by the lab today to get your blood drawn (CBC, CMP, TSH, Lipid profile, HgA1c, Vit D)  For a Healthier YOU, I Recommend: Reducing your intake of sugar, sodium, carbohydrates, and saturated fats. Increasing your fiber intake by incorporating more whole grains, fruits, and vegetables into your meals. Setting healthy goals with a focus on lowering your consumption of carbs, sugar, and unhealthy fats. Adding variety to your diet by including a wide range of fruits and vegetables. Cutting back on soda and limiting processed foods as much as possible. Staying active: In addition to taking your weight loss medication, aim for at least 150 minutes of moderate-intensity physical activity each week for optimal results.    Here are some foods to avoid or reduce in your diet to help manage cholesterol levels:  Fried Foods:Deep-fried items such as french fries, fried chicken, and fried snacks are high in unhealthy fats and can raise LDL (bad) cholesterol levels. Processed Meats:Foods like bacon, sausage, hot dogs, and deli meats are often high in saturated fat and cholesterol. Full-Fat Dairy Products:Whole milk, full-fat yogurt, butter, cream, and cheese are rich in saturated fats, which can increase cholesterol levels. Baked Goods and Sweets:Pastries, cakes, cookies, and donuts often contain trans fats and added sugars, which can raise LDL cholesterol and lower HDL (good) cholesterol. Red Meat:Beef, lamb, and pork are high in saturated fat. Lean cuts or plant-based protein alternatives are better options. Lard and Shortening:Used in some baked goods, lard and shortening are high in trans fats and should be avoided. Fast Food:Many fast food items are cooked with unhealthy oils and contain high amounts of saturated and trans fats. Processed Snacks:Chips, crackers, and certain microwave popcorns can  contain trans fats and high levels of unhealthy oils. Shellfish:While nutritious in other ways, some shellfish like shrimp, lobster, and crab are high in cholesterol. They should be consumed in moderation. Coconut and Palm Oils:these oils are high in saturated fat and can raise cholesterol levels when used in cooking or baking.     Please follow up if your symptoms worsen or fail to improve.    Please continue to a heart-healthy diet and increase your physical activities. Try to exercise for at least five days a week.    It was a pleasure to see you and I look forward to continuing to work together on your health and well-being. Please do not hesitate to call the office if you need care or have questions about your care.  In case of emergency, please visit the Emergency Department for urgent care, or contact our clinic at [phone] to schedule an appointment. We're here to help you!   Have a wonderful day and week. With Gratitude, Meade JENEANE Gerlach MSN, FNP-BC, PMHNP-BC

## 2024-08-26 NOTE — Assessment & Plan Note (Signed)
 For optimal results with weight loss, I recommend: Decreasing portion sizes. Reducing sugar, sodium, and carbohydrate intake, and limiting saturated fats in your diet. Increasing your fiber intake by incorporating more whole grains, fruits, and vegetables. Setting healthy goals and focusing on lowering carbs, sugar, and fat. Increasing the variety of fruits and vegetables in your diet. Reducing soda consumption and limiting processed foods. In addition to taking your weight loss medication, engage in moderate-intensity physical activity for at least 150 minutes per week for the best results.

## 2024-08-30 DIAGNOSIS — E038 Other specified hypothyroidism: Secondary | ICD-10-CM | POA: Diagnosis not present

## 2024-08-30 DIAGNOSIS — E7849 Other hyperlipidemia: Secondary | ICD-10-CM | POA: Diagnosis not present

## 2024-08-30 DIAGNOSIS — E559 Vitamin D deficiency, unspecified: Secondary | ICD-10-CM | POA: Diagnosis not present

## 2024-08-30 DIAGNOSIS — R7301 Impaired fasting glucose: Secondary | ICD-10-CM | POA: Diagnosis not present

## 2024-08-31 LAB — TSH+FREE T4
Free T4: 1.21 ng/dL (ref 0.82–1.77)
TSH: 4.75 u[IU]/mL — ABNORMAL HIGH (ref 0.450–4.500)

## 2024-08-31 LAB — LIPID PANEL
Chol/HDL Ratio: 4.5 ratio — ABNORMAL HIGH (ref 0.0–4.4)
Cholesterol, Total: 202 mg/dL — ABNORMAL HIGH (ref 100–199)
HDL: 45 mg/dL (ref >39)
LDL Chol Calc (NIH): 135 mg/dL — ABNORMAL HIGH (ref 0–99)
Triglycerides: 121 mg/dL (ref 0–149)
VLDL Cholesterol Cal: 22 mg/dL (ref 5–40)

## 2024-08-31 LAB — CBC WITH DIFFERENTIAL/PLATELET
Basophils Absolute: 0 x10E3/uL (ref 0.0–0.2)
Basos: 1 %
EOS (ABSOLUTE): 0.1 x10E3/uL (ref 0.0–0.4)
Eos: 3 %
Hematocrit: 39.3 % (ref 34.0–46.6)
Hemoglobin: 12.7 g/dL (ref 11.1–15.9)
Immature Grans (Abs): 0 x10E3/uL (ref 0.0–0.1)
Immature Granulocytes: 0 %
Lymphocytes Absolute: 2 x10E3/uL (ref 0.7–3.1)
Lymphs: 40 %
MCH: 29.2 pg (ref 26.6–33.0)
MCHC: 32.3 g/dL (ref 31.5–35.7)
MCV: 90 fL (ref 79–97)
Monocytes Absolute: 0.4 x10E3/uL (ref 0.1–0.9)
Monocytes: 7 %
Neutrophils Absolute: 2.6 x10E3/uL (ref 1.4–7.0)
Neutrophils: 49 %
Platelets: 282 x10E3/uL (ref 150–450)
RBC: 4.35 x10E6/uL (ref 3.77–5.28)
RDW: 13.3 % (ref 11.7–15.4)
WBC: 5.1 x10E3/uL (ref 3.4–10.8)

## 2024-08-31 LAB — CMP14+EGFR
ALT: 14 IU/L (ref 0–32)
AST: 17 IU/L (ref 0–40)
Albumin: 4.1 g/dL (ref 3.9–4.9)
Alkaline Phosphatase: 78 IU/L (ref 41–116)
BUN/Creatinine Ratio: 13 (ref 9–23)
BUN: 12 mg/dL (ref 6–24)
Bilirubin Total: 0.4 mg/dL (ref 0.0–1.2)
CO2: 19 mmol/L — ABNORMAL LOW (ref 20–29)
Calcium: 9.3 mg/dL (ref 8.7–10.2)
Chloride: 109 mmol/L — ABNORMAL HIGH (ref 96–106)
Creatinine, Ser: 0.9 mg/dL (ref 0.57–1.00)
Globulin, Total: 2.9 g/dL (ref 1.5–4.5)
Glucose: 91 mg/dL (ref 70–99)
Potassium: 3.8 mmol/L (ref 3.5–5.2)
Sodium: 141 mmol/L (ref 134–144)
Total Protein: 7 g/dL (ref 6.0–8.5)
eGFR: 81 mL/min/1.73 (ref >59)

## 2024-08-31 LAB — HEMOGLOBIN A1C
Est. average glucose Bld gHb Est-mCnc: 105 mg/dL
Hgb A1c MFr Bld: 5.3 % (ref 4.8–5.6)

## 2024-08-31 LAB — VITAMIN D 25 HYDROXY (VIT D DEFICIENCY, FRACTURES): Vit D, 25-Hydroxy: 35.6 ng/mL (ref 30.0–100.0)

## 2024-09-28 ENCOUNTER — Other Ambulatory Visit: Payer: Self-pay | Admitting: Internal Medicine

## 2024-10-04 ENCOUNTER — Other Ambulatory Visit: Payer: Self-pay | Admitting: Family Medicine

## 2024-10-04 DIAGNOSIS — K219 Gastro-esophageal reflux disease without esophagitis: Secondary | ICD-10-CM

## 2024-10-04 DIAGNOSIS — J3089 Other allergic rhinitis: Secondary | ICD-10-CM

## 2024-10-04 MED ORDER — PANTOPRAZOLE SODIUM 40 MG PO TBEC: 40.0000 mg | DELAYED_RELEASE_TABLET | Freq: Two times a day (BID) | ORAL | 2 refills | Status: AC

## 2024-10-04 MED ORDER — MONTELUKAST SODIUM 10 MG PO TABS: 10.0000 mg | ORAL_TABLET | Freq: Every day | ORAL | 2 refills | Status: AC

## 2024-10-04 NOTE — Telephone Encounter (Signed)
 Copied from CRM #[id number]. Topic: Clinical - Medication Refill >> Oct 04, 2024  8:48 AM Lonell PEDLAR wrote: Medication:  montelukast  (SINGULAIR ) 10 MG tablet pantoprazole  (PROTONIX ) 40 MG tablet  Has the patient contacted their pharmacy? Yes, advised to contact pcp  This is the patient's preferred pharmacy:  [id number] - [place], [place] - [address] [facility] [address] [place] [address] Phone: [phone] Fax: [phone]  Is this the correct pharmacy for this prescription? Yes If no, delete pharmacy and type the correct one.   Has the prescription been filled recently? Yes  Is the patient out of the medication? Yes  Has the patient been seen for an appointment in the last year OR does the patient have an upcoming appointment? Yes  Can we respond through MyChart? Yes  Patient is going out of town tomorrow, please prioritze  Agent: Please be advised that Rx refills may take up to 3 business days. We ask that you follow-up with your pharmacy.

## 2024-10-05 ENCOUNTER — Encounter (INDEPENDENT_AMBULATORY_CARE_PROVIDER_SITE_OTHER): Payer: Self-pay | Admitting: Gastroenterology

## 2024-10-15 ENCOUNTER — Ambulatory Visit: Admitting: Family Medicine

## 2024-10-15 ENCOUNTER — Ambulatory Visit: Payer: Self-pay

## 2024-10-15 ENCOUNTER — Encounter: Payer: Self-pay | Admitting: Family Medicine

## 2024-10-15 VITALS — BP 118/68 | HR 77 | Temp 97.0°F | Ht 62.0 in | Wt 183.0 lb

## 2024-10-15 DIAGNOSIS — J0100 Acute maxillary sinusitis, unspecified: Secondary | ICD-10-CM | POA: Diagnosis not present

## 2024-10-15 DIAGNOSIS — K582 Mixed irritable bowel syndrome: Secondary | ICD-10-CM | POA: Diagnosis not present

## 2024-10-15 MED ORDER — AMOXICILLIN 500 MG PO CAPS: 500.0000 mg | ORAL_CAPSULE | Freq: Two times a day (BID) | ORAL | 0 refills | Status: AC

## 2024-10-15 NOTE — Progress Notes (Signed)
 BP 118/68   Pulse 77   Temp (!) 97 F (36.1 C)   Ht 5' 2 (1.575 m)   Wt 183 lb (83 kg)   SpO2 99%   BMI 33.47 kg/m    Subjective:   Patient ID: Kristina Delacruz, female    DOB: May 11, 1981, 43 y.o.   MRN: [id number]  HPI: Kristina Delacruz is a 43 y.o. female presenting on 10/15/2024 for URI   Discussed the use of AI scribe software for clinical note transcription with the patient, who gave verbal consent to proceed.  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  Acute diarrhea - Onset Saturday morning after returning from [place]  - Frequency of four to five episodes per day - Diarrhea occurs primarily about thirty minutes after eating - No blood in stool - Diarrhea is not constant, mainly postprandial - Stopped usual medication for bowel movements on Sunday to avoid dehydration - Suspects local food as a possible trigger  Sinonasal symptoms - Onset Monday - Constant headache - Epistaxis present - Pressure in left eye with associated dizziness and transient visual disturbances - Taking two allergy medications - Avoided additional medications to prevent further nasal dryness - No cough, wheezing, or shortness of breath      Relevant past medical, surgical, family and social history reviewed and updated as indicated. Interim medical history since our last visit reviewed. Allergies and medications reviewed and updated.  Review of Systems  Constitutional:  Negative for chills and fever.  HENT:  Positive for postnasal drip, rhinorrhea, sinus pressure and sore throat. Negative for congestion, ear discharge, ear pain and sneezing.   Eyes:  Negative for pain, redness and visual disturbance.  Respiratory:  Negative for chest tightness and shortness of breath.   Cardiovascular:  Negative for chest pain and leg swelling.  Gastrointestinal:  Positive for diarrhea. Negative for  abdominal distention, abdominal pain, nausea and vomiting.  Genitourinary:  Negative for difficulty urinating and dysuria.  Musculoskeletal:  Negative for back pain and gait problem.  Skin:  Negative for rash.  Neurological:  Negative for light-headedness and headaches.  Psychiatric/Behavioral:  Negative for agitation and behavioral problems.   All other systems reviewed and are negative.   Per HPI unless specifically indicated above   Allergies as of 10/15/2024       Reactions   Bee Venom Swelling   Fentanyl  Nausea And Vomiting   Prefers morphine  if need be   Onion Rash   Tape Rash        Medication List        Accurate as of October 15, 2024  2:59 PM. If you have any questions, ask your nurse or doctor.          STOP taking these medications    metroNIDAZOLE  500 MG tablet Commonly known as: FLAGYL  Stopped by: Fonda LABOR Jazara Swiney       TAKE these medications    amoxicillin  500 MG capsule Commonly known as: AMOXIL  Take 1 capsule (500 mg total) by mouth 2 (two) times daily. Started by: Fonda LABOR Dyllan Hughett   cyclobenzaprine  5 MG tablet Commonly known as: FLEXERIL  Take 1 tablet (5 mg total) by mouth daily.   gabapentin  300 MG capsule Commonly known as: NEURONTIN  1 capsule daily   lubiprostone  24 MCG capsule Commonly known as: AMITIZA  TAKE 1 CAPSULE (24 MCG TOTAL) BY MOUTH 2 (TWO) TIMES DAILY WITH A  MEAL.   metoprolol  tartrate 25 MG tablet Commonly known as: LOPRESSOR  TAKE 1 TABLET BY MOUTH TWICE A DAY   montelukast  10 MG tablet Commonly known as: SINGULAIR  Take 1 tablet (10 mg total) by mouth at bedtime.   multivitamin tablet Take 1 tablet by mouth daily. With fiber daily.   OVER THE COUNTER MEDICATION Biotin one at bedtime.   pantoprazole  40 MG tablet Commonly known as: PROTONIX  Take 1 tablet (40 mg total) by mouth 2 (two) times daily.   PROBIOTIC-PREBIOTIC PO Take 1 capsule by mouth daily.   Restasis  0.05 % ophthalmic emulsion Generic  drug: cycloSPORINE  Place 1 drop into both eyes 2 (two) times daily.   Slynd  4 MG Tabs Generic drug: Drospirenone  Take 1 tablet (4 mg total) by mouth daily.   traZODone  150 MG tablet Commonly known as: DESYREL  TAKE 1 TABLET BY MOUTH EVERYDAY AT BEDTIME   triamcinolone  cream 0.1 % Commonly known as: KENALOG  Apply 1 Application topically 2 (two) times daily.   venlafaxine  XR 75 MG 24 hr capsule Commonly known as: EFFEXOR -XR TAKE 1 CAPSULE BY MOUTH DAILY WITH BREAKFAST.   ZYRTEC  PO Take 1 tablet by mouth daily.         Objective:   BP 118/68   Pulse 77   Temp (!) 97 F (36.1 C)   Ht 5' 2 (1.575 m)   Wt 183 lb (83 kg)   SpO2 99%   BMI 33.47 kg/m   Wt Readings from Last 3 Encounters:  10/15/24 183 lb (83 kg)  08/26/24 178 lb 1.9 oz (80.8 kg)  08/02/24 181 lb 6 oz (82.3 kg)    Physical Exam Vitals and nursing note reviewed.  Abdominal:     General: Abdomen is flat. Bowel sounds are normal. There is no distension.     Tenderness: There is no abdominal tenderness.     Hernia: No hernia is present.    Physical Exam   HEENT: Left sinus tenderness. CHEST: Lungs clear to auscultation bilaterally. CARDIOVASCULAR: Regular rate and rhythm, no murmurs.         Assessment & Plan:   Problem List Items Addressed This Visit   None Visit Diagnoses       Acute non-recurrent maxillary sinusitis    -  Primary   Relevant Medications   amoxicillin  (AMOXIL ) 500 MG capsule     Irritable bowel syndrome with both constipation and diarrhea              Acute sinus infection with associated diarrhea Acute sinus infection likely causing diarrhea. Discussed antibiotic-related diarrhea risk. - Prescribed amoxicillin . - Advised BRAT diet for diarrhea management. - Recommended saline nasal spray. - Encouraged hydration with Pedialyte or Gatorade.          Follow up plan: Return if symptoms worsen or fail to improve.  Counseling provided for all of the vaccine  components No orders of the defined types were placed in this encounter.   Fonda Levins, MD [facility] 10/15/2024, 2:59 PM

## 2024-10-15 NOTE — Telephone Encounter (Signed)
 FYI Only or Action Required?: FYI only for provider: appointment scheduled on 10/15/24.  Patient was last seen in primary care on 08/26/2024 by Edman Meade PEDLAR, FNP.  Called Nurse Triage reporting Headache.  Symptoms began several days ago.  Interventions attempted: OTC medications: tylenol , allergy med.  Symptoms are: unchanged.  Triage Disposition: See Physician Within 24 Hours  Patient/caregiver understands and will follow disposition?: Yes  Copied from CRM #[id number]. Topic: Clinical - Red Word Triage >> Oct 15, 2024  9:36 AM Kristina Delacruz wrote: Red Word that prompted transfer to Nurse Triage: Head congestion, left nose bleed when blowing, green mucus when blowing, worst headache. Reason for Disposition  [1] MODERATE headache (e.g., interferes with normal activities) AND [2] present > 24 hours AND [3] unexplained  (Exceptions: Pain medicines not tried, typical migraine, or headache part of viral illness.)  Answer Assessment - Initial Assessment Questions No available appts with pcp. Scheduled with alternative provider, 10/15/24.  Advised ED/911 if symptoms worsen. Patient verbalized understanding.  Patient currently at work 1. LOCATION: Where does it hurt?      frontal 2. ONSET: When did the headache start? (e.g., minutes, hours, days)      Friday 3. PATTERN: Does the pain come and go, or has it been constant since it started?     constant 4. SEVERITY: How bad is the pain? and What does it keep you from doing?  (e.g., Scale 1-10; mild, moderate, or severe)     8/10; tylenol  not helpful 5. RECURRENT SYMPTOM: Have you ever had headaches before? If Yes, ask: When was the last time? and What happened that time?      no 6. CAUSE: What do you think is causing the headache?     Sinus congestion; grandchildren sick 7. MIGRAINE: Have you been diagnosed with migraine headaches? If Yes, ask: Is this headache similar?      Yes; no medications 8. HEAD INJURY: Has  there been any recent injury to your head?      no 9. OTHER SYMPTOMS: Do you have any other symptoms? (e.g., fever, stiff neck, eye pain, sore throat, cold symptoms)     Diarrhea; 24 hours; 5x, loose and watery, yellowish brownish Allergy meds not working; nasal dryness left, streaks of blood, blood clots size of strawberry seeds, right nasal draining green to clear, nasal congestion, sore throat  Denies coughing, fever chills, n/v,dizziness Able to eat and drink, no problems with urination  Protocols used: Headache-A-AH

## 2024-11-06 ENCOUNTER — Ambulatory Visit: Payer: Self-pay | Admitting: Family Medicine

## 2024-11-08 ENCOUNTER — Encounter: Payer: Self-pay | Admitting: Family Medicine

## 2024-11-08 ENCOUNTER — Other Ambulatory Visit: Payer: Self-pay | Admitting: Family Medicine

## 2024-11-08 DIAGNOSIS — F321 Major depressive disorder, single episode, moderate: Secondary | ICD-10-CM

## 2024-11-08 MED ORDER — TRAZODONE HCL 150 MG PO TABS: 150.0000 mg | ORAL_TABLET | Freq: Every day | ORAL | 3 refills | Status: AC

## 2024-11-08 NOTE — Telephone Encounter (Signed)
"  Rx sent to the pharmacy  "

## 2025-01-10 ENCOUNTER — Ambulatory Visit: Admitting: Adult Health

## 2025-01-24 ENCOUNTER — Ambulatory Visit: Admitting: Internal Medicine

## 2025-02-02 ENCOUNTER — Ambulatory Visit: Admitting: Internal Medicine
